# Patient Record
Sex: Male | Born: 1980 | Hispanic: Refuse to answer | Marital: Married
Health system: Midwestern US, Community
[De-identification: ages and names within clinical notes are randomized; demographics above are authoritative.]

## PROBLEM LIST (undated history)

## (undated) DIAGNOSIS — R569 Unspecified convulsions: Secondary | ICD-10-CM

## (undated) DIAGNOSIS — F1290 Cannabis use, unspecified, uncomplicated: Secondary | ICD-10-CM

---
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
## 2003-05-18 NOTE — ED Provider Notes (Signed)
[facility]                      EMERGENCY DEPARTMENT TREATMENT REPORT   ADMISSION   NAME:  Nicholas Cummings, Nicholas Cummings                      PT. LOCATION:     ER  WR16   MR #:         BILLING #: [id number]          DOS: 05/18/2003   TIME: 4:54 P   [id number]   cc:   Primary Physician:   CHIEF COMPLAINT:  Seizure.   HISTORY OF PRESENT ILLNESS:  This is a 23 year old male who presents after   having a seizure.  The patient was at girlfriend's house, initially had   just some twitching of the jaw and then had a grand mal seizure with biting   of the tongue.  Brought by paramedics, 5 mg of Valium IV given.  Had a   recurrence of the seizure and repeat 5 mg IV Valium given, total of 10 mg.   The patient vomited once at home earlier today and was complaining of a   headache.  No other complaints per family.   REVIEW OF SYSTEMS:   Unobtainable from the patient.  History from family.   CONSTITUTIONAL:  Positive fever.   RESPIRATORY:  No cough.   GASTROINTESTINAL:  Positive vomiting, no complaints of any abdominal pain.   NEUROLOGICAL:  Positive headache.   ENT:   Positive biting of the tongue.   All other systems negative.   PAST MEDICAL HISTORY:  Seizure disorder since brain surgery 2 years ago,   also history of skull fracture a month ago after a seizure, history of   prior renal failure a few months ago.   MEDICATIONS:  Dilantin 300 mg every other day and 200 mg every other day,   folic acid.  Mother states not always compliant with Dilantin.   ALLERGIES:  Unknown.   SOCIAL HISTORY:  Noncontributory.   PHYSICAL EXAMINATION:   VITAL SIGNS:  Blood pressure 159/67, pulse 138, respirations 40,   temperature 102.5, O2 saturation 100%.   GENERAL:  Well-developed, well-nourished male, snoring with nasal tube.   HEENT:  Eyes:  Conjunctivae clear, lids normal.  Pupils equal, symmetrical,   and normally reactive.   Blood on tongue.   NECK:   No masses palpable, no lymphadenopathy.    RESPIRATORY:  Clear and equal breath sounds.  No respiratory distress,   tachypnea, or accessory muscle use.   CARDIOVASCULAR:  Heart regular, without murmurs, gallops, rubs, or thrills.   PMI not displaced.   GI:  Abdomen soft, nontender, without complaint of pain to palpation.  No   hepatomegaly or splenomegaly.   SKIN:  No rash.   NEUROLOGICAL:  The patient unresponsive at this time, snoring   respirations.   CONTINUATION BY DR. Manson Passey:   IMPRESSION/MANAGEMENT PLAN:  As an acute illness posing a potential threat   to life or bodily function, this is a high-risk presentation necessitating   an immediate diagnostic evaluation.   The patient with a grand mal seizure   today and has history of evidently noncompliance.  He is not really   responsive.  He has a high fever.  Concern would certainly be sepsis.  A   fever of 103 seems high simply for a seizure.  We will attempt to get  records from [facility].   DIAGNOSTIC TESTING:  Chest x-ray read as possible right upper lobe   pneumonia.  He was started on Levaquin 500 mg IV.  His Dilantin level was   6.5.  I have loaded him with 500 mg Dilantin IV.  He has had no further   seizures.  White count was elevated around 25,000.  His electrolytes looked   okay.  BUN 5, creatinine 1.1.  Urine was clear.  Head CT showed thickening   of the sinuses but no obvious sign of subdural hematoma or abscess.   COURSE IN THE EMERGENCY DEPARTMENT:  I have spoken at great length with the   hospitalists.  They will be admitting the patient.  He will be going to the   Intensive Care Unit.  I have spoken with the mother, as well.  She states   it usually takes him a day and a half or so after a seizure to return to   his normal status.   PROCEDURE:  Critical care time including direct patient care, reviewing   medical record, evaluating results of diagnostic testing, discussions with   family members, and consulting with physicians:  40 minutes.   DIAGNOSES:          1.   Status epilepticus.      2. Right upper lobe pneumonia with high fever.      3. 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      4. Subtherapeutic Dilantin level.   DISPOSITION:  The patient admitted in serious condition.   Electronically Signed By:   Docia Furl, M.D. 05/23/2003 17:06   ____________________________   Docia Furl, M.D.   cd/jj  D:  05/18/2003  T:  05/18/2003  5:38 P   [id number]   DIANA A HOULE, PA-C

## 2003-05-18 NOTE — H&P (Signed)
[facility]                              HISTORY AND PHYSICAL                             Lisbeth Ply, M.D.   NAME:    Nicholas Cummings, Nicholas Cummings   MR #:    [id number]                    ADM DATE:        05/18/2003   BILLING  [id number]                   PT. LOCATION     ER  [id number]   #:   SS #     [id number]   Lisbeth Ply, M.D.   cc:    Lisbeth Ply, M.D.   Cheri Kearns TO:   Leanor Rubenstein, M.D.-[facility]   CHIEF COMPLAINT:     The patient has seizure at home and is currently   posterior ictal.   The majority of history is supplied by discussion with   his mom over the phone.   HISTORY OF PRESENT ILLNESS:   This is a 23 year old African-American male   with history of seizure disorder who has been noncompliant in the past with   multiple hospitalizations at [facility] because of   this reason.   He has a history of brain surgery in 2002 in [place] at   [facility].   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   Since then he has had   multiple seizures due to this, however, according to his mom patient has   been in denial and is noncompliant with his medication.   He is supposed to   be taking Dilantin 100 mg 3 times daily every other day according to his   mom.   There is a known history of meningitis in the past for which was   admitted to [facility] in June of 2004.    Upon unit   arrival the patient was noted to be post ictal.   He had required Valium   due to an acute onset of seizure which lasted for approximately 30-40   seconds, subsequently had a generalized seizure 2 minutes after requiring   multiple vague incision.    Subsequently a seizure was stopped after repeat   dose of Valium 5 mg IV.    The patient was therefore brought into the    hospital for further inpatient assessment and evaluation.   ER did CT scan   of the head which showed numerous skull fractures which may represent   craniotomy defect of the hardware in place and multiple fracture sites.   No acute underlying brain abnormalities seen at this time.     Extensive   opacification of the paranasal sinuses suggesting inflammatory disease or   question hematoma.   According to his Mom the patient has a recent history   of a fall resulting in a multiple fracture with was evaluated at [facility] most recently in January of 2005.   PAST MEDICAL HISTORY:  As stated above.   PAST SURGICAL HISTORY:    As stated above.   SOCIAL HISTORY:   The patient had a remote history of marijuana use,   however mom is unclear about whether he continues to do this or whether he   drinks or smokes.   Previously has been told to abstain from alcohol.   FAMILY HISTORY:   Noncontributory.   REVIEW OF SYSTEMS:   Currently not obtainable from the patient.   CURRENT MEDICATIONS:    The patient had been placed on Dilantin before   however his levels here is subtherapeutic.   ALLERGIES:    No known drug allergies.   PHYSICAL EXAMINATION:    VITAL SIGNS:   Temperature 102.5, respiratory rate   initially 40, pulse 138, blood pressure 159/67.   GENERAL:   The patient is a well-developed, well-nourished currently   somnolent but easily arousable in answering yes or no.    Does not remember   what happened this afternoon.   HEENT:   Pupils are equal and reactive.   Extraocular movements are intact.   Anicteric sclerae.   No ___  hemorrhage.    Nasal trumpet is in place in   the left naris.    Oropharynx not well visualized.   Dried blood was noted   on his teeth as well as on his lips.   NECK:   Is supple without a limited range of motion and no nuchal rigidity   noted.    No adenopathy, thyromegaly, carotid bruits.   CARDIOVASCULAR:   Regular rate and rhythm, mildly tachycardic.   No    murmurs, rubs or gallops.   CHEST:   Is clear to auscultation bilaterally without rhonchi, rubs or   wheezes.   ABDOMEN:   Soft, nontender, nondistended, no guarding or rebound.   EXTREMITIES:   Without peripheral cyanosis, clubbing or edema.   Distal   pulses are intact.   NEUROLOGIC:   The patient is moving all 4 extremities without difficulty.   Muscular strength in upper extremities 5/5.    Lower extremities   symmetrical now.   Muscle strength:   Patient had no difficulty moving around.   Overall   limited examination.   PERTINENT LABORATORY DATA:   Dilantin level is 6.3, sodium 137, potassium   3.5, chloride 103, bicarbonate 21, glucose 106, BUN 5, creatinine 1.1,   calcium 9.0, WBC 24.6, hemoglobin 12.7, hematocrit 36.8, platelet 288.   Urinalysis negative for nitrite, leukocytesterase, protein or glucose.   CT   scan results as above.   Chest x-ray suggestive of hazy density in the   right middle apex.    Atelectasis was not excluded or pneumonia could not   be entirely excluded according to radiologist dictation.    Based on his CT   scan findings according to the radiologist's dictation edema from trauma   possibly even early middle cerebral artery territory infarct on the right   had been completely excluded correlation with clinical data.   IMPRESSION:   1.      Acute seizure episode with known history of recurrent seizure   secondary to medication noncompliance.   Patient with prior history of   brain surgery as described above after which he has recurrent seizures.   He appears to be noncompliant and also in denial.    Currently he is fully   awakening from post ictal state.      2. History of brain surgery undergoing  extensive surgery in 2002.      3. Leukocytosis.   Suspect this is large acute phase reaction from        seizure doubt this is related to any acute infection,  however will        obtain cultures of urine as well as no cultures.   Doubt meningitis as         source of his seizure.   The patient has no nuchal rigidity.        Negative Brudzinski or Kernig sign.   PLAN:   The patient will be monitored in the ICU for now.   Continue with   hydration.   He has received a dose of Dilantin 500 mg IV.   Will repeat.   Will continue IV Dilantin, recheck his levels in the morning.   He is to   receive IV Levaquin x 1 dose in the ER which will be continued for now   empirically.   His electrolytes will be checked and repleted if necessary.   Also check urine drug screen p.r.n.   His case and condition was discussed   with his mom over the phone in great detail.    Continue supportive care.   Total critical time spent was close to 45-50 minutes.   Electronically Signed By:   Lisbeth Ply, M.D. 05/19/2003 11:28   ____________________________   Lisbeth Ply, M.D.   eb  D:  05/18/2003  T:  05/18/2003  8:06 P   [id number]

## 2003-05-20 NOTE — Consults (Signed)
[facility]                               CONSULTATION REPORT                        CONSULTANT:  Vladimir Crofts, M.D.   NAMEWendelyn Cummings   BILLING #:  [id number]             DATE OF CONSULT:     05/20/2003   MR #:       [id number]              ADM DATE:            05/18/2003   SS #        [id number]           PT. LOCATION:        [id number]   ATTENDING:  Lisbeth Ply, M.D.   cc:    Lisbeth Ply, M.D.          Vladimir Crofts, M.D.          Lorain Childes, M.D.   REASON FOR CONSULTATION:   A neurological consultation has been requested   by Dr. Threasa Heads for evaluation of seizure disorder.   Chart reviewed and patient examined.  Patient is somnolent and unable to   give any coherent and relevant history.   HISTORY OF PRESENT ILLNESS:   This is a 23 year old African-American male   who has a history of seizure disorder with a strong history of   noncompliance and apparently has had multiple admissions to [facility] for the same reason.  He has a history of craniotomy in   2002 at [facility] in [place] for unclear reasons and   apparently it was a very involved and long surgery requiring about 18   hours.  He also has a history of "meningitis" in June 2004 treated at   [facility].  He apparently had a witnessed seizure at   home and was brought into the emergency room in the postictal state.  He   received some Ativan for recurrent seizure activity.  His Dilantin level   was noted to be subtherapeutic at 6.3.  Drug screen was positive for THC.   Subsequently, he has been loaded with Dilantin.  There has been no   recurrence of any seizures but he has been agitated and received Haldol.   This morning, he has been lethargic to somnolent but arousable.   PAST MEDICAL HISTORY:   Otherwise is unremarkable.   FAMILY HISTORY:    Apparently noncontributory.    SOCIAL HISTORY:   Apparently is positive for marijuana use.   REVIEW OF SYSTEMS:   Cannot be obtained.   PHYSICAL EXAMINATION:  Reveals him to be lethargic to somnolent but   arousable.  There was no obvious aphasia or dysarthria.  He did follow   simple commands.   HEENT:  Head was normocephalic with craniotomy scar.   NECK:  No rigidity.   NEUROLOGIC:  Cranial nerves II-XII intact.  Motor examination did not   reveal any focal atrophy or other weakness.  Deep tendon reflexes were felt   to be symmetrical.  There were no involuntary movements.  He did respond to   pain bilaterally.  Babinski sign was  probably absent bilaterally.   PERTINENT DIAGNOSTIC STUDIES:  CT scan of the brain has shown multiple   craniotomy defects and questionable skull fractures and frontal   encephalomalacia without any acute intracranial lesion.   Dilantin level today is 27.  Electrolytes are unremarkable with the   exception of serum sodium of 131.  Serum calcium has ranged from 9.0 to 9.4   and magnesium is 1.7.  Creatinine is 1.1 and 1.2.  WBC count is elevated at   24,600.   IMPRESSION:      1. Seizure secondary to suprapubic Dilantin level which is secondary to         poor compliance in a patient with history of craniotomy and         meningitis.      2. Drug abuse.      3. No clear evidence of CNS infection.   RECOMMENDATIONS:     Will hold Dilantin today and avoid sedating   medications.  Will resume Dilantin when level drops into therapeutic range.   Will reevaluate him.   Thank you for allowing me to evaluate this patient.   _________________________________   Vladimir Crofts, M.D.   [place].Scarce  D:  05/20/2003  T:  05/20/2003 10:13 P   [id number]

## 2003-05-24 NOTE — Consults (Signed)
[facility]                               CONSULTATION REPORT                      CONSULTANT:  Libby Maw, M.D.   NAMEWendelyn Cummings   BILLING #:  [id number]             DATE OF CONSULT:     05/24/2003   MR #:       [id number]              ADM DATE:            05/18/2003   SS #        [id number]           PT. LOCATION:        [id number]   ATTENDING:  Lisbeth Ply, M.D.   cc:    Lisbeth Ply, M.D.          Libby Maw, M.D.   REFERRING PHYSICIAN:  Dr. Sherlon Handing   REASON FOR CONSULTATION:  Dr. Sherlon Handing has asked our group to provide   evaluation and management services for antimicrobial therapy.   ASSESSMENT:      1. Partially treated meningitis, unable to tell at this time if this is         a bacterial or viral process.            a. We would not discontinue acyclovir pending the return of herpes         simplex PCR which is outstanding.      2. We recommend obtaining an MRI and MRA to evaluate for possible         vascular abnormalities as the spinal fluid was clearly hemorrhagic         and the cerebrospinal fluid white cell to red cell ratio far exceeded         that of the peripheral blood.      3. Seizure disorder.      4. History of old skull fracture.      5. Encephalomalacia, probably related to his previous craniotomy dating         to 2003.      6. History of acute renal failure in 2004, resolved.      7. History of previous bacterial meningitis.   Of note, there is no evidence of cerebrospinal fluid leak, so the current   antimicrobial regimen of vancomycin, ceftriaxone and acyclovir should be   sufficient.  For the completion of his antimicrobial therapy, the patient   can probably receive ceftriaxone as a single agent assuming that the herpes   simplex PCR is negative.   HISTORY OF THE PRESENT ILLNESS:  Mr. Nicholas Cummings is a 23 year old   African-American male with a history of seizures who presented to the    hospital with fever and seizures on the day of admission.  Because of his   initial presentation with 2 grand mal seizures in rapid sequence, the   attention of the emergency room personnel was diverted from the possibility   of meningitis.  It was not until hospital day number 3 that a lumbar   puncture was entertained but because of technical difficulties was not   actually performed until it was accomplished in radiology  under   fluoroscopic guidance.  The spinal fluid obtained was bloody and had   elevated protein and low glucose with significant elevation of white blood   cell count.  There is no report in the chart of bacterial directogens which   were ordered nor is a herpes simplex PCR available.   The patient was receiving antimicrobial therapy from 04/09 prior to the   lumbar puncture on 04/11.  Because of this, the cultures are likely to   remain sterile.  While the patient does not recall, he has obviously had   skull fractures in the past by computer tomographic examinations on   admission.  Additionally, he has had a craniotomy for polyps.  It is   unclear of exactly the nature of these polyps, but apparently there was a   craniotomy performed in the right frontal lobe.  He also gives a history of   bacterial meningitis for which he was hospitalized at [facility], but those records are not available for review.   REVIEW OF SYSTEMS:  At the present time, his sensorium is clear.  He does   not have a headache.  He does not have nasal discharge.  He does not have   tinnitus or visual acuity changes.  He has had no further seizures since   admission.   He denies cough, shortness of breath, chest pain, diarrhea, abdominal pain,   or rashes.  He has had no tick bites and no mosquito bites.  He has had no   travel history.   MEDICATIONS:  Acyclovir 600 milligrams IV q.8h., ceftriaxone 2 grams IV   q.12h., and vancomycin 1 gram IV q.8h. together with Dilantin.    ALLERGIES:   The patient has no known drug allergies or hypersensitivity   reactions.   FAMILY AND SOCIAL HISTORY:  Unremarkable.   PHYSICAL EXAMINATION:  This is an alert and oriented African-American male,   5 feet, 9 inches, 61 kilograms.  Temperature is 100.1.  Blood pressure is   127/59, pulse 79 and regular, and respirations are 20.   HEENT:  Pupils equal, round and reactive to light.  Sclerae are clear.   Funduscopic examination reveals no exudates or hemorrhages.  Disc margins   are sharp.  Both tympanic membranes are well visualized and are mobile and   grade 0.  There is no mastoid area of tenderness or redness.  There is no   sinus area of tenderness or nasal discharge.   NECK:  Supple.  There is some thrush noted on the tongue.  Gag is intact.   CHEST:  Clear.   HEART:  Regular rate and rhythm without murmurs, clicks, rubs or gallops.   ABDOMEN:  Soft and nontender without hepatosplenomegaly or masses.   EXTREMITIES:  Without edema.   NEUROLOGIC:  Cranial nerves II through XII are intact.  Motor is 5/5 in all   groups.  Deep tendon reflexes are 2+ and symmetrical.  Gait is steady.   Sensory examination to light touch is intact.   LABORATORY DATA:  Serum electrolytes are within the limits of normal.  BUN   is 5 and creatinine is 0.8.  The white blood cell count is 11,700,   hemoglobin and hematocrit 12.5 and 36.8, platelet count 310,000.   Differential shows 64% segmented forms, 23% lymphocytes, 8% monocytes and   4% eosinophils.  The CSF glucose is 31, protein 117, and there are 10,369   RBCs and 5,000 WBCs  with 68% segmented forms, 16% monocytes, 2% lymphocytes   and 14% other cells seen.  Cultures remain sterile.  Computed tomographic   examination of the head shows right frontal encephalomalacia, evidence of   multiple old fractures, and no intracerebral or intracerebellar abscesses.   The ventricles are well maintained.   Electronically Signed By:   Libby Maw, M.D. 05/26/2003 08:37    _________________________________   Libby Maw, M.D.   lo  D:  05/24/2003  T:  05/25/2003  7:39 A   [id number]

## 2003-05-26 NOTE — Discharge Summary (Signed)
[facility]                                DISCHARGE SUMMARY   NAM BRENNER, VISCONTI   E:   MR  [id number]                          ADM DATE:      05/18/2003   #:   Lindley Magnus  [id number]                       DIS DATE:      05/26/2003   #   Lisbeth Ply, M.D.   cc:    Lisbeth Ply, M.D.          Libby Maw, M.D.   DISCHARGE DIAGNOSES:   1.     Seizure disorder, previous history of recurrent seizures secondary      to medication noncompliance.   2.     Acute meningitis.   3.     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   4.     Remote history of bacterial meningitis.   5.     Medication noncompliance.   6.     History of marijuana use.   DISCHARGE MEDICATIONS:   1.     Dilantin 100 mg t.i.d.   2.     Ceftriaxone to be continued until May 30, 2003, dosage 2 grams IV      q.12 hours.   3.     Tylenol 325 mg q.6 hours as needed.   CONSULTANTS:   1.     Billy B.Nederland, M.D. -Infectious Disease.   2.     Donald Pore, M.D. -Neurologist.   PROCEDURES:   1.     MRI of the brain with and without contrast suggested post surgical      change, consistent with prior left frontal and right temporal      craniotomy.  Enhancement of the frontal left meninges is seen.  No      definite signal abnormality to support seizure focus, extensive      paranasal sinus surgery noted, mild encephalomalacia of the frontal lobe      suggesting new enhancing parietal mass.   2.     Status post PICC line placement May 26, 2003.   3.     CAT scan from May 21, 2003, craniotomy changes with evidence of      _________ nondepressed fracture and small area of encephalomalacia in      the right frontal pole.   HISTORY AND HOSPITAL COURSE:  This is a 23 year old African-American male   with history of seizure disorder with prior history of medication   noncompliance who has been followed at [facility] in [place].  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  Since then, he has   had multiple seizures and the reason for recurrence of seizure were felt to   be related to his medication noncompliance according to his mother.  The   patient was admitted for inpatient assessment, evaluation and management of   his seizures.  He was admitted to the ICU and was placed on IV Dilantin and Dilantin   levels were serially monitored.  Neurology consultation was obtained for   further management during his hospitalization.  He was empirically covered   with IV Levaquin initially.  In addition to Levaquin, clindamycin was added   due to continue elevation of WBC with Bandemia.  This was subsequently   switched over to vancomycin and ceftriaxone.  He had no further recurrence   of seizures while he was hospitalized.  He can continue IV Dilantin   therapy, which was subsequently transitioned over to oral.   On April 10th a lumbar puncture was attempted.  The patient was very   restless and LP was recommended under fluoroscopy.  His LP suggested a   significant elevation of protein, but decreased glucose and serum antigen   suggested no evidence of bacterial antibody positivity.  In addition to his   antibiotics, Acyclovir was also started.  A chest VPCR was obtained which   was negative.   An Infectious Disease consultation was obtained, recommending continuing   antibiotic therapy.  Acyclovir and vancomycin were discontinued on the day   of discharge providing the results of HSV, PCR negative.  Blood cultures   remained negative to growth.  Acyclovir and vancomycin were discontinued on   the day of discharge and he is currently been receiving ceftriaxone 2 grams   q.12 hours, which will be continued until May 30, 2003.  He will continue   with outpatient IV antibiotic infusion.    He remains in clinical stable condition at the present.  He has been   thoroughly advised to comply with medications and followup at [facility]   for further outpatient monitoring of seizure and routine blood drawl and   level check and adjustment of his medications as necessary.  He was also   provided instructions to followup with Dr. Alferd Patee, as well.   PERTINENT LABORATORY DATA:  On the day of discharge, PT/INR 16.3, 1.3   respectively.  WBC 6.9, hemoglobin 11.6, hematocrit 33.7, platelets 345.   Electrolytes from May 24, 2003, sodium 139, potassium 3.7, chloride 101,   bicarb 28, glucose 95, BUN 5, creatinine 0.8, calcium 8.9.  Dilantin level   on May 21, 2003 17.3.  CFS glucose 31, protein 117.3 mg per dL, WBC CFS   7829, RBC count 368, bacterial antigen panel negative.  AFB stain negative.   Gram stain no organisms noted.  No epithelial cells.  No growth to date.   Urine drug screen is positive for THC.   Total time for discharge, including evaluation, instructions to the   patient, reviewing records, dictation and summary, discharge orders and   prescriptions was approximately 25 minutes.   Electronically Signed By:   Lisbeth Ply, M.D. 06/08/2003 16:13   ________________________________   Lisbeth Ply, M.D.   [place]ch  D:  05/26/2003  T:  06/05/2003  7:22 A   [id number]

## 2017-06-06 ENCOUNTER — Inpatient Hospital Stay
Admit: 2017-06-06 | Discharge: 2017-06-09 | Disposition: A | Discharge status: Home / Self Care | Payer: Medicare (Managed Care) | Attending: Hospitalist | Admitting: Hospitalist

## 2017-06-06 ENCOUNTER — Emergency Department: Admit: 2017-06-06 | Payer: Medicare (Managed Care)

## 2017-06-06 ENCOUNTER — Emergency Department: Admit: 2017-06-07 | Payer: Medicare (Managed Care)

## 2017-06-06 DIAGNOSIS — G40401 Other generalized epilepsy and epileptic syndromes, not intractable, with status epilepticus: Principal | ICD-10-CM

## 2017-06-06 LAB — BLOOD GAS, ARTERIAL POC
Base deficit (POC): 8 mmol/L
Base deficit (POC): 8 mmol/L
FIO2 (POC): 45 %
FIO2 (POC): 45 %
HCO3 (POC): 19.1 MMOL/L — ABNORMAL LOW (ref 22–26)
HCO3 (POC): 19.7 MMOL/L — ABNORMAL LOW (ref 22–26)
Inspiratory Time: 0.9 s
Inspiratory Time: 0.9 s
Nitric-ppm (POC): 0 [ppm]
Nitric-ppm (POC): 0 [ppm]
PEEP/CPAP (POC): 5 cmH2O
PEEP/CPAP (POC): 5 cmH2O
PIP (POC): 14
PIP (POC): 27
Patient temp.: 37
Patient temp.: 37
Set Rate: 14 {beats}/min
Set Rate: 14 {beats}/min
Tidal volume: 50 ml
Tidal volume: 500 ml
Total resp. rate: 25
pCO2 (POC): 43.9 MMHG (ref 35.0–45.0)
pCO2 (POC): 47.3 MMHG — ABNORMAL HIGH (ref 35.0–45.0)
pH (POC): 7.229 — CL (ref 7.35–7.45)
pH (POC): 7.247 — CL (ref 7.35–7.45)
pO2 (POC): 41 MMHG — CL (ref 80–100)
pO2 (POC): 43 MMHG — CL (ref 80–100)
sO2 (POC): 67 % — ABNORMAL LOW (ref 92–97)
sO2 (POC): 71 % — ABNORMAL LOW (ref 92–97)

## 2017-06-06 LAB — CBC WITH AUTOMATED DIFF
ABS. BASOPHILS: 0.1 10*3/uL (ref 0.0–0.1)
ABS. EOSINOPHILS: 0.4 10*3/uL (ref 0.0–0.4)
ABS. LYMPHOCYTES: 4.8 10*3/uL — ABNORMAL HIGH (ref 0.9–3.6)
ABS. MONOCYTES: 0.6 10*3/uL (ref 0.05–1.2)
ABS. NEUTROPHILS: 6 10*3/uL (ref 1.8–8.0)
BASOPHILS: 0 % (ref 0–2)
EOSINOPHILS: 3 % (ref 0–5)
HCT: 41.1 % (ref 36.0–48.0)
HGB: 13.5 g/dL (ref 13.0–16.0)
LYMPHOCYTES: 41 % (ref 21–52)
MCH: 29 PG (ref 24.0–34.0)
MCHC: 32.8 g/dL (ref 31.0–37.0)
MCV: 88.4 FL (ref 74.0–97.0)
MONOCYTES: 5 % (ref 3–10)
MPV: 10.7 FL (ref 9.2–11.8)
NEUTROPHILS: 51 % (ref 40–73)
PLATELET: 273 10*3/uL (ref 135–420)
RBC: 4.65 M/uL — ABNORMAL LOW (ref 4.70–5.50)
RDW: 14.2 % (ref 11.6–14.5)
WBC: 11.9 10*3/uL (ref 4.6–13.2)

## 2017-06-06 LAB — METABOLIC PANEL, COMPREHENSIVE
A-G Ratio: 0.9 (ref 0.8–1.7)
ALT (SGPT): 26 U/L (ref 16–61)
AST (SGOT): 33 U/L (ref 15–37)
Albumin: 3.9 g/dL (ref 3.4–5.0)
Alk. phosphatase: 118 U/L — ABNORMAL HIGH (ref 45–117)
Anion gap: 20 mmol/L — ABNORMAL HIGH (ref 3.0–18)
BUN/Creatinine ratio: 7 — ABNORMAL LOW (ref 12–20)
BUN: 9 MG/DL (ref 7.0–18)
Bilirubin, total: 0.2 MG/DL (ref 0.2–1.0)
CO2: 11 mmol/L — ABNORMAL LOW (ref 21–32)
Calcium: 9.2 MG/DL (ref 8.5–10.1)
Chloride: 111 mmol/L — ABNORMAL HIGH (ref 100–108)
Creatinine: 1.32 MG/DL — ABNORMAL HIGH (ref 0.6–1.3)
GFR est AA: >60 mL/min/{1.73_m2} (ref >60)
GFR est non-AA: >60 mL/min/{1.73_m2} (ref >60)
Globulin: 4.5 g/dL — ABNORMAL HIGH (ref 2.0–4.0)
Glucose: 90 mg/dL (ref 74–99)
Potassium: 3.8 mmol/L (ref 3.5–5.5)
Protein, total: 8.4 g/dL — ABNORMAL HIGH (ref 6.4–8.2)
Sodium: 142 mmol/L (ref 136–145)

## 2017-06-06 MED ORDER — PROPOFOL INFUSION
10 mg/mL | INTRAVENOUS | Status: DC
Start: 2017-06-06 — End: 2017-06-06

## 2017-06-06 MED ORDER — LEVETIRACETAM IN SOD CHLORIDE (ISO-OSM) 500 MG/100 ML IV PIGGY BACK
500 mg/100 mL | Freq: Two times a day (BID) | INTRAVENOUS | Status: DC
Start: 2017-06-06 — End: 2017-06-06

## 2017-06-06 MED ORDER — SODIUM CHLORIDE 0.9 % IV
500 mg/5 mL | Freq: Two times a day (BID) | INTRAVENOUS | Status: DC
Start: 2017-06-06 — End: 2017-06-08
  Administered 2017-06-07 – 2017-06-08 (×3): via INTRAVENOUS

## 2017-06-06 MED ORDER — LEVETIRACETAM IN SOD CHLORIDE (ISO-OSM) 500 MG/100 ML IV PIGGY BACK
500 mg/100 mL | Freq: Once | INTRAVENOUS | Status: DC
Start: 2017-06-06 — End: 2017-06-06

## 2017-06-06 MED ORDER — MIDAZOLAM 1 MG/ML IJ SOLN
1 mg/mL | Freq: Once | INTRAMUSCULAR | Status: AC
Start: 2017-06-06 — End: 2017-06-06

## 2017-06-06 MED ORDER — ETOMIDATE 2 MG/ML IV SOLN
2 mg/mL | INTRAVENOUS | Status: AC
Start: 2017-06-06 — End: 2017-06-06
  Administered 2017-06-06: 22:00:00 via INTRAVENOUS

## 2017-06-06 MED ORDER — SUCCINYLCHOLINE CHLORIDE 20 MG/ML INJECTION
20 mg/mL | INTRAMUSCULAR | Status: DC
Start: 2017-06-06 — End: 2017-06-06

## 2017-06-06 MED ORDER — MIDAZOLAM 1 MG/ML IJ SOLN
1 mg/mL | INTRAMUSCULAR | Status: AC
Start: 2017-06-06 — End: 2017-06-07

## 2017-06-06 MED ORDER — OXCARBAZEPINE 300 MG/5 ML ORAL SUSP
300 mg/5 mL (60 mg/mL) | Freq: Two times a day (BID) | ORAL | Status: DC
Start: 2017-06-06 — End: 2017-06-08
  Administered 2017-06-06 – 2017-06-08 (×4): via GASTROSTOMY

## 2017-06-06 MED ORDER — PROPOFOL 10 MG/ML IV EMUL
10 mg/mL | INTRAVENOUS | Status: AC
Start: 2017-06-06 — End: 2017-06-06
  Administered 2017-06-06: 22:00:00 via INTRAVENOUS

## 2017-06-06 MED ORDER — MIDAZOLAM 1 MG/ML IJ SOLN
1 mg/mL | INTRAMUSCULAR | Status: AC
Start: 2017-06-06 — End: 2017-06-07
  Administered 2017-06-06: 21:00:00 via INTRAVENOUS

## 2017-06-06 MED ORDER — LEVETIRACETAM 500 MG/5 ML IV SOLN
5005 mg/5 mL | Freq: Once | INTRAVENOUS | Status: AC
Start: 2017-06-06 — End: 2017-06-06
  Administered 2017-06-07: via INTRAVENOUS

## 2017-06-06 MED ORDER — MIDAZOLAM 1 MG/ML IJ SOLN
1 mg/mL | INTRAMUSCULAR | Status: AC
Start: 2017-06-06 — End: 2017-06-06
  Administered 2017-06-06: 21:00:00 via INTRAVENOUS

## 2017-06-06 MED ORDER — PROPOFOL INFUSION
10 mg/mL | INTRAVENOUS | Status: DC
Start: 2017-06-06 — End: 2017-06-07
  Administered 2017-06-06 – 2017-06-07 (×13): via INTRAVENOUS

## 2017-06-06 MED ORDER — PROPOFOL 10 MG/ML IV EMUL
10 mg/mL | INTRAVENOUS | Status: DC
Start: 2017-06-06 — End: 2017-06-06

## 2017-06-06 MED ORDER — SUCCINYLCHOLINE CHLORIDE 20 MG/ML INJECTION
20 mg/mL | INTRAMUSCULAR | Status: AC
Start: 2017-06-06 — End: 2017-06-06
  Administered 2017-06-06: 22:00:00 via INTRAVENOUS

## 2017-06-06 MED ORDER — SODIUM CHLORIDE 0.9 % IV
500 mg/5 mL | Freq: Once | INTRAVENOUS | Status: AC
Start: 2017-06-06 — End: 2017-06-06
  Administered 2017-06-06: 21:00:00 via INTRAVENOUS

## 2017-06-06 MED FILL — MIDAZOLAM 1 MG/ML IJ SOLN: 1 mg/mL | INTRAMUSCULAR | Qty: 4

## 2017-06-06 MED FILL — LEVETIRACETAM 500 MG/5 ML IV SOLN: 500 mg/5 mL | INTRAVENOUS | Qty: 17.5

## 2017-06-06 MED FILL — MIDAZOLAM 1 MG/ML IJ SOLN: 1 mg/mL | INTRAMUSCULAR | Qty: 2

## 2017-06-06 MED FILL — LEVETIRACETAM 500 MG/5 ML IV SOLN: 500 mg/5 mL | INTRAVENOUS | Qty: 20

## 2017-06-06 MED FILL — PROPOFOL 10 MG/ML IV EMUL: 10 mg/mL | INTRAVENOUS | Qty: 100

## 2017-06-06 MED FILL — OXCARBAZEPINE 300 MG/5 ML ORAL SUSP: 300 mg/5 mL (60 mg/mL) | ORAL | Qty: 10

## 2017-06-06 NOTE — ED Provider Notes (Signed)
EMERGENCY DEPARTMENT HISTORY AND PHYSICAL EXAM    4:36 PM  Date: 06/06/2017  Patient Name: Nicholas Cummings    History of Presenting Illness     Chief Complaint   Patient presents with   ??? Seizure        History Provided By: EMS    HPI: CLARON ROSENCRANS is a 37 y.o. male with history of seizures here for seizures.    History is limited as patient is actively seizing.    Per EMS he is on Keppra.  Family witnessed him seizing and called for EMS, where he had another seizure during transport.  They arrived before he could be given benzos, he had another seizure in the critical care bay.  He had a normal blood sugar with EMS.         PCP: None    Past History     Past Medical History:  Past Medical History:   Diagnosis Date   ??? Seizures ([place])        Past Surgical History:  History reviewed. No pertinent surgical history.    Family History:  History reviewed. No pertinent family history.    Social History:  Social History     Tobacco Use   ??? Smoking status: Unknown If Ever Smoked   Substance Use Topics   ??? Alcohol use: Not on file   ??? Drug use: Not on file       Allergies:  Not on File    Review of Systems     ROS unable to be completed due to acuity of situation    Physical Exam     Patient Vitals for the past 12 hrs:   Temp Pulse Resp BP SpO2   06/06/17 1920 ??? 82 ??? (!) 129/91 100 %   06/06/17 1912 ??? 98 17 ??? 100 %   06/06/17 1905 ??? 86 ??? 116/76 100 %   06/06/17 1900 ??? 90 ??? ??? 100 %   06/06/17 1855 ??? 97 ??? 124/55 100 %   06/06/17 1830 ??? (!) 105 ??? 122/72 100 %   06/06/17 1805 ??? ??? 22 ??? 100 %   06/06/17 1800 ??? 85 18 125/78 97 %   06/06/17 1745 ??? 83 23 117/70 96 %   06/06/17 1730 ??? 80 24 122/71 96 %   06/06/17 1700 ??? (!) 59 ??? 110/61 97 %   06/06/17 1645 ??? (!) 102 21 112/68 ???   06/06/17 1635 98.9 ??F (37.2 ??C) ??? ??? ??? ???   06/06/17 1630 ??? 75 23 133/65 94 %       Physical Exam   Constitutional: Appears well-developed. Is not diaphoretic.   Eyes: Conjunctiva clear, EOMI  Head: Normocephalic and atraumatic.    Neck: Normal range of motion. No stridor or tracheal deviation.   Cardiovascular: Intact distal pulses.  Pulmonary/Chest: Effort normal and no respiratory distress.   Abdominal: Non distended.   Musculoskeletal: Normal range of motion. Exhibits no tenderness.   Neurological: Active tonic-clonic seizures  Skin: Skin is warm and dry.   Nursing note and vitals reviewed.    Diagnostic Study Results     Labs -  Recent Results (from the past 12 hour(s))   CBC WITH AUTOMATED DIFF    Collection Time: 06/06/17  5:00 PM   Result Value Ref Range    WBC 11.9 4.6 - 13.2 K/uL    RBC 4.65 (L) 4.70 - 5.50 M/uL    HGB 13.5 13.0 - 16.0 g/dL  HCT 41.1 36.0 - 48.0 %    MCV 88.4 74.0 - 97.0 FL    MCH 29.0 24.0 - 34.0 PG    MCHC 32.8 31.0 - 37.0 g/dL    RDW 14.2 11.6 - 14.5 %    PLATELET 273 135 - 420 K/uL    MPV 10.7 9.2 - 11.8 FL    NEUTROPHILS 51 40 - 73 %    LYMPHOCYTES 41 21 - 52 %    MONOCYTES 5 3 - 10 %    EOSINOPHILS 3 0 - 5 %    BASOPHILS 0 0 - 2 %    ABS. NEUTROPHILS 6.0 1.8 - 8.0 K/UL    ABS. LYMPHOCYTES 4.8 (H) 0.9 - 3.6 K/UL    ABS. MONOCYTES 0.6 0.05 - 1.2 K/UL    ABS. EOSINOPHILS 0.4 0.0 - 0.4 K/UL    ABS. BASOPHILS 0.1 0.0 - 0.1 K/UL    DF AUTOMATED     METABOLIC PANEL, COMPREHENSIVE    Collection Time: 06/06/17  5:00 PM   Result Value Ref Range    Sodium 142 136 - 145 mmol/L    Potassium 3.8 3.5 - 5.5 mmol/L    Chloride 111 (H) 100 - 108 mmol/L    CO2 11 (L) 21 - 32 mmol/L    Anion gap 20 (H) 3.0 - 18 mmol/L    Glucose 90 74 - 99 mg/dL    BUN 9 7.0 - 18 MG/DL    Creatinine 1.32 (H) 0.6 - 1.3 MG/DL    BUN/Creatinine ratio 7 (L) 12 - 20      GFR est AA >60 >60 ml/min/1.41m    GFR est non-AA >60 >60 ml/min/1.767m   Calcium 9.2 8.5 - 10.1 MG/DL    Bilirubin, total 0.2 0.2 - 1.0 MG/DL    ALT (SGPT) 26 16 - 61 U/L    AST (SGOT) 33 15 - 37 U/L    Alk. phosphatase 118 (H) 45 - 117 U/L    Protein, total 8.4 (H) 6.4 - 8.2 g/dL    Albumin 3.9 3.4 - 5.0 g/dL    Globulin 4.5 (H) 2.0 - 4.0 g/dL    A-G Ratio 0.9 0.8 - 1.7      POC G3    Collection Time: 06/06/17  7:31 PM   Result Value Ref Range    Device: VENT      FIO2 (POC) 45 %    pH (POC) 7.229 (LL) 7.35 - 7.45      pCO2 (POC) 47.3 (H) 35.0 - 45.0 MMHG    pO2 (POC) 41 (LL) 80 - 100 MMHG    HCO3 (POC) 19.7 (L) 22 - 26 MMOL/L    sO2 (POC) 67 (L) 92 - 97 %    Base deficit (POC) 8 mmol/L    Mode ASSIST CONTROL      Tidal volume 500 ml    Set Rate 14 bpm    PEEP/CPAP (POC) 5.0 cmH2O    PIP (POC) 27      Allens test (POC) N/A      Inspiratory Time 0.9 sec    Nitric-ppm (POC) 0 ppm    Site RIGHT RADIAL      Patient temp. 37.0      Specimen type (POC) ARTERIAL      Performed by LaZoe Lan   Volume control plus YES     POC G3    Collection Time: 06/06/17  7:41 PM   Result Value Ref Range  Device: VENT      FIO2 (POC) 45 %    pH (POC) 7.247 (LL) 7.35 - 7.45      pCO2 (POC) 43.9 35.0 - 45.0 MMHG    pO2 (POC) 43 (LL) 80 - 100 MMHG    HCO3 (POC) 19.1 (L) 22 - 26 MMOL/L    sO2 (POC) 71 (L) 92 - 97 %    Base deficit (POC) 8 mmol/L    Mode ASSIST CONTROL      Tidal volume 50 ml    Set Rate 14 bpm    PEEP/CPAP (POC) 5.0 cmH2O    PIP (POC) 14      Allens test (POC) N/A      Inspiratory Time 0.9 sec    Nitric-ppm (POC) 0 ppm    Total resp. rate 25      Site RIGHT RADIAL      Patient temp. 37.0      Specimen type (POC) ARTERIAL      Performed by Zoe Lan     Volume control plus YES         Radiologic Studies -   Ct Head Wo Cont    Result Date: 06/06/2017  IMPRESSION: No acute finding.  Bifrontal stable encephalomalacia and chronic white matter changes.  Prior frontal craniotomy.  There is no hemorrhage, mass, nor acute infarct. Report provided to the emergency department at 1729 hrs.    Xr Chest Port    Result Date: 06/06/2017  IMPRESSION: Intubation as above. Suspected mild perihilar interstitial infiltrate/edema. No consolidation.        Medical Decision Making     ED Course: Progress Notes, Reevaluation, and Consults:    4:36 PM???patient with active seizures here.  IV line established and 2  milligrams of IV Versed was given.  Airway was protected during his seizure and light suctioning was applied, a NPA placed, and nasal cannula O2 started.  Patient 70 kg also spoke with pharmacy about a loading dose of Keppra.    5:05 PM???patient with another seizure now with tonic-clonic movements.  This is while he was getting IV Keppra so another 5 mg of IV Versed was given.  Patient is now being wheeled to a CT that that he is postictal???will evaluate for another subarachnoid hemorrhage but a little less likely given the prior episodes due to trauma.    Reviewed his last admission records.  It appears that he has had intractable seizures since episode of meningitis when he was 74.  He was last discharged from [facility] on Trileptal 450 and Keppra 1500 after a fall causing a subarachnoid hemorrhage.  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    6:30 PM???spoke with Dr. Loleta Books of neurology.  She recommends giving additional Keppra here and starting him on 1500 mg twice daily, as well as continuing the Trileptal via OG tube.     8 PM??? patient with some agitation as the line was not working for his propofol.  He was restated with IV push the propofol as well as IV Ativan.    8:15 PM???spoke with Dr. Lennette Bihari sun of the ICU.  He has sepsis patient for admission.      Provider Notes (Medical Decision Making): Patient with history of severe seizures followed by [facility] neurology.  He arrives after 2 back to back episodes and has 3 more while he is here.  Patient failed several rounds of IV benzos and IV loading of his Keppra.  He  was intubated for airway protection which was difficult due to airway edema.  He has been sedated with propofol and benzos now, and he will be admitted to the ICU for further evaluation by neurology.    Procedures: Intubation  Date/Time: 06/06/2017 6:20 PM  Performed by: Marilynn Latino, MD  Authorized by: Marilynn Latino, MD     Consent:     Consent obtained:  Emergent situation   Pre-procedure details:     Patient status:  Unresponsive    Pretreatment medications:  None    Paralytics:  Succinylcholine  Procedure details:     Preoxygenation:  Nonrebreather mask    CPR in progress: no      Intubation method:  Oral    Oral intubation technique:  Video-assisted    Laryngoscope blade:  Mac 3    Tube size (mm):  7.5    Tube type:  Cuffed    Number of attempts:  2    Ventilation between attempts: yes      Cricoid pressure: no      Tube visualized through cords: yes    Placement assessment:     ETT to lip:  26    Tube secured with:  ETT holder    Breath sounds:  Equal    Placement verification: chest rise, condensation, CXR verification, direct visualization and equal breath sounds      CXR findings:  ETT in proper place  Post-procedure details:     Patient tolerance of procedure:  Tolerated well, no immediate complications  Comments:      Difficult due to secretions and edematous airway. No desaturations throughout        Critical Care Time: Critical Care Time:  The services I provided to this patient were to treat and/or prevent clinically significant deterioration that could result in the failure of one or more body systems and/or organ systems due to status epilepticus.    Services included the following:  -reviewing nursing notes and old charts  -vital sign assessments  -direct patient care  -medication orders and management  -interpreting and reviewing diagnostic studies/labs  -re-evaluations  -documentation time    Aggregate critical care time was 80 minutes, which includes only time during which I was engaged in work directly related to the patient's care as described above, whether I was at bedside or elsewhere in the Emergency Department. It did not include time spent performing other reported procedures or the services of residents, students, nurses, or advance practice providers.       Marilynn Latino, MD    8:18 PM      Vital Signs-Reviewed the patient's vital signs. Reviewed pt's pulse ox  reading.     Records Reviewed: Nursing Notes and Old Medical Records (Time of Review: 4:36 PM)  -I am the first provider for this patient.  -I reviewed the vital signs, available nursing notes, past medical history, past surgical history, family history and social history.    Current Facility-Administered Medications   Medication Dose Route Frequency Provider Last Rate Last Dose   ??? LORazepam (ATIVAN) 2 mg/mL injection            ??? midazolam (VERSED) 1 mg/mL injection            ??? OXcarbazepine (TRILEPTAL) 300 mg/5 mL (60 mg/mL) oral suspension 450 mg  450 mg Per G Tube Q12H Marilynn Latino, MD       ??? levETIRAcetam (KEPPRA) 2,000 mg in 0.9% sodium chloride 250 mL IVPB  2,000 mg  IntraVENous Alden Benjamin, Clare Gandy, MD       ??? [START ON 06/07/2017] levETIRAcetam (KEPPRA) 1,500 mg in 0.9% sodium chloride 100 mL IVPB  1,500 mg IntraVENous Q12H Marilynn Latino, MD       ??? propofol (DIPRIVAN) infusion  0-50 mcg/kg/min IntraVENous TITRATE Marilynn Latino, MD 16.8 mL/hr at 06/06/17 2000 40 mcg/kg/min at 06/06/17 2000        Diagnosis     Clinical Impression:   1. Status epilepticus ([place])      Disposition: Admit to ICU

## 2017-06-06 NOTE — Progress Notes (Signed)
PCCM Follow-up    Patient seen and evaluated in ED.  Chart reviewed including labs and imaging.  Sedated on propofol. Has intermittent myoclonic jerking of extremities. Reportedly had generalized tonic-clonic seizures earlier requiring intubation for airway protection. Per chart review, intubation was difficult airway due to edema.    Continue propofol; may start versed gtt  Neurology consulted -follow recommendations: keppra, trileptal  VAP bundle  Cass tube to suction at 20-30 cm Hg.  Maintain Cass tube with 5-35ml air every 4 hours  Routine oral care every 4 hours  Daily sedation holiday and SBT evaluation starting at 6.00am.  IVF hydration  Update family.    Blenda Bridegroom V, PA-C  11:40 PM

## 2017-06-06 NOTE — ED Triage Notes (Signed)
Ems trans from home c/o witnessed seizure

## 2017-06-06 NOTE — ED Notes (Signed)
TRANSFER - OUT REPORT:    Verbal report given to Joletta, RN (name) on Nicholas Cummings  being transferred to ICU (unit) for routine progression of care       Report consisted of patient???s Situation, Background, Assessment and   Recommendations(SBAR).     Information from the following report(s) SBAR, ED Summary, [facility] and Recent Results was reviewed with the receiving nurse.    Lines:   Peripheral IV 06/06/17 Right Antecubital (Active)   Site Assessment Clean, dry, & intact 06/06/2017  4:34 PM   Phlebitis Assessment 0 06/06/2017  4:34 PM   Infiltration Assessment 0 06/06/2017  4:34 PM   Dressing Status Clean, dry, & intact 06/06/2017  4:34 PM       Peripheral IV 06/06/17 Left Forearm (Active)   Site Assessment Clean, dry, & intact 06/06/2017  7:00 PM   Phlebitis Assessment 0 06/06/2017  7:00 PM   Infiltration Assessment 0 06/06/2017  7:00 PM   Dressing Status Clean, dry, & intact 06/06/2017  7:00 PM       Peripheral IV 06/06/17 Left External jugular (Active)   Site Assessment Clean, dry, & intact 06/06/2017 11:36 PM   Phlebitis Assessment 0 06/06/2017 11:36 PM   Infiltration Assessment 0 06/06/2017 11:36 PM   Dressing Status Clean, dry, & intact 06/06/2017 11:36 PM   Dressing Type Transparent 06/06/2017 11:36 PM        Opportunity for questions and clarification was provided.      Patient transported with:   Respiratory and ED Nurse

## 2017-06-06 NOTE — Other (Signed)
There is a good possibility that the blood gases were mixed venous. Nicholas Cummings and  I both made arterial and blood flowed as arterial and appeared to be arterial.  Dr. York Grice was satisfied that the Sao2 was 100%. I will administer quality respiratory care until properly relieved. I will attempt another blood gas when patient isn't flailing as much and they get the sedation controlled.

## 2017-06-06 NOTE — Discharge Summary (Signed)
[facility]  DISCHARGE SUMMARY    Name:  KEKOA, FYOCK  MR#:   [id number]  DOB:  03-Oct-1980  ACCOUNT #:  [id number]  ADMIT DATE:  06/06/2017  DISCHARGE DATE:  06/09/2017    PRIMARY CARE PHYSICIAN:  He follows up with the neurologist, Dr. Legrand Como.    DISPOSITION:  Discharged home.    DISCHARGE CONDITION:  Stable.    DISCHARGE DIAGNOSES:  1.  Status epilepticus.  2.  Acute respiratory failure due to status epilepticus, status post intubation and extubated on 06/07/2017.  3.  Acute metabolic encephalopathy due to status epilepticus, resolved now.  4.  Noncompliance due to medication.  5.  Mild elevated CK/mild rhabdomyolysis.  6.  Hyperkalemia, repleted in the hospital.  7.  Mild prerenal azotemia, resolved now.    DISCHARGE MEDICATIONS:  1.  Vimpat 100 mg b.i.d.  2.  Trileptal 450 mg twice daily.    MEDICATION STOPPED IN THE HOSPITAL:  Keppra has been discontinued.    CONSULTATIONS IN THE HOSPITAL:  Consultations with Dr. Jule Ser, Neurology; consultation with Dr. Mcarthur Rossetti, ICU Critical Care.    PROCEDURES:  None.    INVESTIGATIONS IN THE HOSPITAL STAY:  The patient had a CTA done on admission, which showed no acute findings.    HOSPITAL COURSE:  This is a 36 year old African-American male with known history of seizure disorder with a history of multiple hospitalizations in the past requiring craniotomy and meningitis who developed seizure disorder.  He presented to the ED with status epilepticus, was noted to have acute metabolic encephalopathy due to status epilepticus.  He was intubated for airway protection.  He was admitted to the ICU, started on IV Keppra and Trileptal which he was taking.  He was admitted to ICU, and neurology was consulted.  Neurology saw the patient and recommended to continue medications, Keppra and Trileptal, and also aggressive IV hydration and monitored closely.  The patient responded well.  He became seizure-free and eventually, the patient was extubated in the ICU,  which he tolerated very well.  The patient was monitored overnight in the ICU post extubation.  He did not have any problems.  He was totally weaned off oxygen and was transferred to the floor.  The patient tolerated extubation without any problem.  He is currently being off oxygen and ambulating without any problem.  The patient admitted that he stopped using Keppra because it was giving him dizziness, so neurology decided to change his Keppra to Vimpat as he could not tolerate Keppra.  The patient is currently on Vimpat and Trileptal.  Neurology followed up outpatient today, recommended okay for discharge.  His CK was done repeated today, which is slightly around 1300, but the patient's renal failure improved and he is tolerating feeds p.o. very well.  Discussed with the Neurology team.  Recommends okay for discharge with increased p.o. fluids.  The patient is currently tolerating p.o. without any problem.  Discussed with the patient about change in the medications and followups.  He says he will follow up back with his neurologist, Dr. Mare Ferrari at [facility], and he has a PCP, he cannot recall the name, and he will follow up in a week.  Discussed with the patient about increase fluid intake and recommended about medication compliance and diet compliance.  He completely understood and agreed with the plan.  Answered all his questions and concerns appropriately.    DISCHARGE INSTRUCTIONS, FOLLOWUP APPOINTMENTS, AND PHYSICAL EXAM:  Please refer to the previous discharge summary.  Loleta Books, MD      [id number]  D:  06/09/2017 12:33  T:  06/09/2017 13:42  JOB #:  [id number]  CC:  Gerald Stabs, MD

## 2017-06-07 ENCOUNTER — Inpatient Hospital Stay: Admit: 2017-06-07 | Payer: Medicare (Managed Care)

## 2017-06-07 LAB — CBC WITH AUTOMATED DIFF
ABS. BASOPHILS: 0 10*3/uL (ref 0.0–0.1)
ABS. EOSINOPHILS: 0.1 10*3/uL (ref 0.0–0.4)
ABS. LYMPHOCYTES: 1.6 10*3/uL (ref 0.9–3.6)
ABS. MONOCYTES: 0.9 10*3/uL (ref 0.05–1.2)
ABS. NEUTROPHILS: 7.3 10*3/uL (ref 1.8–8.0)
BASOPHILS: 0 % (ref 0–2)
EOSINOPHILS: 1 % (ref 0–5)
HCT: 35.2 % — ABNORMAL LOW (ref 36.0–48.0)
HGB: 11.9 g/dL — ABNORMAL LOW (ref 13.0–16.0)
LYMPHOCYTES: 17 % — ABNORMAL LOW (ref 21–52)
MCH: 28.9 PG (ref 24.0–34.0)
MCHC: 33.8 g/dL (ref 31.0–37.0)
MCV: 85.4 FL (ref 74.0–97.0)
MONOCYTES: 9 % (ref 3–10)
MPV: 10.1 FL (ref 9.2–11.8)
NEUTROPHILS: 73 % (ref 40–73)
PLATELET: 238 10*3/uL (ref 135–420)
RBC: 4.12 M/uL — ABNORMAL LOW (ref 4.70–5.50)
RDW: 14.1 % (ref 11.6–14.5)
WBC: 10 10*3/uL (ref 4.6–13.2)

## 2017-06-07 LAB — METABOLIC PANEL, COMPREHENSIVE
A-G Ratio: 0.9 (ref 0.8–1.7)
ALT (SGPT): 20 U/L (ref 16–61)
AST (SGOT): 36 U/L (ref 15–37)
Albumin: 3.2 g/dL — ABNORMAL LOW (ref 3.4–5.0)
Alk. phosphatase: 107 U/L (ref 45–117)
Anion gap: 4 mmol/L (ref 3.0–18)
BUN/Creatinine ratio: 9 — ABNORMAL LOW (ref 12–20)
BUN: 11 MG/DL (ref 7.0–18)
Bilirubin, total: 0.3 MG/DL (ref 0.2–1.0)
CO2: 23 mmol/L (ref 21–32)
Calcium: 8.4 MG/DL — ABNORMAL LOW (ref 8.5–10.1)
Chloride: 115 mmol/L — ABNORMAL HIGH (ref 100–108)
Creatinine: 1.23 MG/DL (ref 0.6–1.3)
GFR est AA: >60 mL/min/{1.73_m2} (ref >60)
GFR est non-AA: >60 mL/min/{1.73_m2} (ref >60)
Globulin: 3.4 g/dL (ref 2.0–4.0)
Glucose: 94 mg/dL (ref 74–99)
Potassium: 3.6 mmol/L (ref 3.5–5.5)
Protein, total: 6.6 g/dL (ref 6.4–8.2)
Sodium: 142 mmol/L (ref 136–145)

## 2017-06-07 LAB — BLOOD GAS, ARTERIAL POC
Base deficit (POC): 3 mmol/L
FIO2 (POC): 45 %
HCO3 (POC): 22.8 MMOL/L (ref 22–26)
Inspiratory Time: 0.9 s
PEEP/CPAP (POC): 5 cmH2O
Patient temp.: 98.6
Set Rate: 14 {beats}/min
Tidal volume: 500 ml
Total resp. rate: 14
pCO2 (POC): 44.6 MMHG (ref 35.0–45.0)
pH (POC): 7.316 — ABNORMAL LOW (ref 7.35–7.45)
pO2 (POC): 253 MMHG — ABNORMAL HIGH (ref 80–100)
sO2 (POC): 100 % — ABNORMAL HIGH (ref 92–97)

## 2017-06-07 LAB — DRUG SCREEN, URINE
AMPHETAMINES: NEGATIVE
BARBITURATES: NEGATIVE
BENZODIAZEPINES: POSITIVE — AB
COCAINE: NEGATIVE
METHADONE: NEGATIVE
OPIATES: NEGATIVE
PCP(PHENCYCLIDINE): NEGATIVE
THC (TH-CANNABINOL): POSITIVE — AB

## 2017-06-07 LAB — TSH 3RD GENERATION: TSH: 0.35 u[IU]/mL — ABNORMAL LOW (ref 0.36–3.74)

## 2017-06-07 LAB — T4, FREE: T4, Free: 0.9 NG/DL (ref 0.7–1.5)

## 2017-06-07 LAB — MAGNESIUM: Magnesium: 2.7 mg/dL — ABNORMAL HIGH (ref 1.6–2.6)

## 2017-06-07 LAB — CALCIUM, IONIZED: Ionized Calcium: 1.21 MMOL/L (ref 1.12–1.32)

## 2017-06-07 LAB — GLUCOSE, POC
Glucose (POC): 81 mg/dL (ref 70–110)
Glucose (POC): 85 mg/dL (ref 70–110)

## 2017-06-07 LAB — CK: CK: 1093 U/L — ABNORMAL HIGH (ref 39–308)

## 2017-06-07 LAB — PHOSPHORUS: Phosphorus: 2.8 MG/DL (ref 2.5–4.9)

## 2017-06-07 MED ORDER — GLUCAGON 1 MG INJECTION
1 mg | INTRAMUSCULAR | Status: DC | PRN
Start: 2017-06-07 — End: 2017-06-09

## 2017-06-07 MED ORDER — SODIUM CHLORIDE 0.9 % IJ SYRG
Freq: Three times a day (TID) | INTRAMUSCULAR | Status: DC
Start: 2017-06-07 — End: 2017-06-09
  Administered 2017-06-07 – 2017-06-09 (×9): via INTRAVENOUS

## 2017-06-07 MED ORDER — MIDAZOLAM 2 MG/ML IN 0.9 % SODIUM CHLORIDE INTRAVENOUS
2 mg/mL | INTRAVENOUS | Status: DC
Start: 2017-06-07 — End: 2017-06-07

## 2017-06-07 MED ORDER — ELECTROLYTE-R IV
INTRAVENOUS | Status: AC
Start: 2017-06-07 — End: 2017-06-09
  Administered 2017-06-07 – 2017-06-09 (×6): via INTRAVENOUS

## 2017-06-07 MED ORDER — ELECTROLYTE-R IV
INTRAVENOUS | Status: DC
Start: 2017-06-07 — End: 2017-06-07
  Administered 2017-06-07 (×2): via INTRAVENOUS

## 2017-06-07 MED ORDER — LORAZEPAM 2 MG/ML IJ SOLN
2 mg/mL | INTRAMUSCULAR | Status: AC
Start: 2017-06-07 — End: 2017-06-07

## 2017-06-07 MED ORDER — PROPOFOL 10 MG/ML IV EMUL
10 mg/mL | INTRAVENOUS | Status: AC
Start: 2017-06-07 — End: 2017-06-07

## 2017-06-07 MED ORDER — CHLORHEXIDINE GLUCONATE 0.12 % MOUTHWASH
0.12 % | Freq: Once | Status: AC
Start: 2017-06-07 — End: 2017-06-07
  Administered 2017-06-07: 06:00:00 via ORAL

## 2017-06-07 MED ORDER — SODIUM CHLORIDE 0.9 % IJ SYRG
INTRAMUSCULAR | Status: DC | PRN
Start: 2017-06-07 — End: 2017-06-09

## 2017-06-07 MED ORDER — DEXTROSE 50% IN WATER (D50W) IV SYRG
INTRAVENOUS | Status: DC | PRN
Start: 2017-06-07 — End: 2017-06-09

## 2017-06-07 MED ORDER — ELECTROLYTE-R IV
INTRAVENOUS | Status: AC
Start: 2017-06-07 — End: 2017-06-07

## 2017-06-07 MED ORDER — PHARMACY INFORMATION NOTE
Freq: Once | Status: AC
Start: 2017-06-07 — End: 2017-06-07
  Administered 2017-06-07: 13:00:00

## 2017-06-07 MED ORDER — GLUCOSE 4 GRAM CHEWABLE TAB
4 gram | ORAL | Status: DC | PRN
Start: 2017-06-07 — End: 2017-06-09

## 2017-06-07 MED ORDER — CHLORHEXIDINE GLUCONATE 0.12 % MOUTHWASH
0.12 % | Freq: Two times a day (BID) | Status: DC
Start: 2017-06-07 — End: 2017-06-07
  Administered 2017-06-07 (×2): via ORAL

## 2017-06-07 MED FILL — LEVETIRACETAM 500 MG/5 ML IV SOLN: 500 mg/5 mL | INTRAVENOUS | Qty: 15

## 2017-06-07 MED FILL — BD POSIFLUSH NORMAL SALINE 0.9 % INJECTION SYRINGE: INTRAMUSCULAR | Qty: 40

## 2017-06-07 MED FILL — CHLORHEXIDINE GLUCONATE 0.12 % MOUTHWASH: 0.12 % | Qty: 15

## 2017-06-07 MED FILL — PROPOFOL 10 MG/ML IV EMUL: 10 mg/mL | INTRAVENOUS | Qty: 100

## 2017-06-07 MED FILL — NORMOSOL-R INTRAVENOUS SOLUTION: INTRAVENOUS | Qty: 1000

## 2017-06-07 MED FILL — PHARMACY INFORMATION NOTE: Qty: 1

## 2017-06-07 MED FILL — MIDAZOLAM 2 MG/ML IN 0.9 % SODIUM CHLORIDE INTRAVENOUS: 2 mg/mL | INTRAVENOUS | Qty: 30

## 2017-06-07 MED FILL — OXCARBAZEPINE 300 MG/5 ML ORAL SUSP: 300 mg/5 mL (60 mg/mL) | ORAL | Qty: 10

## 2017-06-07 MED FILL — LORAZEPAM 2 MG/ML IJ SOLN: 2 mg/mL | INTRAMUSCULAR | Qty: 3

## 2017-06-07 NOTE — Other (Signed)
Bedside and Verbal shift change report given to Melissa RN (oncoming nurse) by Joletta M Taguba, RN   (offgoing nurse). Report included the following information Kardex, ED Summary, Procedure Summary, Intake/Output, MAR, Recent Results, Med Rec Status, Cardiac Rhythm NSR and Alarm Parameters .

## 2017-06-07 NOTE — Other (Signed)
Mr. Sabedra was transported from Main Emergency Room to MICU via departmental transport ventilator. Mr. Scalzo was interfaced  on the PB840 upon arrival to MICU. Mr. Zeitler remained on the same settings, all alarms were checked.

## 2017-06-07 NOTE — Progress Notes (Signed)
Doing well on SBT seen by Dr Wynelle Link who stated to extubate pt, [name] from RT notified

## 2017-06-07 NOTE — Consults (Signed)
NEUROLOGY CONSULT NOTE    Patient ID:  Nicholas Cummings  [id number]  36 y.o.  08/19/1980    Date of Consultation:  June 07, 2017    Referring Physician: Marilynn Latino MD    Reason for Consultation:  Status Epilepticus      Subjective:       History of Present Illness:     This is a 37 yo man with hx of epilepsy admitted on 27 April with status epilepticus, about 5 GTC. He was loaded with keppra, continued Trileptal via NG tube and intubated and with no further evidence of clinical seizure.     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 Since then pt has been having multiple hospitalizations for recurrent epilepsy. Patient moved from [facility] to [place] last year and was admitted to [facility] for unintentional dilantin toxicity in Oct 2018 attributed to taking dilantin for seizure control. As a result, dilantin was discontinued; keppra dose was increased. Patient continued to have ED visits monthly for seizure d/o. In Dec 2018, pt experienced a fall complicated by a small SAH following a seizure activity. Pt was intubated for this admission and subsequently extubated; trileptal added to AED regimen and recommended to see Neurology as outpatient. Patient was recently seen by Dr. Dorthula Nettles of [facility] last Jan 2019 during which his trileptal dose was increased to 450 BID and recommended to continue keppra.     This am pt is still intubated and sedated, no reported seizures like activity.        Patient Active Problem List    Diagnosis Date Noted   ??? Acute respiratory failure ([place]) 06/07/2017   ??? Status epilepticus ([place]) 06/06/2017     Past Medical History:   Diagnosis Date   ??? Seizures ([place])       History reviewed. No pertinent surgical history.   Prior to Admission medications    Medication Sig Start Date End Date Taking? Authorizing Provider    OXcarbazepine (TRILEPTAL) 300 mg tablet Take 300 mg by mouth two (2) times a day.   Yes Provider, Historical   levETIRAcetam (KEPPRA) 750 mg tablet Take 1,500 mg by mouth two (2) times a day.   Yes Provider, Historical     No Known Allergies   Social History     Tobacco Use   ??? Smoking status: Unknown If Ever Smoked   Substance Use Topics   ??? Alcohol use: Not on file      History reviewed. No pertinent family history.       Review of Systems    Review of systems not obtained due to patient factors.    Objective:     Patient Vitals for the past 8 hrs:   BP Temp Pulse Resp SpO2 Height Weight   06/07/17 0845 ??? ??? 63 14 100 % ??? ???   06/07/17 0838 ??? ??? ??? ??? ??? 6' (1.829 m) ???   06/07/17 0800 ??? (!) 94.4 ??F (34.7 ??C) ??? ??? ??? ??? ???   06/07/17 0600 118/74 ??? 61 14 100 % ??? ???   06/07/17 0500 121/75 ??? (!) 58 14 100 % ??? ???   06/07/17 0400 117/79 98.4 ??F (36.9 ??C) 62 14 100 % ??? 66.8 kg (147 lb 4.3 oz)   06/07/17 0300 105/69 ??? 66 14 100 % ??? ???   06/07/17 0200 109/62 ??? 67 ??? 100 % ??? ???  General Exam  No acute distress, normal body habitus    HEENT: Normocephalic, atraumatic, Sclera anicteric, normal conjunctiva  Mucous membranes: normal color and hydration     CV: No carotid bruits,   Heart: regular to rate and rhythm. No murmurs     Neurologic Exam:    Mental status:  Intubated, sedated .    Cranial nerves: pupils midline , sluggish responsive to light, no gaze deviation. VOR present.      Motor: no involuntarily movements     Data Review:    Recent Results (from the past 24 hour(s))   CBC WITH AUTOMATED DIFF    Collection Time: 06/06/17  5:00 PM   Result Value Ref Range    WBC 11.9 4.6 - 13.2 K/uL    RBC 4.65 (L) 4.70 - 5.50 M/uL    HGB 13.5 13.0 - 16.0 g/dL    HCT 41.1 36.0 - 48.0 %    MCV 88.4 74.0 - 97.0 FL    MCH 29.0 24.0 - 34.0 PG    MCHC 32.8 31.0 - 37.0 g/dL    RDW 14.2 11.6 - 14.5 %    PLATELET 273 135 - 420 K/uL    MPV 10.7 9.2 - 11.8 FL    NEUTROPHILS 51 40 - 73 %    LYMPHOCYTES 41 21 - 52 %    MONOCYTES 5 3 - 10 %     EOSINOPHILS 3 0 - 5 %    BASOPHILS 0 0 - 2 %    ABS. NEUTROPHILS 6.0 1.8 - 8.0 K/UL    ABS. LYMPHOCYTES 4.8 (H) 0.9 - 3.6 K/UL    ABS. MONOCYTES 0.6 0.05 - 1.2 K/UL    ABS. EOSINOPHILS 0.4 0.0 - 0.4 K/UL    ABS. BASOPHILS 0.1 0.0 - 0.1 K/UL    DF AUTOMATED     METABOLIC PANEL, COMPREHENSIVE    Collection Time: 06/06/17  5:00 PM   Result Value Ref Range    Sodium 142 136 - 145 mmol/L    Potassium 3.8 3.5 - 5.5 mmol/L    Chloride 111 (H) 100 - 108 mmol/L    CO2 11 (L) 21 - 32 mmol/L    Anion gap 20 (H) 3.0 - 18 mmol/L    Glucose 90 74 - 99 mg/dL    BUN 9 7.0 - 18 MG/DL    Creatinine 1.32 (H) 0.6 - 1.3 MG/DL    BUN/Creatinine ratio 7 (L) 12 - 20      GFR est AA >60 >60 ml/min/1.31m    GFR est non-AA >60 >60 ml/min/1.720m   Calcium 9.2 8.5 - 10.1 MG/DL    Bilirubin, total 0.2 0.2 - 1.0 MG/DL    ALT (SGPT) 26 16 - 61 U/L    AST (SGOT) 33 15 - 37 U/L    Alk. phosphatase 118 (H) 45 - 117 U/L    Protein, total 8.4 (H) 6.4 - 8.2 g/dL    Albumin 3.9 3.4 - 5.0 g/dL    Globulin 4.5 (H) 2.0 - 4.0 g/dL    A-G Ratio 0.9 0.8 - 1.7     POC G3    Collection Time: 06/06/17  7:31 PM   Result Value Ref Range    Device: VENT      FIO2 (POC) 45 %    pH (POC) 7.229 (LL) 7.35 - 7.45      pCO2 (POC) 47.3 (H) 35.0 - 45.0 MMHG    pO2 (POC) 41 (LL) 80 -  100 MMHG    HCO3 (POC) 19.7 (L) 22 - 26 MMOL/L    sO2 (POC) 67 (L) 92 - 97 %    Base deficit (POC) 8 mmol/L    Mode ASSIST CONTROL      Tidal volume 500 ml    Set Rate 14 bpm    PEEP/CPAP (POC) 5.0 cmH2O    PIP (POC) 27      Allens test (POC) N/A      Inspiratory Time 0.9 sec    Nitric-ppm (POC) 0 ppm    Site RIGHT RADIAL      Patient temp. 37.0      Specimen type (POC) ARTERIAL      Performed by Zoe Lan     Volume control plus YES     POC G3    Collection Time: 06/06/17  7:41 PM   Result Value Ref Range    Device: VENT      FIO2 (POC) 45 %    pH (POC) 7.247 (LL) 7.35 - 7.45      pCO2 (POC) 43.9 35.0 - 45.0 MMHG    pO2 (POC) 43 (LL) 80 - 100 MMHG    HCO3 (POC) 19.1 (L) 22 - 26 MMOL/L     sO2 (POC) 71 (L) 92 - 97 %    Base deficit (POC) 8 mmol/L    Mode ASSIST CONTROL      Tidal volume 50 ml    Set Rate 14 bpm    PEEP/CPAP (POC) 5.0 cmH2O    PIP (POC) 14      Allens test (POC) N/A      Inspiratory Time 0.9 sec    Nitric-ppm (POC) 0 ppm    Total resp. rate 25      Site RIGHT RADIAL      Patient temp. 37.0      Specimen type (POC) ARTERIAL      Performed by Zoe Lan     Volume control plus YES     POC G3    Collection Time: 06/07/17  5:19 AM   Result Value Ref Range    Device: VENT      FIO2 (POC) 45 %    pH (POC) 7.316 (L) 7.35 - 7.45      pCO2 (POC) 44.6 35.0 - 45.0 MMHG    pO2 (POC) 253 (H) 80 - 100 MMHG    HCO3 (POC) 22.8 22 - 26 MMOL/L    sO2 (POC) 100 (H) 92 - 97 %    Base deficit (POC) 3 mmol/L    Mode ASSIST CONTROL      Tidal volume 500 ml    Set Rate 14 bpm    PEEP/CPAP (POC) 5 cmH2O    Allens test (POC) YES      Inspiratory Time 0.9 sec    Total resp. rate 14      Site LEFT RADIAL      Patient temp. 98.6      Specimen type (POC) ARTERIAL      Performed by Pati Gallo     Volume control plus YES     T4, FREE    Collection Time: 06/07/17  5:42 AM   Result Value Ref Range    T4, Free 0.9 0.7 - 1.5 NG/DL   TSH 3RD GENERATION    Collection Time: 06/07/17  5:42 AM   Result Value Ref Range    TSH 0.35 (L) 0.36 - 3.74 uIU/mL   CBC WITH AUTOMATED DIFF  Collection Time: 06/07/17  5:42 AM   Result Value Ref Range    WBC 10.0 4.6 - 13.2 K/uL    RBC 4.12 (L) 4.70 - 5.50 M/uL    HGB 11.9 (L) 13.0 - 16.0 g/dL    HCT 35.2 (L) 36.0 - 48.0 %    MCV 85.4 74.0 - 97.0 FL    MCH 28.9 24.0 - 34.0 PG    MCHC 33.8 31.0 - 37.0 g/dL    RDW 14.1 11.6 - 14.5 %    PLATELET 238 135 - 420 K/uL    MPV 10.1 9.2 - 11.8 FL    NEUTROPHILS 73 40 - 73 %    LYMPHOCYTES 17 (L) 21 - 52 %    MONOCYTES 9 3 - 10 %    EOSINOPHILS 1 0 - 5 %    BASOPHILS 0 0 - 2 %    ABS. NEUTROPHILS 7.3 1.8 - 8.0 K/UL    ABS. LYMPHOCYTES 1.6 0.9 - 3.6 K/UL    ABS. MONOCYTES 0.9 0.05 - 1.2 K/UL    ABS. EOSINOPHILS 0.1 0.0 - 0.4 K/UL     ABS. BASOPHILS 0.0 0.0 - 0.1 K/UL    DF AUTOMATED     METABOLIC PANEL, COMPREHENSIVE    Collection Time: 06/07/17  5:42 AM   Result Value Ref Range    Sodium 142 136 - 145 mmol/L    Potassium 3.6 3.5 - 5.5 mmol/L    Chloride 115 (H) 100 - 108 mmol/L    CO2 23 21 - 32 mmol/L    Anion gap 4 3.0 - 18 mmol/L    Glucose 94 74 - 99 mg/dL    BUN 11 7.0 - 18 MG/DL    Creatinine 1.23 0.6 - 1.3 MG/DL    BUN/Creatinine ratio 9 (L) 12 - 20      GFR est AA >60 >60 ml/min/1.5m    GFR est non-AA >60 >60 ml/min/1.763m   Calcium 8.4 (L) 8.5 - 10.1 MG/DL    Bilirubin, total 0.3 0.2 - 1.0 MG/DL    ALT (SGPT) 20 16 - 61 U/L    AST (SGOT) 36 15 - 37 U/L    Alk. phosphatase 107 45 - 117 U/L    Protein, total 6.6 6.4 - 8.2 g/dL    Albumin 3.2 (L) 3.4 - 5.0 g/dL    Globulin 3.4 2.0 - 4.0 g/dL    A-G Ratio 0.9 0.8 - 1.7     MAGNESIUM    Collection Time: 06/07/17  5:42 AM   Result Value Ref Range    Magnesium 2.7 (H) 1.6 - 2.6 mg/dL   PHOSPHORUS    Collection Time: 06/07/17  5:42 AM   Result Value Ref Range    Phosphorus 2.8 2.5 - 4.9 MG/DL   CK    Collection Time: 06/07/17  5:42 AM   Result Value Ref Range    CK 1,093 (H) 39 - 308 U/L   CALCIUM, IONIZED    Collection Time: 06/07/17  5:42 AM   Result Value Ref Range    Ionized Calcium 1.21 1.12 - 1.32 MMOL/L         Radiology studies:   Head CT:   IMPRESSION:  No acute finding. Bifrontal stable encephalomalacia and chronic white matter changes.    Prior frontal craniotomy. There is no hemorrhage, mass, nor acute infarct.  ??    Assessment:     Mr RoLorren Splawns a [age]o man with hx of depression,  anxiety, head trauma/SAH, epilepsy admitted on 27 April with status epilepticus requiring intubation for airway protection.   This am pt heavily sedated. The head CT neg for acute pathology. Labs significant for elevated CPK most likely secondary to seizures known to cause rhabdo.       Principal Problem:    Acute respiratory failure ([place]) (06/07/2017)    Active Problems:     Status epilepticus ([place]) (06/06/2017)        Plan:     - Continue Keppra 1500 mg BID  - Continue trileptal 450 mg BID   - Rigorous hydration due to concern for developing rhabo which in term can cause renal failure   -  I recommend winning off mechanical ventilation, if any concerns for seizures please let me know, next step will be to load Vimpat 300 mg and continue 100 mg BID    Gasper Sells, MD  Adult Neurologist  06/07/2017

## 2017-06-07 NOTE — Progress Notes (Addendum)
Waking up opening eyes to name pulling against restraints given reassurance, pt with strong cough and suctioned for tan mucus

## 2017-06-07 NOTE — Progress Notes (Signed)
Diprivan titrated off , no seizures seen pt is till not waking up

## 2017-06-07 NOTE — Other (Signed)
TRANSFER - IN REPORT:    Verbal report received from [facility] RN(name) on Nicholas Cummings  being received from ED(unit) for routine progression of care      Report consisted of patient???s Situation, Background, Assessment and   Recommendations(SBAR).     Information from the following report(s) Kardex, ED Summary, Procedure Summary, Intake/Output, MAR, Recent Results, Med Rec Status and Cardiac Rhythm NSR was reviewed with the receiving nurse.    Opportunity for questions and clarification was provided.      Assessment completed upon patient???s arrival to unit and care assumed.

## 2017-06-07 NOTE — Progress Notes (Signed)
Pt extubated placed on 3 LPM NC HR 91 BP 156/79 RR 15 O2 sat at 100%. Tolerated well.

## 2017-06-07 NOTE — Progress Notes (Signed)
Bedside and Verbal shift change report given to JoulettaRN (oncoming nurse) by MKangasRN (offgoing nurse). Report included the following information SBAR, Kardex and MAR.

## 2017-06-07 NOTE — H&P (Signed)
[facility]  Pulmonary, Critical Care, and Sleep Medicine  Name: Nicholas Cummings MRN: [id number]   DOB: May 16, 1980 Hospital: [facility]   Date: 06/07/2017        Critical Care History & Physical      IMPRESSION:   ?? Acute encephalopathy secondary to status epilepticus in setting of underlying epilepsy  ?? Acute respiratory failure, intubated for airway protection - difficult airway (reported edema)  ?? 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 Prior hx of head trauma, SAH  ?? Concern for medical non-compliance to AEDs     Patient Active Problem List   Diagnosis Code   ??? Status epilepticus ([place]) G40.901   ??? Acute respiratory failure (HCC) J96.00        RECOMMENDATIONS:   ?? Neuro: continue keppra and trileptal per Neuro recommendations. Propofol gtt to control seizure activity; may add versed. Pt will need EEG. Neurology consulted by ED  ?? Resp: VAP bundle. Repeat ABG -prior ABGs appear mixed venous gas although O2 sat on monitor 98-100%. Titrate FiO2 to keep SpO2 > 90%. SBT once status epilepticus has resolved.    ?? I/D: no suspicion for infectious process. Trend WBC, temp curve and if febrile +/- leukocytosis or leukopenia, consider panculture  ?? Hem/Onc: stable H/H, platelets. Obtain coags in am  ?? CVS: hemodynamically stable. Latest EF 60% (Echo from 01/2017).  ?? Metabolic: trend electrolytes and replace cautiously as needed given mild elevation in creatinine  ?? Renal: foley cath, strict I/O. Trend renal indices  ?? Endocrine: frequent glucose checks while NPO. Avoid hypoglycemia  ?? GI: NPO for now. Consider enteral nutrition in am if patient remains intubated  ?? Musc/Skin: stable, no issues  ?? Code Status: Full code. Mother Sriram Febles: [phone]; RN from [facility]) expressed concerns on the following issues: (1) requests the help of case  manager in finding a psychiatrist for pt to see as an outpatient; (2) requests for pt to [facility] as pt's neurologist (Dr. Dorthula Nettles of [facility]) belongs there. Explained that transfer will need to be initiated by them unless there is necessary care that [facility] facility is unable to provide to which mother expressed understanding of this; (3) concern regarding weaning propofol as she reports "addictive" nature of propofol -explained to her that our ICU follows a protocol for proper titration of continuous sedative infusions; (4) any changes will need to be updated to her or her daughter Dorna Leitz.      Best practice:  ?? Glycemic control  ?? IHI ICU Bundles:  ??  Vent Bundle Followed, Vent Day 1    ?? Mech Vent patients/ Pulmonary pts:   ?? VAP bundle, Aim to keep peak plateau pressure 25-30cm H2O  ?? Daily sedation holiday as indicated  ?? SBT as tolerated/appropriate  ?? DVT prophylaxis. SCDs.  ?? Need for Lines, foley assessed.  ?? Restraints need.      Subjective/History:    This patient has been seen and evaluated at the request of Dr. Edison Simon for status epilepticus.      06/07/17    Patient is a 37 y.o. male with a medical hx significant for epilepsy on AEDs, anxiety, depression, memory d/o, prior head trauma/ SAH, presented to the ED for generalized tonic-clonic seizures witnessed by family. In ED, pt continued to have seizures requiring intubation for airway protection. Per chart review, intubation was difficult airway due to  edema. Neurology was consulted (Dr. Loleta Books) and recommended to increase loading dose of keppra and start trileptal. Patient was started on propofol infusion to control generalized seizure activity. ICU was then consulted for admission.    Pt initially evaluated in ED. Only had intermittent myoclonic jerking of extremities on stimulation. ABGs post-intubation noted and appeared to be mixed venous gas (although SpO2 on monitor 98-100%).     EHR from care everywhere reviewed. 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 Since then pt has been having multiple hospitalizations for recurrent epilepsy. Patient moved from [facility] to [place] last year and was [facility] to [facility] for unintentional dilantin toxicity in Oct 2018 attributed to taking dilantin for seizure control. As a result, dilantin was discontinued; keppra dose was increased. Patient continued to have ED visits monthly for seizure d/o. In Dec 2018, pt experienced a fall complicated by a small SAH following a seizure activity. Pt was intubated for this admission and subsequently extubated; trileptal added to AED regimen and recommended to see Neurology as outpatient. Patient was recently seen by Dr. Dorthula Nettles of [facility] last Jan 2019 during which his trileptal dose was increased to 450 BID and recommended to continue keppra.     The patient is critically ill and can not provide additional history due to Ventilated and sedated.       Past Medical History:   Diagnosis Date   ??? Seizures ([place])         History reviewed. No pertinent surgical history.     Prior to Admission medications    Not on File       [facility]-Administered Medications   Medication Dose Route Frequency   ??? electrolyte-r (NORMOSOL R) infusion   IntraVENous CONTINUOUS   ??? chlorhexidine (PERIDEX) 0.12 % mouthwash 10 mL  10 mL Oral ONCE   ??? chlorhexidine (PERIDEX) 0.12 % mouthwash 10 mL  10 mL Oral Q12H   ??? midazolam (VERSED) 1 mg/mL injection       ??? OXcarbazepine (TRILEPTAL) 300 mg/5 mL (60 mg/mL) oral suspension 450 mg  450 mg Per G Tube Q12H   ??? levETIRAcetam (KEPPRA) 1,500 mg in 0.9% sodium chloride 100 mL IVPB  1,500 mg IntraVENous Q12H   ??? propofol (DIPRIVAN) infusion  0-50 mcg/kg/min IntraVENous TITRATE   ??? LORazepam (ATIVAN) 2 mg/mL injection        ??? midazolam in normal saline (VERSED) 2 mg/mL infusion  1-3 mg/hr IntraVENous TITRATE   ??? sodium chloride (NS) flush 5-40 mL  5-40 mL IntraVENous Q8H       Not on File     Social History     Tobacco Use   ??? Smoking status: Unknown If Ever Smoked   Substance Use Topics   ??? Alcohol use: Not on file        History reviewed. No pertinent family history.       Review of Systems:  Review of systems not obtained due to patient factors.    Objective:  Vital Signs:    Visit Vitals  BP 127/82   Pulse 68   Temp 98.9 ??F (37.2 ??C)   Resp 17   Wt 70 kg (154 lb 5.2 oz)   SpO2 100%               Temp (24hrs), Avg:98.9 ??F (37.2 ??C), Min:98.9 ??F (37.2 ??C), Max:98.9 ??F (37.2 ??C)  Intake/Output:   Last shift:      No intake/output data recorded.  Last 3 shifts: No intake/output data recorded.  No intake or output data in the 24 hours ending 06/07/17 0053    Ventilator Settings:  Mode Rate Tidal Volume Pressure FiO2 PEEP   Assist control, VC+   500 ml    45 % 5 cm H20     Peak airway pressure: 14 cm H2O    Minute ventilation: 16.6 l/min      ARDS network Guidelines:   Lung protective strategy and Plateau  Pressure goal < 30 cm H2O goals  Oxygenation Goals PaO2 55-80 mm Hg or SaO2 88-95%  PH goal 7.30-7.45    VAP bundle:  Reviewed.  Cass tube to suction at 20-30 cm Hg.  Maintain Cass tube with 5-29m air every 4 hours  Routine oral care every 4 hours  Elevation of head > 45 degree  Daily sedation holiday and SBT evaluation starting at 6.00am.    Physical Exam:     General:  Intubated/Sedated    Head:  + frontal plate misaligned from rest of skull   Eyes:  Pink palpebral conjunctivae, anicteric sclerae. Pupils 3 mm b/l brisk and reactive to light   Nose: Nares normal. No drainage    Throat: Intubated    Neck: Supple, symmetrical, trachea midline, no adenopathy, no carotid bruit and no JVD.   Lungs:   Symmetrical chest rise; good AE bilat; CTAB; no wheezes/rhonchi/rales noted.    Heart:  RRR, S1, S2 normal, no m/r/g    Abdomen:   Soft, Bowel sounds normal.    Extremities: Extremities normal, atraumatic, no cyanosis or edema.   Pulses: 2+ and symmetric all extremities.   Skin: Skin color, texture, turgor normal. No rashes or lesions   Neurologic: Sedated however has brief intermittent jerking of random extremities when stimulated   Devices:  ?? ETT: 4/27  ?? OGT: 4/27  ?? Lines: none   ?? Drains: none  ?? Foley: 4/27    Data:     Recent Results (from the past 24 hour(s))   CBC WITH AUTOMATED DIFF    Collection Time: 06/06/17  5:00 PM   Result Value Ref Range    WBC 11.9 4.6 - 13.2 K/uL    RBC 4.65 (L) 4.70 - 5.50 M/uL    HGB 13.5 13.0 - 16.0 g/dL    HCT 41.1 36.0 - 48.0 %    MCV 88.4 74.0 - 97.0 FL    MCH 29.0 24.0 - 34.0 PG    MCHC 32.8 31.0 - 37.0 g/dL    RDW 14.2 11.6 - 14.5 %    PLATELET 273 135 - 420 K/uL    MPV 10.7 9.2 - 11.8 FL    NEUTROPHILS 51 40 - 73 %    LYMPHOCYTES 41 21 - 52 %    MONOCYTES 5 3 - 10 %    EOSINOPHILS 3 0 - 5 %    BASOPHILS 0 0 - 2 %    ABS. NEUTROPHILS 6.0 1.8 - 8.0 K/UL    ABS. LYMPHOCYTES 4.8 (H) 0.9 - 3.6 K/UL    ABS. MONOCYTES 0.6 0.05 - 1.2 K/UL    ABS. EOSINOPHILS 0.4 0.0 - 0.4 K/UL    ABS. BASOPHILS 0.1 0.0 - 0.1 K/UL    DF AUTOMATED     METABOLIC PANEL, COMPREHENSIVE    Collection Time: 06/06/17  5:00 PM   Result Value Ref Range    Sodium 142  136 - 145 mmol/L    Potassium 3.8 3.5 - 5.5 mmol/L    Chloride 111 (H) 100 - 108 mmol/L    CO2 11 (L) 21 - 32 mmol/L    Anion gap 20 (H) 3.0 - 18 mmol/L    Glucose 90 74 - 99 mg/dL    BUN 9 7.0 - 18 MG/DL    Creatinine 1.32 (H) 0.6 - 1.3 MG/DL    BUN/Creatinine ratio 7 (L) 12 - 20      GFR est AA >60 >60 ml/min/1.31m    GFR est non-AA >60 >60 ml/min/1.718m   Calcium 9.2 8.5 - 10.1 MG/DL    Bilirubin, total 0.2 0.2 - 1.0 MG/DL    ALT (SGPT) 26 16 - 61 U/L    AST (SGOT) 33 15 - 37 U/L    Alk. phosphatase 118 (H) 45 - 117 U/L    Protein, total 8.4 (H) 6.4 - 8.2 g/dL    Albumin 3.9 3.4 - 5.0 g/dL    Globulin 4.5 (H) 2.0 - 4.0 g/dL    A-G Ratio 0.9 0.8 - 1.7      POC G3    Collection Time: 06/06/17  7:31 PM   Result Value Ref Range    Device: VENT      FIO2 (POC) 45 %    pH (POC) 7.229 (LL) 7.35 - 7.45      pCO2 (POC) 47.3 (H) 35.0 - 45.0 MMHG    pO2 (POC) 41 (LL) 80 - 100 MMHG    HCO3 (POC) 19.7 (L) 22 - 26 MMOL/L    sO2 (POC) 67 (L) 92 - 97 %    Base deficit (POC) 8 mmol/L    Mode ASSIST CONTROL      Tidal volume 500 ml    Set Rate 14 bpm    PEEP/CPAP (POC) 5.0 cmH2O    PIP (POC) 27      Allens test (POC) N/A      Inspiratory Time 0.9 sec    Nitric-ppm (POC) 0 ppm    Site RIGHT RADIAL      Patient temp. 37.0      Specimen type (POC) ARTERIAL      Performed by LaZoe Lan   Volume control plus YES     POC G3    Collection Time: 06/06/17  7:41 PM   Result Value Ref Range    Device: VENT      FIO2 (POC) 45 %    pH (POC) 7.247 (LL) 7.35 - 7.45      pCO2 (POC) 43.9 35.0 - 45.0 MMHG    pO2 (POC) 43 (LL) 80 - 100 MMHG    HCO3 (POC) 19.1 (L) 22 - 26 MMOL/L    sO2 (POC) 71 (L) 92 - 97 %    Base deficit (POC) 8 mmol/L    Mode ASSIST CONTROL      Tidal volume 50 ml    Set Rate 14 bpm    PEEP/CPAP (POC) 5.0 cmH2O    PIP (POC) 14      Allens test (POC) N/A      Inspiratory Time 0.9 sec    Nitric-ppm (POC) 0 ppm    Total resp. rate 25      Site RIGHT RADIAL      Patient temp. 37.0      Specimen type (POC) ARTERIAL      Performed by LaZoe Lan   Volume control plus YES  Recent Labs     06/06/17  1941 06/06/17  1931   FIO2I 45 38   HCO3I 19.1* 19.7*   PCO2I 43.9 47.3*   PHI 7.247* 7.229*   PO2I 43* 41*       Telemetry:normal sinus rhythm    Imaging:  I have personally reviewed the patient???s radiographs and have reviewed the reports:    CXR [date]:    CT HEAD/CHEST/ABD/PELVIS [date]:            Total of 45 min critical care time spent at bedside during the course of care providing evaluation,management and care decisions and ordering appropriate treatment related to critical care problems exclusive of procedures.   The reason for providing this level of medical care for this critically ill patient was due a critical illness that impaired one or more vital organ systems such that there was a high probability of imminent or life threatening deterioration in the patients condition. This care involved high complexity decision making to assess, manipulate, and support vital system functions, to treat this degree vital organ system failure and to prevent further life threatening deterioration of the patient???s condition.    Worthy Rancher, PA-C      Behavioral Restraint Face-to-Face Evaluation  (must be completed within one hour of initiation of restraints)      Evaluate immediate situation:  Bedside in ED -with non-purposeful movement of all extremities with high risk for self-extubation in a difficult airway.    Reaction to intervention: calm and comfortable on ventilator    Medical Condition/Assessment: Status epilepticus    Behavioral Condition/Assessment: initially generalized tonic-clonic seizures that improved to intermittent myoclonic jerking    The patient???s review of systems, history, medications, and recent labs were reviewed at this time.     Continue/Discontinue restraints at this time: Continue      Worthy Rancher, PA-C

## 2017-06-07 NOTE — Progress Notes (Signed)
Per Dr Wynelle Link start to titrate diprivan down slowly, watch for seizures when awake may place pt on SBT

## 2017-06-07 NOTE — Progress Notes (Signed)
Urine drug screen and mrsa swab sent

## 2017-06-07 NOTE — Progress Notes (Signed)
06/07/17 1458   Weaning Parameters   Spontaneous Breathing Trial Complete   (Start of SBT)   Resp Rate Observed 21   Ve 403   VT 9   RSBI 41

## 2017-06-07 NOTE — Consults (Signed)
NEUROLOGY CONSULT NOTE    Patient ID:  Nicholas Cummings  [id number]  36 y.o.  Jan 06, 1981    Date of Consultation:  June 07, 2017    Referring Physician: Marilynn Latino MD    Reason for Consultation:  Status Epilepticus        Subjective:       History of Present Illness:     This is a 37 yo man with hx of epilepsy admitted on 27 April with status epilepticus, about 5 GTC. He was loaded with keppra, continued Trileptal via NG tube and intubated and with no further evidence of clinical seizure.     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 Since then pt has been having multiple hospitalizations for recurrent epilepsy. Patient moved from [facility] to [place] last year and was admitted [facility] for unintentional dilantin toxicity in Oct 2018 attributed to taking dilantin for seizure control. As a result, dilantin was discontinued; keppra dose was increased. Patient continued to have ED visits monthly for seizure d/o. In Dec 2018, pt experienced a fall complicated by a small SAH following a seizure activity. Pt was intubated for this admission and subsequently extubated; trileptal added to AED regimen and recommended to see Neurology as outpatient. Patient was recently seen by Dr. Dorthula Nettles of [facility] last Jan 2019 during which his trileptal dose was increased to 450 BID and recommended to continue keppra.     This am pt is still intubated and sedated, no reported seizures like activity.        Patient Active Problem List    Diagnosis Date Noted   ??? Acute respiratory failure ([place]) 06/07/2017   ??? Status epilepticus ([place]) 06/06/2017     Past Medical History:   Diagnosis Date   ??? Seizures ([place])       History reviewed. No pertinent surgical history.   Prior to Admission medications    Medication Sig Start Date End Date Taking? Authorizing Provider   OXcarbazepine (TRILEPTAL) 300 mg tablet Take 300  mg by mouth two (2) times a day.   Yes Provider, Historical   levETIRAcetam (KEPPRA) 750 mg tablet Take 1,500 mg by mouth two (2) times a day.   Yes Provider, Historical     No Known Allergies   Social History     Tobacco Use   ??? Smoking status: Unknown If Ever Smoked   Substance Use Topics   ??? Alcohol use: Not on file      History reviewed. No pertinent family history.       Review of Systems    Review of systems not obtained due to patient factors.    Objective:     Patient Vitals for the past 8 hrs:   BP Temp Pulse Resp SpO2 Height Weight   06/07/17 0845 ??? ??? 63 14 100 % ??? ???   06/07/17 0838 ??? ??? ??? ??? ??? 6' (1.829 m) ???   06/07/17 0800 ??? (!) 94.4 ??F (34.7 ??C) ??? ??? ??? ??? ???   06/07/17 0600 118/74 ??? 61 14 100 % ??? ???   06/07/17 0500 121/75 ??? (!) 58 14 100 % ??? ???   06/07/17 0400 117/79 98.4 ??F (36.9 ??C) 62 14 100 % ??? 66.8 kg (147 lb 4.3 oz)   06/07/17 0300 105/69 ??? 66 14 100 % ??? ???   06/07/17 0200 109/62 ??? 67 ???  100 % ??? ???       General Exam  No acute distress, normal body habitus    HEENT: Normocephalic, atraumatic, Sclera anicteric, normal conjunctiva  Mucous membranes: normal color and hydration     CV: No carotid bruits,   Heart: regular to rate and rhythm. No murmurs     Neurologic Exam:    Mental status:  Intubated, sedated .    Cranial nerves: pupils midline , sluggish responsive to light, no gaze deviation. VOR present.      Motor: no involuntarily movements     Data Review:    Recent Results (from the past 24 hour(s))   CBC WITH AUTOMATED DIFF    Collection Time: 06/06/17  5:00 PM   Result Value Ref Range    WBC 11.9 4.6 - 13.2 K/uL    RBC 4.65 (L) 4.70 - 5.50 M/uL    HGB 13.5 13.0 - 16.0 g/dL    HCT 41.1 36.0 - 48.0 %    MCV 88.4 74.0 - 97.0 FL    MCH 29.0 24.0 - 34.0 PG    MCHC 32.8 31.0 - 37.0 g/dL    RDW 14.2 11.6 - 14.5 %    PLATELET 273 135 - 420 K/uL    MPV 10.7 9.2 - 11.8 FL    NEUTROPHILS 51 40 - 73 %    LYMPHOCYTES 41 21 - 52 %    MONOCYTES 5 3 - 10 %    EOSINOPHILS 3 0 - 5 %    BASOPHILS 0 0 - 2 %    ABS.  NEUTROPHILS 6.0 1.8 - 8.0 K/UL    ABS. LYMPHOCYTES 4.8 (H) 0.9 - 3.6 K/UL    ABS. MONOCYTES 0.6 0.05 - 1.2 K/UL    ABS. EOSINOPHILS 0.4 0.0 - 0.4 K/UL    ABS. BASOPHILS 0.1 0.0 - 0.1 K/UL    DF AUTOMATED     METABOLIC PANEL, COMPREHENSIVE    Collection Time: 06/06/17  5:00 PM   Result Value Ref Range    Sodium 142 136 - 145 mmol/L    Potassium 3.8 3.5 - 5.5 mmol/L    Chloride 111 (H) 100 - 108 mmol/L    CO2 11 (L) 21 - 32 mmol/L    Anion gap 20 (H) 3.0 - 18 mmol/L    Glucose 90 74 - 99 mg/dL    BUN 9 7.0 - 18 MG/DL    Creatinine 1.32 (H) 0.6 - 1.3 MG/DL    BUN/Creatinine ratio 7 (L) 12 - 20      GFR est AA >60 >60 ml/min/1.37m    GFR est non-AA >60 >60 ml/min/1.762m   Calcium 9.2 8.5 - 10.1 MG/DL    Bilirubin, total 0.2 0.2 - 1.0 MG/DL    ALT (SGPT) 26 16 - 61 U/L    AST (SGOT) 33 15 - 37 U/L    Alk. phosphatase 118 (H) 45 - 117 U/L    Protein, total 8.4 (H) 6.4 - 8.2 g/dL    Albumin 3.9 3.4 - 5.0 g/dL    Globulin 4.5 (H) 2.0 - 4.0 g/dL    A-G Ratio 0.9 0.8 - 1.7     POC G3    Collection Time: 06/06/17  7:31 PM   Result Value Ref Range    Device: VENT      FIO2 (POC) 45 %    pH (POC) 7.229 (LL) 7.35 - 7.45      pCO2 (POC) 47.3 (H) 35.0 - 45.0  MMHG    pO2 (POC) 41 (LL) 80 - 100 MMHG    HCO3 (POC) 19.7 (L) 22 - 26 MMOL/L    sO2 (POC) 67 (L) 92 - 97 %    Base deficit (POC) 8 mmol/L    Mode ASSIST CONTROL      Tidal volume 500 ml    Set Rate 14 bpm    PEEP/CPAP (POC) 5.0 cmH2O    PIP (POC) 27      Allens test (POC) N/A      Inspiratory Time 0.9 sec    Nitric-ppm (POC) 0 ppm    Site RIGHT RADIAL      Patient temp. 37.0      Specimen type (POC) ARTERIAL      Performed by Zoe Lan     Volume control plus YES     POC G3    Collection Time: 06/06/17  7:41 PM   Result Value Ref Range    Device: VENT      FIO2 (POC) 45 %    pH (POC) 7.247 (LL) 7.35 - 7.45      pCO2 (POC) 43.9 35.0 - 45.0 MMHG    pO2 (POC) 43 (LL) 80 - 100 MMHG    HCO3 (POC) 19.1 (L) 22 - 26 MMOL/L    sO2 (POC) 71 (L) 92 - 97 %    Base deficit (POC) 8  mmol/L    Mode ASSIST CONTROL      Tidal volume 50 ml    Set Rate 14 bpm    PEEP/CPAP (POC) 5.0 cmH2O    PIP (POC) 14      Allens test (POC) N/A      Inspiratory Time 0.9 sec    Nitric-ppm (POC) 0 ppm    Total resp. rate 25      Site RIGHT RADIAL      Patient temp. 37.0      Specimen type (POC) ARTERIAL      Performed by Zoe Lan     Volume control plus YES     POC G3    Collection Time: 06/07/17  5:19 AM   Result Value Ref Range    Device: VENT      FIO2 (POC) 45 %    pH (POC) 7.316 (L) 7.35 - 7.45      pCO2 (POC) 44.6 35.0 - 45.0 MMHG    pO2 (POC) 253 (H) 80 - 100 MMHG    HCO3 (POC) 22.8 22 - 26 MMOL/L    sO2 (POC) 100 (H) 92 - 97 %    Base deficit (POC) 3 mmol/L    Mode ASSIST CONTROL      Tidal volume 500 ml    Set Rate 14 bpm    PEEP/CPAP (POC) 5 cmH2O    Allens test (POC) YES      Inspiratory Time 0.9 sec    Total resp. rate 14      Site LEFT RADIAL      Patient temp. 98.6      Specimen type (POC) ARTERIAL      Performed by Pati Gallo     Volume control plus YES     T4, FREE    Collection Time: 06/07/17  5:42 AM   Result Value Ref Range    T4, Free 0.9 0.7 - 1.5 NG/DL   TSH 3RD GENERATION    Collection Time: 06/07/17  5:42 AM   Result Value Ref Range    TSH 0.35 (L) 0.36 -  3.74 uIU/mL   CBC WITH AUTOMATED DIFF    Collection Time: 06/07/17  5:42 AM   Result Value Ref Range    WBC 10.0 4.6 - 13.2 K/uL    RBC 4.12 (L) 4.70 - 5.50 M/uL    HGB 11.9 (L) 13.0 - 16.0 g/dL    HCT 35.2 (L) 36.0 - 48.0 %    MCV 85.4 74.0 - 97.0 FL    MCH 28.9 24.0 - 34.0 PG    MCHC 33.8 31.0 - 37.0 g/dL    RDW 14.1 11.6 - 14.5 %    PLATELET 238 135 - 420 K/uL    MPV 10.1 9.2 - 11.8 FL    NEUTROPHILS 73 40 - 73 %    LYMPHOCYTES 17 (L) 21 - 52 %    MONOCYTES 9 3 - 10 %    EOSINOPHILS 1 0 - 5 %    BASOPHILS 0 0 - 2 %    ABS. NEUTROPHILS 7.3 1.8 - 8.0 K/UL    ABS. LYMPHOCYTES 1.6 0.9 - 3.6 K/UL    ABS. MONOCYTES 0.9 0.05 - 1.2 K/UL    ABS. EOSINOPHILS 0.1 0.0 - 0.4 K/UL    ABS. BASOPHILS 0.0 0.0 - 0.1 K/UL    DF AUTOMATED      METABOLIC PANEL, COMPREHENSIVE    Collection Time: 06/07/17  5:42 AM   Result Value Ref Range    Sodium 142 136 - 145 mmol/L    Potassium 3.6 3.5 - 5.5 mmol/L    Chloride 115 (H) 100 - 108 mmol/L    CO2 23 21 - 32 mmol/L    Anion gap 4 3.0 - 18 mmol/L    Glucose 94 74 - 99 mg/dL    BUN 11 7.0 - 18 MG/DL    Creatinine 1.23 0.6 - 1.3 MG/DL    BUN/Creatinine ratio 9 (L) 12 - 20      GFR est AA >60 >60 ml/min/1.77m    GFR est non-AA >60 >60 ml/min/1.771m   Calcium 8.4 (L) 8.5 - 10.1 MG/DL    Bilirubin, total 0.3 0.2 - 1.0 MG/DL    ALT (SGPT) 20 16 - 61 U/L    AST (SGOT) 36 15 - 37 U/L    Alk. phosphatase 107 45 - 117 U/L    Protein, total 6.6 6.4 - 8.2 g/dL    Albumin 3.2 (L) 3.4 - 5.0 g/dL    Globulin 3.4 2.0 - 4.0 g/dL    A-G Ratio 0.9 0.8 - 1.7     MAGNESIUM    Collection Time: 06/07/17  5:42 AM   Result Value Ref Range    Magnesium 2.7 (H) 1.6 - 2.6 mg/dL   PHOSPHORUS    Collection Time: 06/07/17  5:42 AM   Result Value Ref Range    Phosphorus 2.8 2.5 - 4.9 MG/DL   CK    Collection Time: 06/07/17  5:42 AM   Result Value Ref Range    CK 1,093 (H) 39 - 308 U/L   CALCIUM, IONIZED    Collection Time: 06/07/17  5:42 AM   Result Value Ref Range    Ionized Calcium 1.21 1.12 - 1.32 MMOL/L         Radiology studies:   Head CT:   IMPRESSION:  No acute finding. Bifrontal stable encephalomalacia and chronic white matter changes.    Prior frontal craniotomy. There is no hemorrhage, mass, nor acute infarct.  ??    Assessment:     Nicholas  Nicholas Cummings is a 37 yo man with hx of depression, anxiety, head trauma/SAH, epilepsy admitted on 27 April with status epilepticus requiring intubation for airway protection.   This am pt heavily sedated. The head CT neg for acute pathology. Labs significant for elevated CPK most likely secondary to seizures known to cause rhabdo.       Principal Problem:    Acute respiratory failure ([place]) (06/07/2017)    Active Problems:    Status epilepticus ([place]) (06/06/2017)        Plan:     - Continue Keppra  1500 mg BID  - Continue trileptal 450 mg BID   - Rigorous hydration due to concern for developing rhabo which in term can cause renal failure   -  I recommend winning off mechanical ventilation, if any concerns for seizures please let me know, next step will be to load Vimpat 300 mg and continue 100 mg BID    Gasper Sells, MD  Adult Neurologist  06/07/2017

## 2017-06-08 LAB — METABOLIC PANEL, COMPREHENSIVE
A-G Ratio: 0.9 (ref 0.8–1.7)
ALT (SGPT): 21 U/L (ref 16–61)
AST (SGOT): 35 U/L (ref 15–37)
Albumin: 3.1 g/dL — ABNORMAL LOW (ref 3.4–5.0)
Alk. phosphatase: 112 U/L (ref 45–117)
Anion gap: 5 mmol/L (ref 3.0–18)
BUN/Creatinine ratio: 8 — ABNORMAL LOW (ref 12–20)
BUN: 11 MG/DL (ref 7.0–18)
Bilirubin, total: 0.3 MG/DL (ref 0.2–1.0)
CO2: 24 mmol/L (ref 21–32)
Calcium: 8.5 MG/DL (ref 8.5–10.1)
Chloride: 116 mmol/L — ABNORMAL HIGH (ref 100–108)
Creatinine: 1.39 MG/DL — ABNORMAL HIGH (ref 0.6–1.3)
GFR est AA: >60 mL/min/{1.73_m2} (ref >60)
GFR est non-AA: 58 mL/min/{1.73_m2} — ABNORMAL LOW (ref >60)
Globulin: 3.5 g/dL (ref 2.0–4.0)
Glucose: 80 mg/dL (ref 74–99)
Potassium: 3.4 mmol/L — ABNORMAL LOW (ref 3.5–5.5)
Protein, total: 6.6 g/dL (ref 6.4–8.2)
Sodium: 145 mmol/L (ref 136–145)

## 2017-06-08 LAB — CBC WITH AUTOMATED DIFF
ABS. BASOPHILS: 0 10*3/uL (ref 0.0–0.1)
ABS. EOSINOPHILS: 0.1 10*3/uL (ref 0.0–0.4)
ABS. LYMPHOCYTES: 1.8 10*3/uL (ref 0.9–3.6)
ABS. MONOCYTES: 0.6 10*3/uL (ref 0.05–1.2)
ABS. NEUTROPHILS: 6.3 10*3/uL (ref 1.8–8.0)
BASOPHILS: 0 % (ref 0–2)
EOSINOPHILS: 2 % (ref 0–5)
HCT: 35.6 % — ABNORMAL LOW (ref 36.0–48.0)
HGB: 11.6 g/dL — ABNORMAL LOW (ref 13.0–16.0)
LYMPHOCYTES: 20 % — ABNORMAL LOW (ref 21–52)
MCH: 28 PG (ref 24.0–34.0)
MCHC: 32.6 g/dL (ref 31.0–37.0)
MCV: 85.8 FL (ref 74.0–97.0)
MONOCYTES: 7 % (ref 3–10)
MPV: 10.1 FL (ref 9.2–11.8)
NEUTROPHILS: 71 % (ref 40–73)
PLATELET: 230 10*3/uL (ref 135–420)
RBC: 4.15 M/uL — ABNORMAL LOW (ref 4.70–5.50)
RDW: 14.2 % (ref 11.6–14.5)
WBC: 8.8 10*3/uL (ref 4.6–13.2)

## 2017-06-08 LAB — MAGNESIUM: Magnesium: 2.6 mg/dL (ref 1.6–2.6)

## 2017-06-08 LAB — CALCIUM, IONIZED: Ionized Calcium: 1.14 MMOL/L (ref 1.12–1.32)

## 2017-06-08 LAB — MRSA SCREEN - PCR (NASAL)

## 2017-06-08 LAB — PHOSPHORUS: Phosphorus: 3.4 MG/DL (ref 2.5–4.9)

## 2017-06-08 MED ORDER — LEVETIRACETAM 500 MG TAB
500 mg | Freq: Two times a day (BID) | ORAL | Status: DC
Start: 2017-06-08 — End: 2017-06-08

## 2017-06-08 MED ORDER — LACOSAMIDE 50 MG TAB
50 mg | Freq: Two times a day (BID) | ORAL | Status: DC
Start: 2017-06-08 — End: 2017-06-09
  Administered 2017-06-09 (×2): via ORAL

## 2017-06-08 MED ORDER — POTASSIUM CHLORIDE SR 20 MEQ TAB, PARTICLES/CRYSTALS
20 mEq | ORAL | Status: AC
Start: 2017-06-08 — End: 2017-06-08
  Administered 2017-06-08: 10:00:00 via ORAL

## 2017-06-08 MED ORDER — OXCARBAZEPINE 300 MG/5 ML ORAL SUSP
300 mg/5 mL (60 mg/mL) | Freq: Two times a day (BID) | ORAL | Status: DC
Start: 2017-06-08 — End: 2017-06-09
  Administered 2017-06-09: 03:00:00 via ORAL

## 2017-06-08 MED ORDER — LEVETIRACETAM 500 MG TAB
500 mg | Freq: Two times a day (BID) | ORAL | Status: DC
Start: 2017-06-08 — End: 2017-06-08
  Administered 2017-06-08: 17:00:00 via ORAL

## 2017-06-08 MED FILL — NORMOSOL-R INTRAVENOUS SOLUTION: INTRAVENOUS | Qty: 1000

## 2017-06-08 MED FILL — LEVETIRACETAM 500 MG/5 ML IV SOLN: 500 mg/5 mL | INTRAVENOUS | Qty: 15

## 2017-06-08 MED FILL — OXCARBAZEPINE 300 MG/5 ML ORAL SUSP: 300 mg/5 mL (60 mg/mL) | ORAL | Qty: 10

## 2017-06-08 MED FILL — KLOR-CON M20 MEQ TABLET,EXTENDED RELEASE: 20 mEq | ORAL | Qty: 2

## 2017-06-08 NOTE — Progress Notes (Signed)
Re:  [place], Brisk up visit     06/08/2017 2:04 PM    SSN: [id number]    Subjective:   Nicholas Cummings is seen in follow up.  He's extubated and feels well.  No seizures in last 24 hours.  Seizures secondary to what sounds like empyema when he was 19.      Medications:    Current Facility-Administered Medications   Medication Dose Route Frequency Provider Last Rate Last Dose   ??? OXcarbazepine (TRILEPTAL) 300 mg/5 mL (60 mg/mL) oral suspension 450 mg  450 mg Oral Q12H Amada Kingfisher, MD       ??? levETIRAcetam (KEPPRA) tablet 1,500 mg  1,500 mg Oral Q12H Amada Kingfisher, MD       ??? glucose chewable tablet 16 g  4 Tab Oral PRN Ogoy, January-Jill V, PA-C       ??? glucagon (GLUCAGEN) injection 1 mg  1 mg IntraMUSCular PRN Ogoy, January-Jill V, PA-C       ??? dextrose (D50W) injection syrg 12.5-25 g  25-50 mL IntraVENous PRN Ogoy, January-Jill V, PA-C       ??? electrolyte-r (NORMOSOL R) infusion   IntraVENous CONTINUOUS Gasper Sells, MD 125 mL/hr at 06/08/17 0253     ??? sodium chloride (NS) flush 5-40 mL  5-40 mL IntraVENous Q8H Ogoy, January-Jill V, PA-C   10 mL at 06/08/17 0546   ??? sodium chloride (NS) flush 5-40 mL  5-40 mL IntraVENous PRN Ogoy, January-Jill V, PA-C           Vital signs:    Visit Vitals  BP 118/70   Pulse (!) 103   Temp 98.1 ??F (36.7 ??C)   Resp 20   Ht 6' (1.829 m)   Wt 66.8 kg (147 lb 4.3 oz)   SpO2 100%   BMI 19.97 kg/m??       Review of Systems:   As above otherwise 11 point review of systems negative including;   Constitutional no fever or chills  Skin denies rash or itching  HEENT  Denies tinnitus, hearing lose  Eyes denies diplopia vision lose  Respiratory denies sortness of breath  Cardiovascular denies chest pain, dyspnea on exertion  Gastrointestinal denies nausea, vomiting, diarrhea, constipation  Genitourinary denies incontinence  Musculoskeletal denies joint pain or swelling  Endocrine denies weight change   Hematology denies easy bruising or bleeding   Neurological as above in HPI      Patient Active Problem List   Diagnosis Code   ??? Status epilepticus ([place]) G40.901   ??? Acute respiratory failure (HCC) J96.00         Objective: The patient is awake, alert, and oriented x 4.  Fund of knowledge is adequate.  Speech is fluent and memory is intact.  Cranial Nerves: II ??? Visual fields are full to confrontation.  III, IV, VI ??? Extraocular movements are intact. There is no nystagmus. V ??? Facial sensation is intact to pinprick.  VII ??? Face is symmetrical.  VIII - Hearing is present.  IX, X, XII ??? Palate is symmetrical.   XI - Shoulder shrugging and head turning intact  Motor:  The patient moves all four limbs fairly well and symmetrically. Tone is normal. Reflexes are 2+ and symmetrical. Plantars are down going. Gait is not tested.    Final result (Exam End: 06/06/2017 17:22) Provider Status: Open   Study Result     EXAM: CT HEAD WITHOUT CONTRAST.  ??  CLINICAL HISTORY/INDICATION: Witnessed  seizure prior to arrival, Decreased  alertness; seizures, hx of ICH , patient is actively seizing on arrival to  emergency department  ??  COMPARISON: CT head 02/02/2006, MR brain 02/04/2006.  ??  TECHNIQUE: Routine axial images have been obtained from skull base to vertex at  5 mm thick slices.  All CT scans at this facility are performed using dose  optimization technique as appropriate to a performed exam, to include automated  exposure control, adjustment of the mA and/or kV according to patient's size  (including appropriate matching for site-specific examinations), or use of  iterative reconstruction technique.  ??  FINDINGS:  ??  There are no intra nor extra axial fluid collections.  There is no hemorrhage.     The gray white differentiation is normal. Bilateral inferior frontal lobe  encephalomalacia and hypodensity.  ??  The ventricular system is midline without mass effect or shift.   ??   Evidence of previous extensive resection of ethmoid air cells and turbinates.  Bilateral frontal craniotomy. Stable small defect within the medial floor of the  left anterior cranial fossa with extension to the superior aspect of the left  posterior ethmoid air cell.  ??  IMPRESSION  IMPRESSION:  ??  No acute finding.   Bifrontal stable encephalomalacia and chronic white matter changes.    Prior frontal craniotomy.   There is no hemorrhage, mass, nor acute infarct.  ??         I have reviewed the above imagines myself.    CBC:   Lab Results   Component Value Date/Time    WBC 8.8 06/08/2017 02:26 AM    RBC 4.15 (L) 06/08/2017 02:26 AM    HGB 11.6 (L) 06/08/2017 02:26 AM    HCT 35.6 (L) 06/08/2017 02:26 AM    PLATELET 230 06/08/2017 02:26 AM     BMP:   Lab Results   Component Value Date/Time    Glucose 80 06/08/2017 02:26 AM    Sodium 145 06/08/2017 02:26 AM    Potassium 3.4 (L) 06/08/2017 02:26 AM    Chloride 116 (H) 06/08/2017 02:26 AM    CO2 24 06/08/2017 02:26 AM    BUN 11 06/08/2017 02:26 AM    Creatinine 1.39 (H) 06/08/2017 02:26 AM    Calcium 8.5 06/08/2017 02:26 AM     CMP:   Lab Results   Component Value Date/Time    Glucose 80 06/08/2017 02:26 AM    Sodium 145 06/08/2017 02:26 AM    Potassium 3.4 (L) 06/08/2017 02:26 AM    Chloride 116 (H) 06/08/2017 02:26 AM    CO2 24 06/08/2017 02:26 AM    BUN 11 06/08/2017 02:26 AM    Creatinine 1.39 (H) 06/08/2017 02:26 AM    Calcium 8.5 06/08/2017 02:26 AM    Anion gap 5 06/08/2017 02:26 AM    BUN/Creatinine ratio 8 (L) 06/08/2017 02:26 AM    Alk. phosphatase 112 06/08/2017 02:26 AM    Protein, total 6.6 06/08/2017 02:26 AM    Albumin 3.1 (L) 06/08/2017 02:26 AM    Globulin 3.5 06/08/2017 02:26 AM    A-G Ratio 0.9 06/08/2017 02:26 AM     Coagulation: No results found for: PTP, INR, APTT, PTTT  Cardiac markers:   Lab Results   Component Value Date/Time    CK 1,093 (H) 06/07/2017 05:42 AM       Assessment:  Recurrent seizures in this man who has risk factors including  cerebral infection/meningitis vs empyema age 27.  Recent  increase in anti-convulsants now with recurrent breakthrough seizure activity.      Plan:  Continue current Trileptal and Keppra dosing.  Will follow.      Sincerely,        Legrand Como A. Gretchen Portela, M.D.

## 2017-06-08 NOTE — Other (Signed)
Bedside and Verbal shift change report given to Yuliya RN (oncoming nurse) by Joletta M Taguba, RN   (offgoing nurse). Report included the following information Kardex, ED Summary, Procedure Summary, Intake/Output, MAR, Recent Results, Cardiac Rhythm NSR and Alarm Parameters .

## 2017-06-08 NOTE — Progress Notes (Signed)
[facility]  Pulmonary, Critical Care, and Sleep Medicine  Name: Nicholas Cummings MRN: [id number]   DOB: August 24, 1980 Hospital: [facility]   Date: 06/08/2017        Progress note      IMPRESSION:   ?? Acute encephalopathy secondary to status epilepticus with history of epilepsy  ?? Acute respiratory failure due to above, intubated for airway protection - difficult airway (anterior). Extubated 6/72 without complications  ?? 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 Prior hx of head trauma, SAH  ?? Concern for medical non-compliance to AEDs     Patient Active Problem List   Diagnosis Code   ??? Status epilepticus ([place]) G40.901   ??? Acute respiratory failure (HCC) J96.00        RECOMMENDATIONS:   ?? Neuro: continue keppra and trileptal per Neuro recommendations. Propofol gtt discontinued. Appreciated Neuro input  ?? Resp: Titrate FiO2 to keep SpO2 > 90%.  Bronchial hygiene and incentive spirometry    ?? I/D: no suspicion for infectious process. Trend WBC, temp curve and if febrile +/- leukocytosis or leukopenia, consider panculture  ?? Hem/Onc: stable H/H, platelets.    ?? CVS: hemodynamically stable. Latest EF 60% (Echo from 01/2017).  ?? Metabolic: trend electrolytes and replace cautiously as needed given mild elevation in creatinine  ?? Renal: discontinue foley cath, strict I/O. Stable  renal indices  ?? Endocrine: frequent glucose checks while NPO. Avoid hypoglycemia  ?? GI: NPO for now. Consider enteral nutrition in am if patient remains intubated  ?? Musc/Skin: stable, no issues  ?? Code Status: Full code.   ?? Transfer to Neuro floor  ?? Discussed in ICU interdisciplinary rounds  ?? Critical care time 34 minutes      Best practice:  ?? Glycemic control  ?? IHI ICU Bundles:  ??  Vent Bundle Followed, Vent Day 1    ?? Mech Vent patients/ Pulmonary pts:    ?? VAP bundle, Aim to keep peak plateau pressure 25-30cm H2O  ?? Daily sedation holiday as indicated  ?? SBT as tolerated/appropriate  ?? DVT prophylaxis. SCDs.  ?? Need for Lines, foley assessed.  ?? Restraints need.      Subjective/History:    This patient has been seen and evaluated at the request of Dr. Edison Simon for status epilepticus.      06/08/17    Patient is a 37 y.o. male with a medical hx significant for epilepsy on AEDs, anxiety, depression, memory d/o, prior head trauma/ SAH, presented to the ED for generalized tonic-clonic seizures witnessed by family. In ED, pt continued to have seizures requiring intubation for airway protection. Per chart review, intubation was difficult airway due to edema. Neurology was consulted (Dr. Loleta Books) and recommended to increase loading dose of keppra and start trileptal. Patient was started on propofol infusion to control generalized seizure activity. ICU was then consulted for admission.    Pt initially evaluated in ED. Only had intermittent myoclonic jerking of extremities on stimulation. ABGs post-intubation noted and appeared to be mixed venous gas (although SpO2 on monitor 98-100%).    EHR from care everywhere reviewed. 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 Since then pt has been having multiple hospitalizations for recurrent epilepsy. Patient moved from [facility] to [place] last year and was admitted to [facility] for unintentional dilantin toxicity in Oct  2018 attributed to taking dilantin for seizure control. As a result, dilantin was discontinued; keppra dose was increased. Patient continued to have ED visits monthly for seizure d/o. In Dec 2018, pt experienced a fall complicated by a small SAH following a seizure activity. Pt was intubated for this admission and subsequently extubated; trileptal added to AED regimen and recommended to see Neurology as  outpatient. Patient was recently seen by Dr. Dorthula Nettles of [facility] last Jan 2019 during which his trileptal dose was increased to 450 BID and recommended to continue keppra.     The patient is critically ill and can not provide additional history due to Ventilated and sedated.       Past Medical History:   Diagnosis Date   ??? Seizures ([place])         History reviewed. No pertinent surgical history.     Prior to Admission medications    Medication Sig Start Date End Date Taking? Authorizing Provider   OXcarbazepine (TRILEPTAL) 300 mg tablet Take 300 mg by mouth two (2) times a day.   Yes Provider, Historical   levETIRAcetam (KEPPRA) 750 mg tablet Take 1,500 mg by mouth two (2) times a day.   Yes Provider, Historical       Current Facility-Administered Medications   Medication Dose Route Frequency   ??? electrolyte-r (NORMOSOL R) infusion   IntraVENous CONTINUOUS   ??? OXcarbazepine (TRILEPTAL) 300 mg/5 mL (60 mg/mL) oral suspension 450 mg  450 mg Per G Tube Q12H   ??? levETIRAcetam (KEPPRA) 1,500 mg in 0.9% sodium chloride 100 mL IVPB  1,500 mg IntraVENous Q12H   ??? sodium chloride (NS) flush 5-40 mL  5-40 mL IntraVENous Q8H       No Known Allergies     Social History     Tobacco Use   ??? Smoking status: Unknown If Ever Smoked   Substance Use Topics   ??? Alcohol use: Not on file        History reviewed. No pertinent family history.       Review of Systems:  Review of systems not obtained due to patient factors.    Objective:  Vital Signs:    Visit Vitals  BP 116/73   Pulse 71   Temp 98.5 ??F (36.9 ??C)   Resp 20   Ht 6' (1.829 m)   Wt 66.8 kg (147 lb 4.3 oz)   SpO2 100%   BMI 19.97 kg/m??       O2 Device: Room air   O2 Flow Rate (L/min): 4 l/min   Temp (24hrs), Avg:98.4 ??F (36.9 ??C), Min:97.6 ??F (36.4 ??C), Max:99.4 ??F (37.4 ??C)     Intake/Output:   Last shift:      No intake/output data recorded.  Last 3 shifts: 04/27 1901 - 04/29 0700  In: 2847.2 [P.O.:600; I.V.:2247.2]  Out: 1030 [Urine:3555]     Intake/Output Summary (Last 24 hours) at 06/08/2017 1130  Last data filed at 06/08/2017 0600  Gross per 24 hour   Intake 2075 ml   Output 2225 ml   Net -150 ml       Ventilator Settings:  Mode Rate Tidal Volume Pressure FiO2 PEEP   Spontaneous   500 ml  7 cm H2O 35 % 5 cm H20     Peak airway pressure: 16 cm H2O    Minute ventilation: 9.8 l/min      ARDS network Guidelines:   Lung protective strategy and Plateau  Pressure goal < 30 cm  H2O goals  Oxygenation Goals PaO2 55-80 mm Hg or SaO2 88-95%  PH goal 7.30-7.45    VAP bundle:  Reviewed.  Cass tube to suction at 20-30 cm Hg.  Maintain Cass tube with 5-5m air every 4 hours  Routine oral care every 4 hours  Elevation of head > 45 degree  Daily sedation holiday and SBT evaluation starting at 6.00am.    Physical Exam:     General:  Intubated/Sedated    Head:  + frontal plate misaligned from rest of skull   Eyes:  Pink palpebral conjunctivae, anicteric sclerae. Pupils 3 mm b/l brisk and reactive to light   Nose: Nares normal. No drainage    Throat: Intubated    Neck: Supple, symmetrical, trachea midline, no adenopathy, no carotid bruit and no JVD.   Lungs:   Symmetrical chest rise; good AE bilat; CTAB; no wheezes/rhonchi/rales noted.    Heart:  RRR, S1, S2 normal, no m/r/g   Abdomen:   Soft, Bowel sounds normal.    Extremities: Extremities normal, atraumatic, no cyanosis or edema.   Pulses: 2+ and symmetric all extremities.   Skin: Skin color, texture, turgor normal. No rashes or lesions   Neurologic: Sedated however has brief intermittent jerking of random extremities when stimulated   Devices:  ?? ETT: 4/27  ?? OGT: 4/27  ?? Lines: none   ?? Drains: none  ?? Foley: 4/27    Data:     Recent Results (from the past 24 hour(s))   GLUCOSE, POC    Collection Time: 06/07/17 12:28 PM   Result Value Ref Range    Glucose (POC) 81 70 - 110 mg/dL   DRUG SCREEN, URINE    Collection Time: 06/07/17  1:00 PM   Result Value Ref Range    BENZODIAZEPINES POSITIVE (A) NEG       BARBITURATES NEGATIVE  NEG      THC (TH-CANNABINOL) POSITIVE (A) NEG      OPIATES NEGATIVE  NEG      PCP(PHENCYCLIDINE) NEGATIVE  NEG      COCAINE NEGATIVE  NEG      AMPHETAMINES NEGATIVE  NEG      METHADONE NEGATIVE  NEG      HDSCOM (NOTE)    MRSA SCREEN - PCR (NASAL)    Collection Time: 06/07/17  1:10 PM   Result Value Ref Range    Special Requests: NO SPECIAL REQUESTS      Culture result:        MRSA target DNA is not detected (presumptive not colonized with MRSA)   GLUCOSE, POC    Collection Time: 06/07/17  6:20 PM   Result Value Ref Range    Glucose (POC) 85 70 - 110 mg/dL   CBC WITH AUTOMATED DIFF    Collection Time: 06/08/17  2:26 AM   Result Value Ref Range    WBC 8.8 4.6 - 13.2 K/uL    RBC 4.15 (L) 4.70 - 5.50 M/uL    HGB 11.6 (L) 13.0 - 16.0 g/dL    HCT 35.6 (L) 36.0 - 48.0 %    MCV 85.8 74.0 - 97.0 FL    MCH 28.0 24.0 - 34.0 PG    MCHC 32.6 31.0 - 37.0 g/dL    RDW 14.2 11.6 - 14.5 %    PLATELET 230 135 - 420 K/uL    MPV 10.1 9.2 - 11.8 FL    NEUTROPHILS 71 40 - 73 %    LYMPHOCYTES 20 (L) 21 - 52 %  MONOCYTES 7 3 - 10 %    EOSINOPHILS 2 0 - 5 %    BASOPHILS 0 0 - 2 %    ABS. NEUTROPHILS 6.3 1.8 - 8.0 K/UL    ABS. LYMPHOCYTES 1.8 0.9 - 3.6 K/UL    ABS. MONOCYTES 0.6 0.05 - 1.2 K/UL    ABS. EOSINOPHILS 0.1 0.0 - 0.4 K/UL    ABS. BASOPHILS 0.0 0.0 - 0.1 K/UL    DF AUTOMATED     METABOLIC PANEL, COMPREHENSIVE    Collection Time: 06/08/17  2:26 AM   Result Value Ref Range    Sodium 145 136 - 145 mmol/L    Potassium 3.4 (L) 3.5 - 5.5 mmol/L    Chloride 116 (H) 100 - 108 mmol/L    CO2 24 21 - 32 mmol/L    Anion gap 5 3.0 - 18 mmol/L    Glucose 80 74 - 99 mg/dL    BUN 11 7.0 - 18 MG/DL    Creatinine 1.39 (H) 0.6 - 1.3 MG/DL    BUN/Creatinine ratio 8 (L) 12 - 20      GFR est AA >60 >60 ml/min/1.51m    GFR est non-AA 58 (L) >60 ml/min/1.719m   Calcium 8.5 8.5 - 10.1 MG/DL    Bilirubin, total 0.3 0.2 - 1.0 MG/DL    ALT (SGPT) 21 16 - 61 U/L    AST (SGOT) 35 15 - 37 U/L    Alk. phosphatase 112 45 - 117 U/L     Protein, total 6.6 6.4 - 8.2 g/dL    Albumin 3.1 (L) 3.4 - 5.0 g/dL    Globulin 3.5 2.0 - 4.0 g/dL    A-G Ratio 0.9 0.8 - 1.7     MAGNESIUM    Collection Time: 06/08/17  2:26 AM   Result Value Ref Range    Magnesium 2.6 1.6 - 2.6 mg/dL   PHOSPHORUS    Collection Time: 06/08/17  2:26 AM   Result Value Ref Range    Phosphorus 3.4 2.5 - 4.9 MG/DL   CALCIUM, IONIZED    Collection Time: 06/08/17  2:26 AM   Result Value Ref Range    Ionized Calcium 1.14 1.12 - 1.32 MMOL/L           Recent Labs     06/07/17  0519 06/06/17  1941 06/06/17  1931   FIO2I 45 45 45   HCO3I 22.8 19.1* 19.7*   PCO2I 44.6 43.9 47.3*   PHI 7.316* 7.247* 7.229*   PO2I 253* 43* 41*       Telemetry:normal sinus rhythm    Imaging:  I have personally reviewed the patient???s radiographs and have reviewed the reports:    CXR [date]:    CT HEAD/CHEST/ABD/PELVIS [date]:            Total of 45 min critical care time spent at bedside during the course of care providing evaluation,management and care decisions and ordering appropriate treatment related to critical care problems exclusive of procedures.  The reason for providing this level of medical care for this critically ill patient was due a critical illness that impaired one or more vital organ systems such that there was a high probability of imminent or life threatening deterioration in the patients condition. This care involved high complexity decision making to assess, manipulate, and support vital system functions, to treat this degree vital organ system failure and to prevent further life threatening deterioration of the patient???s condition.    LeDianah Field  Verna Czech, MD      Behavioral Restraint Face-to-Face Evaluation  (must be completed within one hour of initiation of restraints)      Evaluate immediate situation:  Bedside in ED -with non-purposeful movement of all extremities with high risk for self-extubation in a difficult airway.    Reaction to intervention: calm and comfortable on ventilator     Medical Condition/Assessment: Status epilepticus    Behavioral Condition/Assessment: initially generalized tonic-clonic seizures that improved to intermittent myoclonic jerking    The patient???s review of systems, history, medications, and recent labs were reviewed at this time.     Continue/Discontinue restraints at this time: Continue      Amada Kingfisher, MD

## 2017-06-08 NOTE — Progress Notes (Addendum)
Pt transferred to [address] Room 402, vital signs stable, pt alert and oriented X4, report was given to [name], [place]

## 2017-06-08 NOTE — Consults (Signed)
[facility]  ICU acceptance and  Transfer Note    Patient: Nicholas Cummings Age: 37 y.o. DOB: 1980/11/12 MR#: [id number] SSN: [id number]  Date: 06/08/2017     Subjective:     Pt was admitted to ICU for breakthrough seizures that turned into status epilepticus and Pt had to be intubated for airway protection.  Pt was seen by neurology and monitored in ICU. Pt became stable, extubated and Tolerated well.  Pt is seen and examined. He states that stopped taking his Keppra as it made him feel dizzy all the time and continued taking Trileptal. He states that he complained to his neurologist but Keppra was not changed.  Currently stable and has no complaints       Assessment/Plan:     Status epilepticus mostly 2 ry to non-compliance with Keppra >> case d/w neurology.  Will hold Keppra and start Pt on Vimpat small dose 50 mg BID  Continue trileptal   IVF  Acute respiratory failure >> resolved  Ativan PRN for breakthrough seizures      Additional Notes:  Will be transferred to floor     Case discussed with:  '[x]'$ Patient  '[]'$ Family  '[]'$ Nursing  '[]'$ Case Management  DVT Prophylaxis:  '[]'$ Lovenox  '[x]'$ Hep SQ  '[]'$ SCDs  '[]'$ Coumadin   '[]'$ On Heparin gtt    Objective:   VS:   Visit Vitals  BP 127/84   Pulse 86   Temp 98.1 ??F (36.7 ??C)   Resp 12   Ht 6' (1.829 m)   Wt 66.8 kg (147 lb 4.3 oz)   SpO2 100%   BMI 19.97 kg/m??      Tmax/24hrs: Temp (24hrs), Avg:98.4 ??F (36.9 ??C), Min:97.8 ??F (36.6 ??C), Max:99.4 ??F (37.4 ??C)      Intake/Output Summary (Last 24 hours) at 06/08/2017 1444  Last data filed at 06/08/2017 0600  Gross per 24 hour   Intake 2075 ml   Output 1725 ml   Net 350 ml       GENERAL: AAO x 3, NAD  HEENT: EOMI, PERLA, no discharge  NECK: No lymphadenopthy or thyroid swelling, JVD not seen  LYMPH: No supraclavicular or cervical or axillary nodes on both sides  CVS: S1S2, No MRG  RS: CTA B/L, -ve Rales, -ve Wheezing  Abd: Soft, non tender, non distended, No guarding, No rigidity   NEURO:  No focal neurologic deficits, CN 2-12 intact  Extrm: -ve LE edema   Skin: No rash  Psych: no signs of depression    Labs:    Recent Results (from the past 24 hour(s))   GLUCOSE, POC    Collection Time: 06/07/17  6:20 PM   Result Value Ref Range    Glucose (POC) 85 70 - 110 mg/dL   CBC WITH AUTOMATED DIFF    Collection Time: 06/08/17  2:26 AM   Result Value Ref Range    WBC 8.8 4.6 - 13.2 K/uL    RBC 4.15 (L) 4.70 - 5.50 M/uL    HGB 11.6 (L) 13.0 - 16.0 g/dL    HCT 35.6 (L) 36.0 - 48.0 %    MCV 85.8 74.0 - 97.0 FL    MCH 28.0 24.0 - 34.0 PG    MCHC 32.6 31.0 - 37.0 g/dL    RDW 14.2 11.6 - 14.5 %    PLATELET 230 135 - 420 K/uL    MPV 10.1 9.2 - 11.8 FL    NEUTROPHILS 71 40 - 73 %  LYMPHOCYTES 20 (L) 21 - 52 %    MONOCYTES 7 3 - 10 %    EOSINOPHILS 2 0 - 5 %    BASOPHILS 0 0 - 2 %    ABS. NEUTROPHILS 6.3 1.8 - 8.0 K/UL    ABS. LYMPHOCYTES 1.8 0.9 - 3.6 K/UL    ABS. MONOCYTES 0.6 0.05 - 1.2 K/UL    ABS. EOSINOPHILS 0.1 0.0 - 0.4 K/UL    ABS. BASOPHILS 0.0 0.0 - 0.1 K/UL    DF AUTOMATED     METABOLIC PANEL, COMPREHENSIVE    Collection Time: 06/08/17  2:26 AM   Result Value Ref Range    Sodium 145 136 - 145 mmol/L    Potassium 3.4 (L) 3.5 - 5.5 mmol/L    Chloride 116 (H) 100 - 108 mmol/L    CO2 24 21 - 32 mmol/L    Anion gap 5 3.0 - 18 mmol/L    Glucose 80 74 - 99 mg/dL    BUN 11 7.0 - 18 MG/DL    Creatinine 1.39 (H) 0.6 - 1.3 MG/DL    BUN/Creatinine ratio 8 (L) 12 - 20      GFR est AA >60 >60 ml/min/1.10m    GFR est non-AA 58 (L) >60 ml/min/1.739m   Calcium 8.5 8.5 - 10.1 MG/DL    Bilirubin, total 0.3 0.2 - 1.0 MG/DL    ALT (SGPT) 21 16 - 61 U/L    AST (SGOT) 35 15 - 37 U/L    Alk. phosphatase 112 45 - 117 U/L    Protein, total 6.6 6.4 - 8.2 g/dL    Albumin 3.1 (L) 3.4 - 5.0 g/dL    Globulin 3.5 2.0 - 4.0 g/dL    A-G Ratio 0.9 0.8 - 1.7     MAGNESIUM    Collection Time: 06/08/17  2:26 AM   Result Value Ref Range    Magnesium 2.6 1.6 - 2.6 mg/dL   PHOSPHORUS    Collection Time: 06/08/17  2:26 AM    Result Value Ref Range    Phosphorus 3.4 2.5 - 4.9 MG/DL   CALCIUM, IONIZED    Collection Time: 06/08/17  2:26 AM   Result Value Ref Range    Ionized Calcium 1.14 1.12 - 1.32 MMOL/L       Signed By: GaBobbye CharlestonMD     June 08, 2017 2:44 PM

## 2017-06-08 NOTE — Progress Notes (Signed)
Received in bed awake and alert. In no acute distress. Denies discomfort. Pt transferred from ICU. Placed in 402.vital signs stable. Tele#[id number] without ectopics. No seizure activity noted.

## 2017-06-08 NOTE — Progress Notes (Signed)
Chaplain attended the interdisciplinary rounds for DELAWRENCE FRIDMAN, who is a 36 y.o.,male. Patient???s Primary Language is: [place].   According to the patient???s EMR Religious Affiliation is: Baptist.     The reason the Patient came to the hospital is:   Patient Active Problem List    Diagnosis Date Noted   ??? Acute respiratory failure (HCC) 06/07/2017   ??? Status epilepticus (HCC) 06/06/2017      Plan:  Chaplains will continue to follow and will provide pastoral care on an as needed/requested basis.  Chaplain recommends bedside caregivers page chaplain on duty if patient shows signs of acute spiritual or emotional distress.    Chaplain Dionicia Abler  Board Certified Chaplain   Spiritual Care   [phone]

## 2017-06-08 NOTE — Consults (Signed)
[facility] Hospitalist Group  ICU acceptance and  Transfer Note    Patient: Nicholas Cummings Age: 37 y.o. DOB: Oct 25, 1980 MR#: [id number] SSN: [id number]  Date: 06/08/2017     Subjective:     Pt was admitted to ICU for breakthrough seizures that turned into status epilepticus and Pt had to be intubated for airway protection.  Pt was seen by neurology and monitored in ICU. Pt became stable, extubated and Tolerated well.  Pt is seen and examined. He states that stopped taking his Keppra as it made him feel dizzy all the time and continued taking Trileptal. He states that he complained to his neurologist but Keppra was not changed.  Currently stable and has no complaints       Assessment/Plan:     Status epilepticus mostly 2 ry to non-compliance with Keppra >> case d/w neurology.  Will hold Keppra and start Pt on Vimpat small dose 50 mg BID  Continue trileptal   IVF  Acute respiratory failure >> resolved  Ativan PRN for breakthrough seizures      Additional Notes:  Will be transferred to floor     Case discussed with:  '[x]' Patient  '[]' Family  '[]' Nursing  '[]' Case Management  DVT Prophylaxis:  '[]' Lovenox  '[x]' Hep SQ  '[]' SCDs  '[]' Coumadin   '[]' On Heparin gtt    Objective:   VS:   Visit Vitals  BP 127/84   Pulse 86   Temp 98.1 ??F (36.7 ??C)   Resp 12   Ht 6' (1.829 m)   Wt 66.8 kg (147 lb 4.3 oz)   SpO2 100%   BMI 19.97 kg/m??      Tmax/24hrs: Temp (24hrs), Avg:98.4 ??F (36.9 ??C), Min:97.8 ??F (36.6 ??C), Max:99.4 ??F (37.4 ??C)      Intake/Output Summary (Last 24 hours) at 06/08/2017 1444  Last data filed at 06/08/2017 0600  Gross per 24 hour   Intake 2075 ml   Output 1725 ml   Net 350 ml       GENERAL: AAO x 3, NAD  HEENT: EOMI, PERLA, no discharge  NECK: No lymphadenopthy or thyroid swelling, JVD not seen  LYMPH: No supraclavicular or cervical or axillary nodes on both sides  CVS: S1S2, No MRG  RS: CTA B/L, -ve Rales, -ve Wheezing  Abd: Soft, non tender, non distended, No guarding, No rigidity  NEURO:  No focal  neurologic deficits, CN 2-12 intact  Extrm: -ve LE edema   Skin: No rash  Psych: no signs of depression    Labs:    Recent Results (from the past 24 hour(s))   GLUCOSE, POC    Collection Time: 06/07/17  6:20 PM   Result Value Ref Range    Glucose (POC) 85 70 - 110 mg/dL   CBC WITH AUTOMATED DIFF    Collection Time: 06/08/17  2:26 AM   Result Value Ref Range    WBC 8.8 4.6 - 13.2 K/uL    RBC 4.15 (L) 4.70 - 5.50 M/uL    HGB 11.6 (L) 13.0 - 16.0 g/dL    HCT 35.6 (L) 36.0 - 48.0 %    MCV 85.8 74.0 - 97.0 FL    MCH 28.0 24.0 - 34.0 PG    MCHC 32.6 31.0 - 37.0 g/dL    RDW 14.2 11.6 - 14.5 %    PLATELET 230 135 - 420 K/uL    MPV 10.1 9.2 - 11.8 FL    NEUTROPHILS 71 40 - 73 %  LYMPHOCYTES 20 (L) 21 - 52 %    MONOCYTES 7 3 - 10 %    EOSINOPHILS 2 0 - 5 %    BASOPHILS 0 0 - 2 %    ABS. NEUTROPHILS 6.3 1.8 - 8.0 K/UL    ABS. LYMPHOCYTES 1.8 0.9 - 3.6 K/UL    ABS. MONOCYTES 0.6 0.05 - 1.2 K/UL    ABS. EOSINOPHILS 0.1 0.0 - 0.4 K/UL    ABS. BASOPHILS 0.0 0.0 - 0.1 K/UL    DF AUTOMATED     METABOLIC PANEL, COMPREHENSIVE    Collection Time: 06/08/17  2:26 AM   Result Value Ref Range    Sodium 145 136 - 145 mmol/L    Potassium 3.4 (L) 3.5 - 5.5 mmol/L    Chloride 116 (H) 100 - 108 mmol/L    CO2 24 21 - 32 mmol/L    Anion gap 5 3.0 - 18 mmol/L    Glucose 80 74 - 99 mg/dL    BUN 11 7.0 - 18 MG/DL    Creatinine 1.39 (H) 0.6 - 1.3 MG/DL    BUN/Creatinine ratio 8 (L) 12 - 20      GFR est AA >60 >60 ml/min/1.83m    GFR est non-AA 58 (L) >60 ml/min/1.752m   Calcium 8.5 8.5 - 10.1 MG/DL    Bilirubin, total 0.3 0.2 - 1.0 MG/DL    ALT (SGPT) 21 16 - 61 U/L    AST (SGOT) 35 15 - 37 U/L    Alk. phosphatase 112 45 - 117 U/L    Protein, total 6.6 6.4 - 8.2 g/dL    Albumin 3.1 (L) 3.4 - 5.0 g/dL    Globulin 3.5 2.0 - 4.0 g/dL    A-G Ratio 0.9 0.8 - 1.7     MAGNESIUM    Collection Time: 06/08/17  2:26 AM   Result Value Ref Range    Magnesium 2.6 1.6 - 2.6 mg/dL   PHOSPHORUS    Collection Time: 06/08/17  2:26 AM   Result Value Ref Range     Phosphorus 3.4 2.5 - 4.9 MG/DL   CALCIUM, IONIZED    Collection Time: 06/08/17  2:26 AM   Result Value Ref Range    Ionized Calcium 1.14 1.12 - 1.32 MMOL/L       Signed By: GaBobbye CharlestonMD     June 08, 2017 2:44 PM

## 2017-06-09 LAB — CBC WITH AUTOMATED DIFF
ABS. BASOPHILS: 0 10*3/uL (ref 0.0–0.1)
ABS. EOSINOPHILS: 0.2 10*3/uL (ref 0.0–0.4)
ABS. LYMPHOCYTES: 2.2 10*3/uL (ref 0.9–3.6)
ABS. MONOCYTES: 0.7 10*3/uL (ref 0.05–1.2)
ABS. NEUTROPHILS: 4.1 10*3/uL (ref 1.8–8.0)
BASOPHILS: 0 % (ref 0–2)
EOSINOPHILS: 3 % (ref 0–5)
HCT: 33.1 % — ABNORMAL LOW (ref 36.0–48.0)
HGB: 11.1 g/dL — ABNORMAL LOW (ref 13.0–16.0)
LYMPHOCYTES: 31 % (ref 21–52)
MCH: 28.4 PG (ref 24.0–34.0)
MCHC: 33.5 g/dL (ref 31.0–37.0)
MCV: 84.7 FL (ref 74.0–97.0)
MONOCYTES: 9 % (ref 3–10)
MPV: 10.4 FL (ref 9.2–11.8)
NEUTROPHILS: 57 % (ref 40–73)
PLATELET: 238 10*3/uL (ref 135–420)
RBC: 3.91 M/uL — ABNORMAL LOW (ref 4.70–5.50)
RDW: 14.1 % (ref 11.6–14.5)
WBC: 7.2 10*3/uL (ref 4.6–13.2)

## 2017-06-09 LAB — METABOLIC PANEL, COMPREHENSIVE
A-G Ratio: 0.8 (ref 0.8–1.7)
ALT (SGPT): 22 U/L (ref 16–61)
AST (SGOT): 36 U/L (ref 15–37)
Albumin: 2.9 g/dL — ABNORMAL LOW (ref 3.4–5.0)
Alk. phosphatase: 100 U/L (ref 45–117)
Anion gap: 9 mmol/L (ref 3.0–18)
BUN/Creatinine ratio: 8 — ABNORMAL LOW (ref 12–20)
BUN: 8 MG/DL (ref 7.0–18)
Bilirubin, total: 0.3 MG/DL (ref 0.2–1.0)
CO2: 27 mmol/L (ref 21–32)
Calcium: 8.3 MG/DL — ABNORMAL LOW (ref 8.5–10.1)
Chloride: 111 mmol/L — ABNORMAL HIGH (ref 100–108)
Creatinine: 1.01 MG/DL (ref 0.6–1.3)
GFR est AA: >60 mL/min/{1.73_m2} (ref >60)
GFR est non-AA: >60 mL/min/{1.73_m2} (ref >60)
Globulin: 3.5 g/dL (ref 2.0–4.0)
Glucose: 95 mg/dL (ref 74–99)
Potassium: 3.3 mmol/L — ABNORMAL LOW (ref 3.5–5.5)
Protein, total: 6.4 g/dL (ref 6.4–8.2)
Sodium: 147 mmol/L — ABNORMAL HIGH (ref 136–145)

## 2017-06-09 LAB — CK: CK: 1349 U/L — ABNORMAL HIGH (ref 39–308)

## 2017-06-09 LAB — MAGNESIUM: Magnesium: 2.1 mg/dL (ref 1.6–2.6)

## 2017-06-09 LAB — PHOSPHORUS: Phosphorus: 3.2 MG/DL (ref 2.5–4.9)

## 2017-06-09 LAB — CALCIUM, IONIZED: Ionized Calcium: 1.11 MMOL/L — ABNORMAL LOW (ref 1.12–1.32)

## 2017-06-09 MED ORDER — HEPARIN (PORCINE) 5,000 UNIT/ML IJ SOLN
5000 unit/mL | Freq: Three times a day (TID) | INTRAMUSCULAR | Status: DC
Start: 2017-06-09 — End: 2017-06-09
  Administered 2017-06-09 (×2): via SUBCUTANEOUS

## 2017-06-09 MED ORDER — LACOSAMIDE 50 MG TAB
50 mg | ORAL | Status: AC
Start: 2017-06-09 — End: 2017-06-09
  Administered 2017-06-09: 17:00:00 via ORAL

## 2017-06-09 MED ORDER — LACOSAMIDE 100 MG TAB
100 mg | ORAL_TABLET | Freq: Two times a day (BID) | ORAL | 0 refills | Status: AC
Start: 2017-06-09 — End: 2017-07-09

## 2017-06-09 MED ORDER — LACOSAMIDE 100 MG TAB
100 mg | ORAL_TABLET | Freq: Two times a day (BID) | ORAL | 0 refills | Status: DC
Start: 2017-06-09 — End: 2017-06-09

## 2017-06-09 MED ORDER — OXCARBAZEPINE 150 MG TAB
150 mg | ORAL_TABLET | Freq: Two times a day (BID) | ORAL | 0 refills | Status: AC
Start: 2017-06-09 — End: 2017-07-09

## 2017-06-09 MED ORDER — LACOSAMIDE 50 MG TAB
50 mg | Freq: Two times a day (BID) | ORAL | Status: DC
Start: 2017-06-09 — End: 2017-06-09

## 2017-06-09 MED ORDER — LORAZEPAM 2 MG/ML IJ SOLN
2 mg/mL | INTRAMUSCULAR | Status: DC | PRN
Start: 2017-06-09 — End: 2017-06-09

## 2017-06-09 MED ORDER — POTASSIUM CHLORIDE SR 20 MEQ TAB, PARTICLES/CRYSTALS
20 mEq | ORAL | Status: AC
Start: 2017-06-09 — End: 2017-06-09
  Administered 2017-06-09: 17:00:00 via ORAL

## 2017-06-09 MED ORDER — OXCARBAZEPINE 300 MG TAB
300 mg | Freq: Two times a day (BID) | ORAL | Status: DC
Start: 2017-06-09 — End: 2017-06-09
  Administered 2017-06-09: 14:00:00 via ORAL

## 2017-06-09 MED FILL — VIMPAT 50 MG TABLET: 50 mg | ORAL | Qty: 1

## 2017-06-09 MED FILL — HEPARIN (PORCINE) 5,000 UNIT/ML IJ SOLN: 5000 unit/mL | INTRAMUSCULAR | Qty: 1

## 2017-06-09 MED FILL — OXCARBAZEPINE 150 MG TAB: 150 mg | ORAL | Qty: 1

## 2017-06-09 MED FILL — OXCARBAZEPINE 300 MG/5 ML ORAL SUSP: 300 mg/5 mL (60 mg/mL) | ORAL | Qty: 10

## 2017-06-09 MED FILL — KLOR-CON M20 MEQ TABLET,EXTENDED RELEASE: 20 mEq | ORAL | Qty: 2

## 2017-06-09 MED FILL — NORMOSOL-R INTRAVENOUS SOLUTION: INTRAVENOUS | Qty: 1000

## 2017-06-09 NOTE — Discharge Summary (Addendum)
Discharge Summary  Patient: Nicholas Cummings MRN: [id number]  CSN: [id number]    Date of Birth: [birth year]  Age: 37 y.o.  Sex: male    DOA: 06/06/2017 LOS:  LOS: 3 days   Discharge Date:      Admission Diagnoses: Status epilepticus (HCC) [G40.901]  Status epilepticus (HCC) [G40.901]    Discharge Diagnoses:  PLEASE SEE DICTATION.     Discharge Condition: Stable    PHYSICAL EXAM  Visit Vitals  BP 144/86 (BP 1 Location: Right arm, BP Patient Position: Sitting)   Pulse 80   Temp 98.3 ??F (36.8 ??C)   Resp 18   Ht 6' (1.829 m)   Wt 66.8 kg (147 lb 4.3 oz)   SpO2 99%   BMI 19.97 kg/m??       General: Alert, cooperative, no acute distress????  Lungs:  CTA Bilaterally. No Wheezing/Rales.  Heart:  Regular rate and Rhythm.  Abdomen: Soft, Non distended, Non tender. + Bowel sounds.  Extremities: No edema/ cyanosis/ clubbing  Psych:???? Good insight.??Not anxious or agitated.  Neurologic:?? AA oriented X 3. Moves all extremities.                                  Hospital Course: Please see dictation. code # [id number].    D/w pt and sister about no driving for 6 months, using heavy machinery at work including no ladders and step stools due to risk for fall with Sz.      Current Discharge Medication List      START taking these medications    Details   lacosamide (VIMPAT) 100 mg tab tablet Take 1 Tab by mouth every twelve (12) hours for 30 days. Max Daily Amount: 200 mg.  Qty: 60 Tab, Refills: 0    Associated Diagnoses: Status epilepticus (HCC)         CONTINUE these medications which have CHANGED    Details   OXcarbazepine (TRILEPTAL) 150 mg tablet Take 3 Tabs by mouth every twelve (12) hours for 30 days.  Qty: 180 Tab, Refills: 0         STOP taking these medications       levETIRAcetam (KEPPRA) 750 mg tablet Comments:   Reason for Stopping:             ?? It is important that you take the medication exactly as they are prescribed.   ?? Keep your medication in the bottles provided by the pharmacist and keep  a list of the medication names, dosages, and times to be taken in your wallet.   ?? Do not take other medications without consulting your doctor.     DIET:  Regular Diet, encourage PO fluid intake     ACTIVITY: Activity as tolerated. No driving for 6 months     ADDITIONAL INFORMATION: If you experience any of the following symptoms but not limited to Fever, chills, nausea, vomiting, diarrhea, change in mentation, falling, bleeding, shortness of breath, chest pain, please call your primary care physician or return to the emergency room if you cannot get hold of your doctor:     FOLLOW UP CARE:  PCP in 5-7 days. Please call and set up an appointment.  Dr. Mare Ferrari, neurology in 2 weeks     Minutes spent on discharge: 40 minutes spent coordinating this discharge (review instructions/follow-up, prescriptions, preparing report for sign off)    Woodward Ku, MD  06/09/2017 12:14 PM

## 2017-06-09 NOTE — Progress Notes (Signed)
Problem: Falls - Risk of  Goal: *Absence of Falls  Description  Document Nicholas Cummings Fall Risk and appropriate interventions in the flowsheet.  06/09/2017 0831 by Maryanna Shape, RN  Outcome: Progressing Towards Goal  06/09/2017 0831 by Maryanna Shape, RN  Outcome: Progressing Towards Goal     Problem: Patient Education: Go to Patient Education Activity  Goal: Patient/Family Education  06/09/2017 0831 by Maryanna Shape, RN  Outcome: Progressing Towards Goal  06/09/2017 0831 by Maryanna Shape, RN  Outcome: Progressing Towards Goal     Problem: Pressure Injury - Risk of  Goal: *Prevention of pressure injury  Description  Document Braden Scale and appropriate interventions in the flowsheet.  06/09/2017 0831 by Maryanna Shape, RN  Outcome: Progressing Towards Goal  06/09/2017 0831 by Maryanna Shape, RN  Outcome: Progressing Towards Goal     Problem: Patient Education: Go to Patient Education Activity  Goal: Patient/Family Education  06/09/2017 0831 by Maryanna Shape, RN  Outcome: Progressing Towards Goal  06/09/2017 0831 by Maryanna Shape, RN  Outcome: Progressing Towards Goal

## 2017-06-09 NOTE — Progress Notes (Signed)
Indigent med prescription has been faxed to outpatient pharmacy.  Informed pt's nurse Harriett Sine and pt.

## 2017-06-09 NOTE — Progress Notes (Signed)
Reason for Admission:  Status epilepticus ([place]) [G40.901]  Status epilepticus ([place]) [G40.901]                 RRAT Score:    4           Plan for utilizing home health:    No                     Likelihood of Readmission:   LOW                         Transition of Care Plan:              Initial assessment completed with patient. Cognitive status of patient: oriented to time, place, person and situation.     Face sheet information confirmed:  No.  The patient designates his mother Ysidro Ramsay  to participate in his discharge plan and to receive any needed information. This patient lives in a single family home with sister.  Patient is able to navigate steps as needed.  Prior to hospitalization, patient was considered to be independent with ADLs/IADLS : yes    Patient has a current ACP document on file: yes  The mother will be available to transport patient home upon discharge.   The patient already has no medical equipment available in the home.     Patient is not currently active with home health.  Patient has not stayed in a skilled nursing facility or rehab.    This patient is on dialysis :no   Currently, the discharge plan is home with family assistance  The patient states that he can obtain his medications from the pharmacy, and take his medications as directed.    Patient's current insurance is  None.   Notified [facility] to see pt.       Care Management Interventions  PCP Verified by CM: No(pt stated he has a pcp but cannot remember the name.  He saw pcp 2 months ago)  Palliative Care Criteria Met (RRAT>21 & CHF Dx)?: No  Mode of Transport at Discharge: Other (see comment)(family)  Transition of Care Consult (CM Consult): Discharge Planning  Physical Therapy Consult: No  Occupational Therapy Consult: No  Speech Therapy Consult: No  Current Support Network: Relative's Home(lives with his sister)  Confirm Follow Up Transport: Family  Plan discussed with Pt/Family/Caregiver: Yes   Discharge Location  Discharge Placement: Home with family assistance      Lequita Halt, BSN RN  Care Management  Pager: [phone]

## 2017-06-09 NOTE — Progress Notes (Signed)
Neurology Progress Note    Patient ID:  Nicholas Cummings  [id number]  37 y.o.  [birth year]    Subjective:      Patient is seen today as follow up after he was admitted in status epilepticus on 27 April and intubated.   This am he is doing great , tolerates PO well, ambulates.     Current Facility-Administered Medications   Medication Dose Route Frequency   ??? LORazepam (ATIVAN) injection 2 mg  2 mg IntraVENous Q2H PRN   ??? OXcarbazepine (TRILEPTAL) tablet 450 mg  450 mg Oral Q12H   ??? heparin (porcine) injection 5,000 Units  5,000 Units SubCUTAneous Q8H   ??? lacosamide (VIMPAT) tablet 50 mg  50 mg Oral Q12H   ??? glucose chewable tablet 16 g  4 Tab Oral PRN   ??? glucagon (GLUCAGEN) injection 1 mg  1 mg IntraMUSCular PRN   ??? dextrose (D50W) injection syrg 12.5-25 g  25-50 mL IntraVENous PRN   ??? electrolyte-r (NORMOSOL R) infusion   IntraVENous CONTINUOUS   ??? sodium chloride (NS) flush 5-40 mL  5-40 mL IntraVENous Q8H   ??? sodium chloride (NS) flush 5-40 mL  5-40 mL IntraVENous PRN          Objective:     Active hospital medications were reviewed    Lab results and neuroradiology studies from the last 24 hours were reviewed.      Prior to Admission medications    Medication Sig Start Date End Date Taking? Authorizing Provider   OXcarbazepine (TRILEPTAL) 300 mg tablet Take 300 mg by mouth two (2) times a day.   Yes Provider, Historical   levETIRAcetam (KEPPRA) 750 mg tablet Take 1,500 mg by mouth two (2) times a day.   Yes Provider, Historical     Patient Vitals for the past 8 hrs:   BP Temp Pulse Resp SpO2   06/09/17 0833 132/88 98.2 ??F (36.8 ??C) 73 18 99 %   06/09/17 0400 123/75 99.2 ??F (37.3 ??C) 82 18 99 %   RRIOCURSHIFT04/28 1901 - 04/30 0700  In: 4420 [P.O.:1320; I.V.:3100]  Out: 4275 [Urine:4275]  04/28 1901 - 04/30 0700  In: 4420 [P.O.:1320; I.V.:3100]  Out: 4275 [Urine:4275]RESULTRCNT(24h)Principal Problem:    Status epilepticus (HCC) (06/06/2017)    Active Problems:    Acute respiratory failure (HCC) (06/07/2017)       Medical non-compliance (06/08/2017)      Medication side effect (06/08/2017)      Additional comments:I reviewed the patient's new clinical lab test results.     General Exam  No acute distress, mucous membranes normal color and hydration status    Neurologic Exam    Mental status:  Alert, oriented to person, place, time and circumstance  Language: normal fluency and comprehension  No visual spatial neglect or overt apraxia  Cranial nerves: PERRL, Extraocular movements intact and full, face symmetric to movement, Tongue midline with normal strength, palat symmetric    Motor: moves all 4 extremities symmetric.      Assessment:     Nicholas Cummings is a 37 y.o. who was admitted with in status epilepticus - now stated he did not tolerate Keppra due to dizziness and he stopped. Now Vimpat was started instead.     Plan:     - Continue Vimpat - 100 mg BID and if any concerns for seizure increase to 200 mg BID  - Continue Trileptal 450 mg BID  - ok to be discharge home and follow up with  his neurologist       Signed:  Purcell Mouton, MD  Adult Neurologist  06/09/2017  11:51 AM

## 2017-06-09 NOTE — Other (Signed)
Bedside and Verbal shift change report given to NANCY RN (oncoming nurse) by LINDA RN (offgoing nurse). Report included the following information SBAR, Kardex and MAR.

## 2017-06-09 NOTE — Discharge Summary (Signed)
[facility]  DISCHARGE SUMMARY    Name:  Nicholas Cummings, Nicholas Cummings  MR#:   [id number]  DOB:  24-May-1980  ACCOUNT #:  [id number]  ADMIT DATE:  06/06/2017  DISCHARGE DATE:  06/09/2017    PRIMARY CARE PHYSICIAN:  He follows up with the neurologist, Dr. Legrand Como.    DISPOSITION:  Discharged home.    DISCHARGE CONDITION:  Stable.    DISCHARGE DIAGNOSES:  1.  Status epilepticus.  2.  Acute respiratory failure due to status epilepticus, status post intubation and extubated on 06/07/2017.  3.  Acute metabolic encephalopathy due to status epilepticus, resolved now.  4.  Noncompliance due to medication.  5.  Mild elevated CK/mild rhabdomyolysis.  6.  Hyperkalemia, repleted in the hospital.  7.  Mild prerenal azotemia, resolved now.    DISCHARGE MEDICATIONS:  1.  Vimpat 100 mg b.i.d.  2.  Trileptal 450 mg twice daily.    MEDICATION STOPPED IN THE HOSPITAL:  Keppra has been discontinued.    CONSULTATIONS IN THE HOSPITAL:  Consultations with Dr. Jule Ser, Neurology; consultation with Dr. Mcarthur Rossetti, ICU Critical Care.    PROCEDURES:  None.    INVESTIGATIONS IN THE HOSPITAL STAY:  The patient had a CTA done on admission, which showed no acute findings.    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  He presented to the ED with status epilepticus, was noted to have acute metabolic encephalopathy due to status epilepticus.  He was intubated for airway protection.  He was admitted to the ICU, started on IV Keppra and Trileptal which he was taking.  He was admitted to ICU, and neurology was consulted.  Neurology saw the patient and recommended to continue medications, Keppra and Trileptal, and also aggressive IV hydration and monitored closely.  The patient responded well.  He became  seizure-free and eventually, the patient was extubated in the ICU, which he tolerated very well.  The patient was monitored overnight in the ICU post extubation.  He did not have any problems.  He was totally weaned off oxygen and was transferred to the floor.  The patient tolerated extubation without any problem.  He is currently being off oxygen and ambulating without any problem.  The patient admitted that he stopped using Keppra because it was giving him dizziness, so neurology decided to change his Keppra to Vimpat as he could not tolerate Keppra.  The patient is currently on Vimpat and Trileptal.  Neurology followed up outpatient today, recommended okay for discharge.  His CK was done repeated today, which is slightly around 1300, but the patient's renal failure improved and he is tolerating feeds p.o. very well.  Discussed with the Neurology team.  Recommends okay for discharge with increased p.o. fluids.  The patient is currently tolerating p.o. without any problem.  Discussed with the patient about change in the medications and followups.  He says he will follow up back with his neurologist, Dr. Mare Ferrari at [facility], and he has a PCP, he cannot recall the name, and he will follow up in a week.  Discussed with the patient about increase fluid intake and recommended about medication compliance and diet compliance.  He completely understood and agreed with the plan.  Answered all his questions and concerns appropriately.    DISCHARGE INSTRUCTIONS, FOLLOWUP APPOINTMENTS, AND PHYSICAL EXAM:  Please refer to the previous discharge summary.  Loleta Books, MD      [id number]  D:  06/09/2017 12:33  T:  06/09/2017 13:42  JOB #:  [id number]  CC:  Gerald Stabs, MD

## 2017-06-09 NOTE — Progress Notes (Addendum)
Bedside and Verbal shift change report received from  Truddie Hidden, RN (off going nurse). Report included the following information SBAR, Kardex, MAR and Recent Results. Assumed care patient in the bathroom,advised to leave the door crack open and pull the call bell when finish.. Fall prevention program maintained,call bell and bedside table within reach. No problems identified at this time,no complaints made,will continue care.    1700- PIV's removed no bleeding noted,tolerated well.HAve patient walk x2 around the floor as per MD's order.. No problems noted. Stable on his feet. AVS explained to patient and sister. Home medication handed to sister.CNA helped patient wheeled out of the floor Sister is driving  the patient home

## 2017-06-09 NOTE — Discharge Summary (Signed)
Discharge Summary by Woodward Ku, MD at 06/09/17 1214                Author: Woodward Ku, MD  Service: Internal Medicine  Author Type: Physician       Filed: 06/09/17 1850  Date of Service: 06/09/17 1214  Status: Addendum          Editor: Woodward Ku, MD (Physician)          Related Notes: Original Note by Woodward Ku, MD (Physician) filed at 06/09/17  1235                    Discharge Summary          Patient: Nicholas Cummings  MRN: [id number]   CSN: [id number]          Date of Birth: 1981-01-08   Age: 37 y.o.   Sex: male      DOA: 06/06/2017  LOS:  LOS: 3 days    Discharge Date:         Admission Diagnoses: Status epilepticus (HCC) [G40.901]   Status epilepticus (HCC) [G40.901]      Discharge Diagnoses:  PLEASE SEE DICTATION.       Discharge Condition: Stable      PHYSICAL EXAM   Visit Vitals      BP  144/86 (BP 1 Location: Right arm, BP Patient Position: Sitting)     Pulse  80     Temp  98.3 ??F (36.8 ??C)     Resp  18     Ht  6' (1.829 m)     Wt  66.8 kg (147 lb 4.3 oz)     SpO2  99%        BMI  19.97 kg/m??           General: Alert, cooperative, no acute distress????   Lungs:  CTA Bilaterally. No Wheezing/Rales.   Heart:  Regular rate and Rhythm.   Abdomen: Soft, Non distended, Non tender. + Bowel sounds.   Extremities: No edema/ cyanosis/ clubbing   Psych:???? Good insight.??Not anxious or agitated.   Neurologic:?? AA oriented X 3. Moves all extremities.                                     Hospital Course: Please see dictation. code # [id number].      D/w pt and sister about no driving for 6 months, using heavy machinery at work including no ladders and step stools due to risk for fall with Sz.           Current Discharge Medication List              START taking these medications          Details        lacosamide (VIMPAT) 100 mg tab tablet  Take 1 Tab by mouth every twelve (12) hours for 30 days. Max Daily Amount: 200 mg.   Qty: 60 Tab, Refills:   0          Associated Diagnoses: Status epilepticus (HCC)                     CONTINUE these medications which have CHANGED          Details        OXcarbazepine (TRILEPTAL) 150 mg tablet  Take 3 Tabs by  mouth every twelve (12) hours for 30 days.   Qty: 180 Tab, Refills:  0                     STOP taking these medications                  levETIRAcetam (KEPPRA) 750 mg tablet  Comments:    Reason for Stopping:                          ??  It is important that you take the medication exactly as they are prescribed.    ??  Keep your medication in the bottles provided by the pharmacist and keep a list of the medication names, dosages, and times to be taken in your wallet.    ??  Do not take other medications without consulting your doctor.       DIET:  Regular Diet, encourage PO fluid intake       ACTIVITY: Activity as tolerated. No driving for 6 months       ADDITIONAL INFORMATION: If you experience any of the following symptoms but not limited to Fever, chills, nausea, vomiting, diarrhea, change in mentation, falling, bleeding, shortness of breath, chest  pain, please call your primary care physician or return to the emergency room if you cannot get hold of your doctor:       FOLLOW UP CARE:   PCP in 5-7 days. Please call and set up an appointment.   Dr. Mare Ferrari, neurology in 2 weeks       Minutes spent on discharge: 40 minutes spent coordinating this discharge (review instructions/follow-up, prescriptions, preparing report for sign off)      Woodward Ku, MD   06/09/2017 12:14 PM

## 2017-06-14 LAB — CANNABINOID, CONFIRM, UR
Cannabinoid: POSITIVE — AB
Carboxy THC confirm.: 131 ng/mL

## 2017-06-14 LAB — 9-DRUG SCREEN W/RFLX CONFIRM, URINE
Amphetamines, urine: NEGATIVE ng/mL
Barbiturates: NEGATIVE ng/mL
Cocaine: NEGATIVE ng/mL
Methadone Screen, Urine: NEGATIVE ng/mL
Opiates: NEGATIVE ng/mL
PROPOXYPHENE: NEGATIVE ng/mL
Phencyclidine: NEGATIVE ng/mL

## 2017-06-14 LAB — BENZODIAZEPINES, CONFIRM, UR: Benzodiazepines: NEGATIVE

## 2017-09-14 DIAGNOSIS — G40209 Localization-related (focal) (partial) symptomatic epilepsy and epileptic syndromes with complex partial seizures, not intractable, without status epilepticus: Secondary | ICD-10-CM | POA: Insufficient documentation

## 2017-11-30 ENCOUNTER — Emergency Department (HOSPITAL_COMMUNITY)
Admission: EM | Admit: 2017-11-30 | Discharge: 2017-11-30 | Disposition: A | Discharge status: Home / Self Care | Payer: Medicare Other | Attending: Emergency Medicine | Admitting: Emergency Medicine

## 2017-11-30 ENCOUNTER — Encounter (HOSPITAL_COMMUNITY): Payer: Self-pay | Admitting: Emergency Medicine

## 2017-11-30 ENCOUNTER — Other Ambulatory Visit: Payer: Self-pay

## 2017-11-30 DIAGNOSIS — R569 Unspecified convulsions: Secondary | ICD-10-CM | POA: Diagnosis not present

## 2017-11-30 DIAGNOSIS — Z79899 Other long term (current) drug therapy: Secondary | ICD-10-CM | POA: Insufficient documentation

## 2017-11-30 LAB — BASIC METABOLIC PANEL
Anion gap: 9 (ref 5–15)
BUN: 13 mg/dL (ref 6–20)
CO2: 22 mmol/L (ref 22–32)
Calcium: 9 mg/dL (ref 8.9–10.3)
Chloride: 107 mmol/L (ref 98–111)
Creatinine, Ser: 0.97 mg/dL (ref 0.61–1.24)
GFR calc Af Amer: >60 mL/min (ref >60)
GFR calc non Af Amer: >60 mL/min (ref >60)
Glucose, Bld: 92 mg/dL (ref 70–99)
Potassium: 5.3 mmol/L — ABNORMAL HIGH (ref 3.5–5.1)
Sodium: 138 mmol/L (ref 135–145)

## 2017-11-30 LAB — CBC
HCT: 41 % (ref 39.0–52.0)
Hemoglobin: 13 g/dL (ref 13.0–17.0)
MCH: 28.3 pg (ref 26.0–34.0)
MCHC: 31.7 g/dL (ref 30.0–36.0)
MCV: 89.1 fL (ref 80.0–100.0)
Platelets: 270 10*3/uL (ref 150–400)
RBC: 4.6 MIL/uL (ref 4.22–5.81)
RDW: 13.6 % (ref 11.5–15.5)
WBC: 6.2 10*3/uL (ref 4.0–10.5)
nRBC: 0 % (ref 0.0–0.2)

## 2017-11-30 LAB — CBG MONITORING, ED: Glucose-Capillary: 93 mg/dL (ref 70–99)

## 2017-11-30 MED ORDER — KETOROLAC TROMETHAMINE 15 MG/ML IJ SOLN
15.0000 mg | Freq: Once | INTRAMUSCULAR | Status: AC
  Administered 2017-11-30: 15 mg via INTRAVENOUS
  Filled 2017-11-30: qty 1

## 2017-11-30 NOTE — ED Notes (Signed)
Bed: WA11 Expected date:  Expected time:  Means of arrival:  Comments: EMS-seizure 

## 2017-11-30 NOTE — ED Notes (Signed)
Patient given discharge teaching and verbalized understanding. Patient ambulated out of ED with a steady gait. 

## 2017-11-30 NOTE — Discharge Instructions (Signed)
Follow up with your neurologist.

## 2017-11-30 NOTE — ED Provider Notes (Signed)
[facility]-EMERGENCY DEPT Provider Note   CSN: [id number] Arrival date & time: 11/30/17  1016     History   Chief Complaint Chief Complaint  Patient presents with  . Seizures    HPI Adam [name] is a 37 y.o. male.  HPI   37 year old male with seizure.  Happened shortly before arrival.  His girlfriend heard a thud from an adjacent room and found him laying on the ground with some increased tone.  No incontinence or oral trauma.  Episodes last about 2 minutes.  Seemed confused afterwards.  Now he is back to his baseline.  He has a history of epilepsy he is on Trileptal and Vimpat.  He reports compliance.  Last seizure was approximately 2 months ago.  He reports poor sleep last night because he cannot get comfortable because of some chronic back pain.  Otherwise he has been in his usual state of health.  History reviewed. No pertinent past medical history.  There are no active problems to display for this patient.   History reviewed. No pertinent surgical history.      Home Medications    Prior to Admission medications   Medication Sig Start Date End Date Taking? Authorizing Provider  Lacosamide (VIMPAT) 100 MG TABS Take 100 mg by mouth 2 (two) times daily.   Yes [provider]  Oxcarbazepine (TRILEPTAL) 300 MG tablet Take 450 mg by mouth 2 (two) times daily.   Yes [provider]    Family History History reviewed. No pertinent family history.  Social History Social History   Tobacco Use  . Smoking status: Never Smoker  . Smokeless tobacco: Never Used  Substance Use Topics  . Alcohol use: Yes  . Drug use: Yes    Types: Marijuana     Allergies   Patient has no known allergies.   Review of Systems Review of Systems  All systems reviewed and negative, other than as noted in HPI.  Physical Exam Updated Vital Signs BP 102/70   Pulse 80   Temp 98.1 F (36.7 C) (Oral)   Resp 16   Ht 5\' 9"  (1.753 m)   Wt 65.8  kg   SpO2 97%   BMI 21.41 kg/m   Physical Exam  Constitutional: He is oriented to person, place, and time. He appears well-developed and well-nourished. No distress.  HENT:  Head: Normocephalic and atraumatic.  Eyes: Pupils are equal, round, and reactive to light. Conjunctivae and EOM are normal. Right eye exhibits no discharge. Left eye exhibits no discharge.  Neck: Neck supple.  Cardiovascular: Normal rate, regular rhythm and normal heart sounds. Exam reveals no gallop and no friction rub.  No murmur heard. Pulmonary/Chest: Effort normal and breath sounds normal. No respiratory distress.  Abdominal: Soft. He exhibits no distension. There is no tenderness.  Musculoskeletal: He exhibits no edema or tenderness.  Neurological: He is alert and oriented to person, place, and time. No cranial nerve deficit. He exhibits normal muscle tone. Coordination normal.  Skin: Skin is warm and dry.  Psychiatric: He has a normal mood and affect. His behavior is normal. Thought content normal.  Nursing note and vitals reviewed.    ED Treatments / Results  Labs (all labs ordered are listed, but only abnormal results are displayed) Labs Reviewed  BASIC METABOLIC PANEL - Abnormal; Notable for the following components:      Result Value   Potassium 5.3 (*)    All other components within normal limits  CBC  CBG  MONITORING, ED    EKG None  Radiology No results found.  Procedures Procedures (including critical care time)  Medications Ordered in ED Medications  ketorolac (TORADOL) 15 MG/ML injection 15 mg (has no administration in time range)     Initial Impression / Assessment and Plan / ED Course  I have reviewed the triage vital signs and the nursing notes.  Pertinent labs & imaging results that were available during my care of the patient were reviewed by me and considered in my medical decision making (see chart for details).     37 year old male with seizure.  He has history of  epilepsy.  He is back to his baseline.  His electrolytes are fine.  He is afebrile.  Neuro exam is nonfocal.  She really has no complaints aside from some back pain which is been a chronic issue for him. Poor sleep last night may have contributed. Sounds like he is reasonably well controlled.  Continue Vimpat and Trileptal.  He needs to follow-up with his neurologist.  Return precautions discussed.  Final Clinical Impressions(s) / ED Diagnoses   Final diagnoses:  Seizure [facility])    ED Discharge Orders    None       Raeford Razor, MD 11/30/17 1137

## 2017-11-30 NOTE — ED Triage Notes (Signed)
Patient arrived by EMS from home. Pt hit the floor at home and had a seizure that lasted about 2 minutes that was witnessed by wife. Hx of seizure . Pt has been taking medication as prescribed, Trileptal. BP 136/82, HR 90, RR 18, SpO2 99% on RA, CBG 130.

## 2017-12-26 ENCOUNTER — Emergency Department (HOSPITAL_COMMUNITY)
Admission: EM | Admit: 2017-12-26 | Discharge: 2017-12-27 | Disposition: A | Discharge status: Home / Self Care | Payer: Medicare Other | Attending: Emergency Medicine | Admitting: Emergency Medicine

## 2017-12-26 DIAGNOSIS — R569 Unspecified convulsions: Secondary | ICD-10-CM | POA: Insufficient documentation

## 2017-12-26 DIAGNOSIS — Z79899 Other long term (current) drug therapy: Secondary | ICD-10-CM | POA: Diagnosis not present

## 2017-12-26 DIAGNOSIS — Z9119 Patient's noncompliance with other medical treatment and regimen: Secondary | ICD-10-CM | POA: Diagnosis not present

## 2017-12-26 DIAGNOSIS — Z91199 Patient's noncompliance with other medical treatment and regimen due to unspecified reason: Secondary | ICD-10-CM

## 2017-12-26 LAB — CBC WITH DIFFERENTIAL/PLATELET
Abs Immature Granulocytes: 0.02 10*3/uL (ref 0.00–0.07)
BASOS PCT: 1 %
Basophils Absolute: 0 10*3/uL (ref 0.0–0.1)
EOS ABS: 0.3 10*3/uL (ref 0.0–0.5)
Eosinophils Relative: 4 %
HCT: 43.5 % (ref 39.0–52.0)
Hemoglobin: 13.8 g/dL (ref 13.0–17.0)
IMMATURE GRANULOCYTES: 0 %
Lymphocytes Relative: 31 %
Lymphs Abs: 2.1 10*3/uL (ref 0.7–4.0)
MCH: 28.9 pg (ref 26.0–34.0)
MCHC: 31.7 g/dL (ref 30.0–36.0)
MCV: 91.2 fL (ref 80.0–100.0)
MONO ABS: 0.7 10*3/uL (ref 0.1–1.0)
MONOS PCT: 10 %
NEUTROS PCT: 54 %
Neutro Abs: 3.6 10*3/uL (ref 1.7–7.7)
PLATELETS: 243 10*3/uL (ref 150–400)
RBC: 4.77 MIL/uL (ref 4.22–5.81)
RDW: 13.5 % (ref 11.5–15.5)
WBC: 6.7 10*3/uL (ref 4.0–10.5)
nRBC: 0 % (ref 0.0–0.2)

## 2017-12-26 LAB — BASIC METABOLIC PANEL
Anion gap: 9 (ref 5–15)
BUN: 10 mg/dL (ref 6–20)
CALCIUM: 9.1 mg/dL (ref 8.9–10.3)
CO2: 27 mmol/L (ref 22–32)
Chloride: 103 mmol/L (ref 98–111)
Creatinine, Ser: 0.99 mg/dL (ref 0.61–1.24)
GFR calc Af Amer: >60 mL/min (ref >60)
GLUCOSE: 99 mg/dL (ref 70–99)
Potassium: 3.6 mmol/L (ref 3.5–5.1)
Sodium: 139 mmol/L (ref 135–145)

## 2017-12-26 LAB — CBG MONITORING, ED: Glucose-Capillary: 85 mg/dL (ref 70–99)

## 2017-12-26 MED ORDER — LACOSAMIDE 50 MG PO TABS
100.0000 mg | ORAL_TABLET | Freq: Two times a day (BID) | ORAL | Status: DC
  Administered 2017-12-27: 100 mg via ORAL
  Filled 2017-12-26: qty 2

## 2017-12-26 MED ORDER — LACOSAMIDE 100 MG PO TABS: 100.0000 mg | ORAL_TABLET | Freq: Two times a day (BID) | ORAL | 0 refills | Status: DC

## 2017-12-26 NOTE — ED Provider Notes (Signed)
[facility]-EMERGENCY DEPT Provider Note   CSN: [id number] Arrival date & time: 12/26/17  2135     History   Chief Complaint Chief Complaint  Patient presents with  . Seizures    HPI Adam [name] is a 37 y.o. male with history of seizures is here for evaluation of seizure.  Seizure occurred immediately PTA.  Patient states he cannot remember the events prior to or immediately after.  This is typical for him when he has a seizure.  His girlfriend was with him, she found him standing up staring off into space and diaphoretic.  She tried to redirect him but he would not talk or move.  After a few seconds he started talking but not making much sense, was disoriented and looking confused.  By the time EMS arrived he was more coherent.  Patient states he has both generalized seizures and also "staring off" seizures.  His last seizure was October 21, he came to the ER for this.  Has noticed a slightly increased dry cough that he attributes to cigarettes.  He uses marijuana to help with his seizures.  Marijuana use is daily.  He denies any alcohol use.  He has been compliant with oxcarbazepine 450 mg twice daily but states he ran out of Vimpat on November 1.  He had recent labs and his Medicaid and states Vimpat will cost him $1000 and he cannot afford this.  He has not established care with neurology since moving from [place].  Patient and girlfriend have been trying to find community resources to help him afford his medicine but have been unable to.  He denies any fevers, preceding head trauma, neck pain or stiffness, vision loss or changes, double vision, nausea, vomiting, headache, unilateral weakness or numbness, bladder or bowel incontinence. HPI  No past medical history on file.  There are no active problems to display for this patient.   No past surgical history on file.      Home Medications    Prior to Admission medications   Medication Sig Start Date End  Date Taking? Authorizing Provider  Lacosamide (VIMPAT) 100 MG TABS Take 1 tablet (100 mg total) by mouth 2 (two) times daily. 12/26/17 01/25/18  Liberty HandyGibbons, Myrka Sylva J, PA-C  Oxcarbazepine (TRILEPTAL) 300 MG tablet Take 450 mg by mouth 2 (two) times daily.    [provider]    Family History No family history on file.  Social History Social History   Tobacco Use  . Smoking status: Never Smoker  . Smokeless tobacco: Never Used  Substance Use Topics  . Alcohol use: Yes  . Drug use: Yes    Types: Marijuana     Allergies   Patient has no known allergies.   Review of Systems Review of Systems  Neurological: Positive for seizures.  All other systems reviewed and are negative.    Physical Exam Updated Vital Signs BP 131/77   Pulse 76   Temp 98 F (36.7 C) (Oral)   Resp 17   Ht 5\' 9"  (1.753 m)   Wt 65.8 kg   SpO2 98%   BMI 21.41 kg/m   Physical Exam  Constitutional: He is oriented to person, place, and time. He appears well-developed and well-nourished. No distress.  NAD.  HENT:  Head: Normocephalic and atraumatic.  Right Ear: External ear normal.  Left Ear: External ear normal.  Nose: Nose normal.  Atraumatic.  No intraoral or tongue injury or bleeding.  Moist mucous members.  Eyes: Conjunctivae  and EOM are normal. No scleral icterus.  Neck: Normal range of motion. Neck supple.  Cardiovascular: Normal rate, regular rhythm, normal heart sounds and intact distal pulses.  No murmur heard. Pulmonary/Chest: Effort normal and breath sounds normal. He has no wheezes.  Musculoskeletal: Normal range of motion. He exhibits no deformity.  Neurological: He is alert and oriented to person, place, and time.  Speech is fluent without obvious dysarthria or aphasia. Strength 5/5 in upper and lower extremities   Sensation to light touch intact in bilateral face, upper and lower extremities No truncal sway. No pronator drift. No leg drop.  Normal finger-to-nose and finger  tapping.  CN II-XII grossly intact bilaterally.   Skin: Skin is warm and dry. Capillary refill takes less than 2 seconds.  Psychiatric: He has a normal mood and affect. His behavior is normal. Judgment and thought content normal.  Nursing note and vitals reviewed.    ED Treatments / Results  Labs (all labs ordered are listed, but only abnormal results are displayed) Labs Reviewed  CBC WITH DIFFERENTIAL/PLATELET  BASIC METABOLIC PANEL  CBG MONITORING, ED    EKG None  Radiology No results found.  Procedures Procedures (including critical care time)  Medications Ordered in ED Medications  lacosamide (VIMPAT) tablet 100 mg (100 mg Oral Given 12/27/17 0041)     Initial Impression / Assessment and Plan / ED Course  I have reviewed the triage vital signs and the nursing notes.  Pertinent labs & imaging results that were available during my care of the patient were reviewed by me and considered in my medical decision making (see chart for details).     Breakthrough seizure likely from medical noncompliance.  Reports a mild cough but is afebrile without tachycardia, hypoxia, normal lung sounds and doubt systemic infection or pneumonia and likely to be the cause to lower seizure threshold.  No preceding head trauma.  No fevers, neck stiffness, meningismus.  Doubt meningitis.  We will check his electrolytes and give 1 dose of Vimpat in the ER.  We will attempt to consult CM/SW to assist with to any medications.  0225: Electrolytes WNL. No seizures in ER. vimpat given in ER.  Unfortunately to SW/CM services in ER this weekend.  Will refer to [facility] for CM.  Return precautions given. Pt in agreement.   Final Clinical Impressions(s) / ED Diagnoses   Final diagnoses:  Seizure [facility])  Medical non-compliance    ED Discharge Orders         Ordered    Lacosamide (VIMPAT) 100 MG TABS  2 times daily     12/26/17 2358           Liberty Handy, PA-C 12/27/17 0225      Nira Conn, MD 12/27/17 0900

## 2017-12-26 NOTE — ED Notes (Signed)
Pt denies any pain, no loss of bowel or bladder. Pt stated that the seizure was probably not "full body" because he is not sore all over like he normally is.

## 2017-12-26 NOTE — ED Notes (Signed)
Bed: WA23 Expected date:  Expected time:  Means of arrival:  Comments: Ems- seizure 

## 2017-12-26 NOTE — ED Triage Notes (Addendum)
Pt BIB [facility] from home. Pt had an unwitnessed seizure tonight. His SO was home and witnessed his postictal phase. CAOx4 with EMS. Pt has been out of one of his seizure medications for a few days, vempat. No signs of trauma on pt. Hx of epilepsy

## 2017-12-27 DIAGNOSIS — R569 Unspecified convulsions: Secondary | ICD-10-CM | POA: Diagnosis not present

## 2017-12-27 NOTE — Discharge Instructions (Addendum)
You were seen in the ER for seizure.  Your labs look normal.  I suspect your break through seizure was from missing your seizure medication.  I have printed out a new prescription for this.  Go to Social Security or [facility] center to determine insurance status as soon as possible .    [facility] accepts patients who are underinsured or without insurance. You can set up an appointment  with a primary care provider who can prescribe your seizure medications long-term.  Follow-up with [facility] neurology for long term management of seizures, or any medication changes.

## 2018-01-10 ENCOUNTER — Emergency Department (HOSPITAL_COMMUNITY)
Admission: EM | Admit: 2018-01-10 | Discharge: 2018-01-10 | Disposition: A | Discharge status: Home / Self Care | Payer: Medicare Other | Attending: Emergency Medicine | Admitting: Emergency Medicine

## 2018-01-10 ENCOUNTER — Other Ambulatory Visit: Payer: Self-pay

## 2018-01-10 ENCOUNTER — Encounter (HOSPITAL_COMMUNITY): Payer: Self-pay

## 2018-01-10 DIAGNOSIS — K0889 Other specified disorders of teeth and supporting structures: Secondary | ICD-10-CM

## 2018-01-10 DIAGNOSIS — F1290 Cannabis use, unspecified, uncomplicated: Secondary | ICD-10-CM

## 2018-01-10 DIAGNOSIS — R569 Unspecified convulsions: Secondary | ICD-10-CM | POA: Diagnosis present

## 2018-01-10 HISTORY — DX: Cannabis use, unspecified, uncomplicated: F12.90

## 2018-01-10 HISTORY — DX: Unspecified convulsions: R56.9

## 2018-01-10 LAB — BASIC METABOLIC PANEL
Anion gap: 14 (ref 5–15)
BUN: 11 mg/dL (ref 6–20)
CO2: 20 mmol/L — ABNORMAL LOW (ref 22–32)
Calcium: 9.1 mg/dL (ref 8.9–10.3)
Chloride: 105 mmol/L (ref 98–111)
Creatinine, Ser: 1.23 mg/dL (ref 0.61–1.24)
GFR calc Af Amer: >60 mL/min (ref >60)
Glucose, Bld: 125 mg/dL — ABNORMAL HIGH (ref 70–99)
Potassium: 3.5 mmol/L (ref 3.5–5.1)
Sodium: 139 mmol/L (ref 135–145)

## 2018-01-10 LAB — CBG MONITORING, ED: Glucose-Capillary: 146 mg/dL — ABNORMAL HIGH (ref 70–99)

## 2018-01-10 MED ORDER — PENICILLIN V POTASSIUM 500 MG PO TABS: 500.0000 mg | ORAL_TABLET | Freq: Four times a day (QID) | ORAL | 0 refills | Status: AC

## 2018-01-10 MED ORDER — LACOSAMIDE 100 MG PO TABS: 100.0000 mg | ORAL_TABLET | Freq: Two times a day (BID) | ORAL | 0 refills | Status: DC

## 2018-01-10 MED ORDER — LACOSAMIDE 50 MG PO TABS
100.0000 mg | ORAL_TABLET | Freq: Two times a day (BID) | ORAL | Status: DC
  Administered 2018-01-10: 100 mg via ORAL
  Filled 2018-01-10: qty 2

## 2018-01-10 NOTE — Discharge Instructions (Signed)

## 2018-01-10 NOTE — ED Provider Notes (Signed)
Emergency Department Provider Note   I have reviewed the triage vital signs and the nursing notes.   HISTORY  Chief Complaint Seizures   HPI Adam [name] is a 37 y.o. male with seizures and marijuana use presents to the ED with seizure at home.  Patient states that he last took Vimpat 1 month ago.  He was seen in the emergency department in mid November for similar presentation and stated at that time is having difficulty affording his Vimpat.  He was referred for outpatient case management but unclear if he is followed up.   Level 5 caveat: Post-ictal    Past Medical History:  Diagnosis Date  . 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 Seizures (HCC)     There are no active problems to display for this patient.   History reviewed. No pertinent surgical history.   Allergies Patient has no known allergies.  No family history on file.  Social History Social History   Tobacco Use  . Smoking status: Never Smoker  . Smokeless tobacco: Never Used  Substance Use Topics  . Alcohol use: Yes    Comment: Ocassional and Socially  . Drug use: Yes    Types: Marijuana    Comment: Chronic user    Review of Systems  Level 5 caveat: Post-ictal.   ____________________________________________   PHYSICAL EXAM:  VITAL SIGNS: Vitals:   01/10/18 0900 01/10/18 1000  BP: 118/78 121/85  Pulse: 80 76  Resp: 17 15  SpO2: 92% 97%    Constitutional: Drowsy with confusion and giving one-word answers.  Eyes: Conjunctivae are normal. PERRL. EOMI. Head: Atraumatic. Nose: No congestion/rhinnorhea. Mouth/Throat: Mucous membranes are moist. Mild abrasion to the left lateral tongue.  Neck: No stridor.  Cardiovascular: Normal rate, regular rhythm. Good peripheral circulation. Grossly normal heart sounds.   Respiratory: Normal respiratory effort.  No retractions. Lungs CTAB. Gastrointestinal: Soft and nontender. No distention.  Musculoskeletal: No lower extremity tenderness  nor edema. No gross deformities of extremities. Normal ROM of the bilateral shoulders.  Neurologic: Drowsy but no speech deficits. No gross focal neurologic deficits are appreciated.  Skin:  Skin is warm, dry and intact. No rash noted.   ____________________________________________   LABS (all labs ordered are listed, but only abnormal results are displayed)  Labs Reviewed  BASIC METABOLIC PANEL - Abnormal; Notable for the following components:      Result Value   CO2 20 (*)    Glucose, Bld 125 (*)    All other components within normal limits  CBG MONITORING, ED - Abnormal; Notable for the following components:   Glucose-Capillary 146 (*)    All other components within normal limits  CBG MONITORING, ED   ____________________________________________  EKG   EKG Interpretation  Date/Time:  Sunday January 10 2018 08:32:41 EST Ventricular Rate:  94 PR Interval:    QRS Duration: 90 QT Interval:  344 QTC Calculation: 431 R Axis:   31 Text Interpretation:  Sinus rhythm No STEMI. Similar to prior.  Confirmed by Alona Bene [phone]) on 01/10/2018 9:42:08 AM Also confirmed by Alona Bene [id number]), editor Barbette Hair [phone])  on 01/10/2018 10:01:32 AM      ____________________________________________   PROCEDURES  Procedure(s) performed:   Procedures  None ____________________________________________   INITIAL IMPRESSION / ASSESSMENT AND PLAN / ED COURSE  Pertinent labs & imaging results that were available during my care of the patient were reviewed by me and considered in my medical decision making (see chart for details).  Patient with known  history of seizure presents with breakthrough seizure.  He is unable to afford his Vimpat and has not taken it in the past month.  Patient's mental status improving while here in the emergency department.  His girlfriend is now at bedside.  She states that he now has Medicaid and they tried to refill the prescription but the  pharmacy would not allow it to be refilled.  They can now afford the medication.  Plan to rewrite the prescription.  They have follow-up with [facility] neurology next week. No concern for meningitis. No indication for neuro imaging. Patient now also describing some left lower dental pain. No evidence of abscess on exam. Soft submandibular compartment. No significant swelling. Plan to cover with Penicillin and will call the Dentist tomorrow.   At this time, I do not feel there is any life-threatening condition present. I have reviewed and discussed all results (EKG, imaging, lab, urine as appropriate), exam findings with patient. I have reviewed nursing notes and appropriate previous records.  I feel the patient is safe to be discharged home without further emergent workup. Discussed usual and customary return precautions. Patient and family (if present) verbalize understanding and are comfortable with this plan.  Patient will follow-up with their primary care provider. If they do not have a primary care provider, information for follow-up has been provided to them. All questions have been answered.  ____________________________________________  FINAL CLINICAL IMPRESSION(S) / ED DIAGNOSES  Final diagnoses:  Seizure (HCC)  Pain, dental     NEW OUTPATIENT MEDICATIONS STARTED DURING THIS VISIT:  Discharge Medication List as of 01/10/2018 10:13 AM    START taking these medications   Details  !! Lacosamide (VIMPAT) 100 MG TABS Take 1 tablet (100 mg total) by mouth 2 (two) times daily., Starting Sun 01/10/2018, Until Tue 02/09/2018, Print    penicillin v potassium (VEETID) 500 MG tablet Take 1 tablet (500 mg total) by mouth 4 (four) times daily for 7 days., Starting Sun 01/10/2018, Until Sun 01/17/2018, Print     !! - Potential duplicate medications found. Please discuss with provider.      Note:  This document was prepared using Dragon voice recognition software and may include unintentional  dictation errors.  Alona BeneJoshua , MD Emergency Medicine    , Arlyss RepressJoshua G, MD 01/10/18 2040

## 2018-01-10 NOTE — ED Notes (Signed)
Bed: WA16 Expected date:  Expected time:  Means of arrival:  Comments: EMS - Seizure 

## 2018-01-10 NOTE — ED Triage Notes (Signed)
Patient comes from home. Patient girlfriend called 911 due to patient having seizure while laying in bed. EMS arrived patient was very postictal. While patient was being transported he became slightly agitated, pulling at O2 Mask and shoving C-Collar away due to patient falling out of bed. Patient is currently somewhat postictal and EMS states patient uses marijuana on a regular basis.  Patient girlfriend states he has been taking Trileptal and Vimpat however patient has run out of Vimpat and has not been able to get medication filled. Unknown how long patient has been out of Vimpat.

## 2018-01-10 NOTE — ED Notes (Signed)
ED Provider at bedside. 

## 2018-01-11 NOTE — Care Management Note (Signed)
Case Management Note  CM consulted for medication assistance.  CM noted pt has active Medicare and Medicaid coverage based on insurance response history.  Pt does not qualify for any other programs through ED CM at this time.  Updated Dr. Jacqulyn BathLong via messages.  No further CM needs noted at this time.  Arva Slaugh, Lynnae SandhoffAngela N, RN 01/11/2018, 9:53 AM

## 2018-01-20 ENCOUNTER — Other Ambulatory Visit: Payer: Self-pay

## 2018-01-20 ENCOUNTER — Encounter (HOSPITAL_COMMUNITY): Payer: Self-pay

## 2018-01-20 ENCOUNTER — Emergency Department (HOSPITAL_COMMUNITY)
Admission: EM | Admit: 2018-01-20 | Discharge: 2018-01-20 | Disposition: A | Discharge status: Home / Self Care | Payer: Medicare Other | Attending: Emergency Medicine | Admitting: Emergency Medicine

## 2018-01-20 ENCOUNTER — Emergency Department (HOSPITAL_COMMUNITY): Payer: Medicare Other

## 2018-01-20 DIAGNOSIS — Z79899 Other long term (current) drug therapy: Secondary | ICD-10-CM | POA: Insufficient documentation

## 2018-01-20 DIAGNOSIS — R569 Unspecified convulsions: Secondary | ICD-10-CM | POA: Insufficient documentation

## 2018-01-20 DIAGNOSIS — R55 Syncope and collapse: Secondary | ICD-10-CM | POA: Diagnosis not present

## 2018-01-20 LAB — URINALYSIS, ROUTINE W REFLEX MICROSCOPIC
Bilirubin Urine: NEGATIVE
Glucose, UA: NEGATIVE mg/dL
HGB URINE DIPSTICK: NEGATIVE
Ketones, ur: 5 mg/dL — AB
Leukocytes, UA: NEGATIVE
Nitrite: NEGATIVE
PH: 5 (ref 5.0–8.0)
Protein, ur: 30 mg/dL — AB
SPECIFIC GRAVITY, URINE: 1.015 (ref 1.005–1.030)

## 2018-01-20 LAB — CBC WITH DIFFERENTIAL/PLATELET
Abs Immature Granulocytes: 0.05 10*3/uL (ref 0.00–0.07)
BASOS PCT: 1 %
Basophils Absolute: 0 10*3/uL (ref 0.0–0.1)
Eosinophils Absolute: 0.2 10*3/uL (ref 0.0–0.5)
Eosinophils Relative: 2 %
HCT: 41 % (ref 39.0–52.0)
Hemoglobin: 13.2 g/dL (ref 13.0–17.0)
Immature Granulocytes: 1 %
Lymphocytes Relative: 11 %
Lymphs Abs: 1 10*3/uL (ref 0.7–4.0)
MCH: 28.8 pg (ref 26.0–34.0)
MCHC: 32.2 g/dL (ref 30.0–36.0)
MCV: 89.5 fL (ref 80.0–100.0)
Monocytes Absolute: 0.6 10*3/uL (ref 0.1–1.0)
Monocytes Relative: 7 %
Neutro Abs: 6.8 10*3/uL (ref 1.7–7.7)
Neutrophils Relative %: 78 %
Platelets: 284 10*3/uL (ref 150–400)
RBC: 4.58 MIL/uL (ref 4.22–5.81)
RDW: 13.8 % (ref 11.5–15.5)
WBC: 8.6 10*3/uL (ref 4.0–10.5)
nRBC: 0 % (ref 0.0–0.2)

## 2018-01-20 LAB — RAPID URINE DRUG SCREEN, HOSP PERFORMED
Amphetamines: NOT DETECTED
BENZODIAZEPINES: NOT DETECTED
Barbiturates: NOT DETECTED
Cocaine: NOT DETECTED
Opiates: NOT DETECTED
Tetrahydrocannabinol: POSITIVE — AB

## 2018-01-20 LAB — BASIC METABOLIC PANEL
Anion gap: 11 (ref 5–15)
BUN: 11 mg/dL (ref 6–20)
CHLORIDE: 109 mmol/L (ref 98–111)
CO2: 21 mmol/L — ABNORMAL LOW (ref 22–32)
Calcium: 9.2 mg/dL (ref 8.9–10.3)
Creatinine, Ser: 0.98 mg/dL (ref 0.61–1.24)
GFR calc Af Amer: >60 mL/min (ref >60)
GFR calc non Af Amer: >60 mL/min (ref >60)
Glucose, Bld: 100 mg/dL — ABNORMAL HIGH (ref 70–99)
Potassium: 4 mmol/L (ref 3.5–5.1)
SODIUM: 141 mmol/L (ref 135–145)

## 2018-01-20 MED ORDER — SODIUM CHLORIDE 0.9 % IV BOLUS
1000.0000 mL | Freq: Once | INTRAVENOUS | Status: AC
  Administered 2018-01-20: 1000 mL via INTRAVENOUS

## 2018-01-20 MED ORDER — OXCARBAZEPINE 300 MG PO TABS: 450.0000 mg | ORAL_TABLET | Freq: Two times a day (BID) | ORAL | 0 refills | Status: DC

## 2018-01-20 NOTE — ED Notes (Signed)
Patient is getting dressed and will leave room ASAP.

## 2018-01-20 NOTE — ED Triage Notes (Signed)
Patient arrived via [facility]. Patient is ambulatory an AOx4 at baseline however is still postictal from seizure so patient is AO to self as of now. Patient wife is at bedside with patient.   Patient had 2x seizures, first one lasting about 30 seconds and the second one happened about 5-6 minutes later and lasted about 60 to 120 seconds.

## 2018-01-20 NOTE — ED Provider Notes (Signed)
[facility]-EMERGENCY DEPT Provider Note   CSN: [id number] Arrival date & time: 01/20/18  0915     History   Chief Complaint Chief Complaint  Patient presents with  . Seizures    HPI Adam [name] is a 37 y.o. male.  Patient is a 37 year old male with past medical history of seizure disorder.  Nearly 20 years ago he reports having surgery to have a metal plate put in his head after what sounds like a sinus infection with osteomyelitis.  Patient has been on Vimpat and Trileptal for quite some time.  They recently relocated to the area and are following with a neurologist at [facility]  He has recently been increasing his dose of Vimpat and decreasing the dose of Trileptal as per his neurologist recommendation.  This morning he experienced 2 episodes of seizure-like activity which the significant other describes as generalized shaking and loss of consciousness.  Each of these episodes lasted several minutes, then resolved spontaneously.  He was transported here by EMS.  He denies any recent illness.  He denies any fevers, chills, or headache.  The history is provided by the patient and the spouse.  Seizures   This is a recurrent problem. The current episode started less than 1 hour ago. The problem has been resolved. There were 2 to 3 seizures. The most recent episode lasted 2 to 5 minutes. Characteristics include rhythmic jerking and loss of consciousness. Characteristics do not include bowel incontinence or bladder incontinence. The seizure(s) had no focality. Possible causes include medication or dosage change. There has been no fever.    Past Medical History:  Diagnosis Date  . 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 Seizures (HCC)     There are no active problems to display for this patient.   History reviewed. No pertinent surgical history.      Home Medications    Prior to Admission medications   Medication Sig Start Date End Date Taking?  Authorizing Provider  Aspirin-Caffeine (BC FAST PAIN RELIEF PO) Take 1 packet by mouth 2 (two) times daily as needed (pain).    [provider]  Lacosamide (VIMPAT) 100 MG TABS Take 1 tablet (100 mg total) by mouth 2 (two) times daily. 12/26/17 01/25/18  Liberty Handy, PA-C  Lacosamide (VIMPAT) 100 MG TABS Take 1 tablet (100 mg total) by mouth 2 (two) times daily. 01/10/18 02/09/18  LongArlyss Repress, MD  Oxcarbazepine (TRILEPTAL) 300 MG tablet Take 450 mg by mouth 2 (two) times daily.    [provider]    Family History No family history on file.  Social History Social History   Tobacco Use  . Smoking status: Never Smoker  . Smokeless tobacco: Never Used  Substance Use Topics  . Alcohol use: Yes    Comment: Ocassional and Socially  . Drug use: Yes    Types: Marijuana    Comment: Chronic user     Allergies   Patient has no known allergies.   Review of Systems Review of Systems  Gastrointestinal: Negative for bowel incontinence.  Genitourinary: Negative for bladder incontinence.  Neurological: Positive for seizures and loss of consciousness.  All other systems reviewed and are negative.    Physical Exam Updated Vital Signs BP 128/84 (BP Location: Right Arm)   Pulse 89   Temp 97.8 F (36.6 C) (Oral)   Resp 17   Ht 5\' 9"  (1.753 m)   Wt 65.7 kg   SpO2 99%   BMI 21.39 kg/m  Physical Exam  Constitutional: He is oriented to person, place, and time. He appears well-developed and well-nourished. No distress.  HENT:  Head: Normocephalic and atraumatic.  Mouth/Throat: Oropharynx is clear and moist.  Eyes: Pupils are equal, round, and reactive to light. EOM are normal.  Neck: Normal range of motion. Neck supple.  Cardiovascular: Normal rate and regular rhythm. Exam reveals no friction rub.  No murmur heard. Pulmonary/Chest: Effort normal and breath sounds normal. No respiratory distress. He has no wheezes. He has no rales.  Abdominal: Soft. Bowel  sounds are normal. He exhibits no distension. There is no tenderness.  Musculoskeletal: Normal range of motion. He exhibits no edema.  Neurological: He is alert and oriented to person, place, and time. No cranial nerve deficit. He exhibits normal muscle tone. Coordination normal.  Skin: Skin is warm and dry. He is not diaphoretic.  Nursing note and vitals reviewed.    ED Treatments / Results  Labs (all labs ordered are listed, but only abnormal results are displayed) Labs Reviewed - No data to display  EKG None  Radiology No results found.  Procedures Procedures (including critical care time)  Medications Ordered in ED Medications  sodium chloride 0.9 % bolus 1,000 mL (has no administration in time range)     Initial Impression / Assessment and Plan / ED Course  I have reviewed the triage vital signs and the nursing notes.  Pertinent labs & imaging results that were available during my care of the patient were reviewed by me and considered in my medical decision making (see chart for details).  Patient is a 37 year old male brought by EMS after experiencing 2 seizures at home this morning.  He has a history of seizure disorder has an artificial plate in his head that was placed secondary to sinus issues.  His work-up today reveals unremarkable electrolytes and CT scan of the head is also unremarkable.  He has been observed for several hours in the ER and is experienced no additional seizure activity.  He is on no medications that we can check levels of.  While patient was in the ER, he did contact his neurologist at [facility] who made recommendations for medication dose changes.  The patient will follow these instructions and follow up with his neurologist on Monday as scheduled.  Final Clinical Impressions(s) / ED Diagnoses   Final diagnoses:  None    ED Discharge Orders    None       Geoffery Lyonselo, Saleena Tamas, MD 01/20/18 1348

## 2018-01-20 NOTE — Discharge Instructions (Addendum)
Begin taking Trileptal 450 mg twice daily as recommended by your neurologist.  Aloe up with neurology as scheduled on Monday.

## 2018-01-20 NOTE — ED Notes (Signed)
ED Provider at bedside. 

## 2018-01-20 NOTE — ED Notes (Signed)
Bed: WA15 Expected date:  Expected time:  Means of arrival:  Comments: EMS- seizure 

## 2018-01-20 NOTE — ED Notes (Signed)
Patient transported to CT 

## 2018-02-22 ENCOUNTER — Other Ambulatory Visit: Payer: Self-pay

## 2018-02-22 ENCOUNTER — Emergency Department (HOSPITAL_COMMUNITY)
Admission: EM | Admit: 2018-02-22 | Discharge: 2018-02-22 | Disposition: A | Discharge status: Home / Self Care | Payer: Medicare Other | Attending: Emergency Medicine | Admitting: Emergency Medicine

## 2018-02-22 ENCOUNTER — Encounter (HOSPITAL_COMMUNITY): Payer: Self-pay

## 2018-02-22 ENCOUNTER — Emergency Department (HOSPITAL_COMMUNITY)
Admission: EM | Admit: 2018-02-22 | Discharge: 2018-02-23 | Disposition: A | Discharge status: Home / Self Care | Payer: Medicare Other | Source: Home / Self Care | Attending: Emergency Medicine | Admitting: Emergency Medicine

## 2018-02-22 DIAGNOSIS — F1210 Cannabis abuse, uncomplicated: Secondary | ICD-10-CM | POA: Diagnosis not present

## 2018-02-22 DIAGNOSIS — Z79899 Other long term (current) drug therapy: Secondary | ICD-10-CM

## 2018-02-22 DIAGNOSIS — G40919 Epilepsy, unspecified, intractable, without status epilepticus: Secondary | ICD-10-CM | POA: Insufficient documentation

## 2018-02-22 DIAGNOSIS — R569 Unspecified convulsions: Secondary | ICD-10-CM | POA: Diagnosis present

## 2018-02-22 LAB — BASIC METABOLIC PANEL
Anion gap: 11 (ref 5–15)
BUN: 9 mg/dL (ref 6–20)
CO2: 22 mmol/L (ref 22–32)
Calcium: 8.8 mg/dL — ABNORMAL LOW (ref 8.9–10.3)
Chloride: 108 mmol/L (ref 98–111)
Creatinine, Ser: 1.02 mg/dL (ref 0.61–1.24)
GFR calc Af Amer: >60 mL/min (ref >60)
GFR calc non Af Amer: >60 mL/min (ref >60)
Glucose, Bld: 108 mg/dL — ABNORMAL HIGH (ref 70–99)
Potassium: 3.5 mmol/L (ref 3.5–5.1)
Sodium: 141 mmol/L (ref 135–145)

## 2018-02-22 LAB — CBC WITH DIFFERENTIAL/PLATELET
Abs Immature Granulocytes: 0.03 10*3/uL (ref 0.00–0.07)
Basophils Absolute: 0 10*3/uL (ref 0.0–0.1)
Basophils Relative: 1 %
Eosinophils Absolute: 0.2 10*3/uL (ref 0.0–0.5)
Eosinophils Relative: 3 %
HCT: 38.4 % — ABNORMAL LOW (ref 39.0–52.0)
Hemoglobin: 12.3 g/dL — ABNORMAL LOW (ref 13.0–17.0)
Immature Granulocytes: 1 %
Lymphocytes Relative: 19 %
Lymphs Abs: 1.2 10*3/uL (ref 0.7–4.0)
MCH: 29.7 pg (ref 26.0–34.0)
MCHC: 32 g/dL (ref 30.0–36.0)
MCV: 92.8 fL (ref 80.0–100.0)
Monocytes Absolute: 0.4 10*3/uL (ref 0.1–1.0)
Monocytes Relative: 7 %
Neutro Abs: 4.3 10*3/uL (ref 1.7–7.7)
Neutrophils Relative %: 69 %
Platelets: 237 10*3/uL (ref 150–400)
RBC: 4.14 MIL/uL — ABNORMAL LOW (ref 4.22–5.81)
RDW: 13 % (ref 11.5–15.5)
WBC: 6.1 10*3/uL (ref 4.0–10.5)
nRBC: 0 % (ref 0.0–0.2)

## 2018-02-22 NOTE — ED Triage Notes (Addendum)
Per EMS, patient coming from home with complaints of seizures. He has had three seizures within 24 hours and has had one seizures since being discharged from the facility earlier today.   Takes seizure medications (Vimpat and Trileptal) as prescribed.   Upon arrival, patient was vomiting which is not normal for his post ictal state. Family member at bedside states that today he has been vomiting after seizures and having longer periods of confusion.

## 2018-02-22 NOTE — ED Provider Notes (Signed)
[facility]-EMERGENCY DEPT Provider Note   CSN: [id number] Arrival date & time: 02/22/18  1749     History   Chief Complaint Chief Complaint  Patient presents with  . Seizures    HPI Adam [name] is a 38 y.o. male.  HPI   [name] with seizures. Hx of the same. On vimpat and trileptal. Reports compliance. Has a neurologist and actually spoke with them while in the ED. Going to increase meds. Inside of his R cheek hurts. He thinks he bit it. No other acute complaints. Did hit head and small hematoma to R forehead but denies significant pain there. No fever. Has been sleeping ok.   Past Medical History:  Diagnosis Date  . 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 Seizures (HCC)     There are no active problems to display for this patient.   No past surgical history on file.      Home Medications    Prior to Admission medications   Medication Sig Start Date End Date Taking? Authorizing Provider  ibuprofen (ADVIL,MOTRIN) 800 MG tablet Take 800 mg by mouth daily. 01/18/18   [provider]  Lacosamide (VIMPAT) 100 MG TABS Take 1 tablet (100 mg total) by mouth 2 (two) times daily. Patient taking differently: Take 150 mg by mouth 2 (two) times daily.  01/10/18 02/09/18  LongArlyss Repress, MD  Oxcarbazepine (TRILEPTAL) 300 MG tablet Take 1.5 tablets (450 mg total) by mouth 2 (two) times daily. 01/20/18   Geoffery Lyons, MD    Family History No family history on file.  Social History Social History   Tobacco Use  . Smoking status: Never Smoker  . Smokeless tobacco: Never Used  Substance Use Topics  . Alcohol use: Yes    Comment: Ocassional and Socially  . Drug use: Yes    Types: Marijuana    Comment: Chronic user     Allergies   Patient has no known allergies.   Review of Systems Review of Systems  All systems reviewed and negative, other than as noted in HPI.  Physical Exam Updated Vital Signs BP 108/67   Pulse 89   Temp 97.6  F (36.4 C)   Resp 16   SpO2 99%   Physical Exam Vitals signs and nursing note reviewed.  Constitutional:      General: He is not in acute distress.    Appearance: He is well-developed.  HENT:     Head: Normocephalic.      Comments: Small hematoma in pictured area. No significant tenderness. No midline spinal tenderness. I do not see any trauma to R cheek or intraorally otherwise.  Eyes:     General:        Right eye: No discharge.        Left eye: No discharge.     Conjunctiva/sclera: Conjunctivae normal.  Neck:     Musculoskeletal: Neck supple.  Cardiovascular:     Rate and Rhythm: Normal rate and regular rhythm.     Heart sounds: Normal heart sounds. No murmur. No friction rub. No gallop.   Pulmonary:     Effort: Pulmonary effort is normal. No respiratory distress.     Breath sounds: Normal breath sounds.  Abdominal:     General: There is no distension.     Palpations: Abdomen is soft.     Tenderness: There is no abdominal tenderness.  Musculoskeletal:        General: No tenderness.  Skin:    General: Skin is  warm and dry.  Neurological:     General: No focal deficit present.     Mental Status: He is alert and oriented to person, place, and time.     Cranial Nerves: No cranial nerve deficit.     Sensory: No sensory deficit.     Motor: No weakness.     Coordination: Coordination normal.  Psychiatric:        Behavior: Behavior normal.        Thought Content: Thought content normal.      ED Treatments / Results  Labs (all labs ordered are listed, but only abnormal results are displayed) Labs Reviewed  CBC WITH DIFFERENTIAL/PLATELET - Abnormal; Notable for the following components:      Result Value   RBC 4.14 (*)    Hemoglobin 12.3 (*)    HCT 38.4 (*)    All other components within normal limits  BASIC METABOLIC PANEL - Abnormal; Notable for the following components:   Glucose, Bld 108 (*)    Calcium 8.8 (*)    All other components within normal limits     EKG None  Radiology No results found.  Procedures Procedures (including critical care time)  Medications Ordered in ED Medications - No data to display   Initial Impression / Assessment and Plan / ED Course  I have reviewed the triage vital signs and the nursing notes.  Pertinent labs & imaging results that were available during my care of the patient were reviewed by me and considered in my medical decision making (see chart for details).    [name] with seizure. Hx of the same. Back to baseline. Afebrile. Nonfocal neuro exam. Neurologist just increased meds. Outpt FU. It has been determined that no acute conditions requiring further emergency intervention are present at this time. The patient has been advised of the diagnosis and plan. I reviewed any labs and imaging including any potential incidental findings. We have discussed signs and symptoms that warrant return to the ED and they are listed in the discharge instructions.    Final Clinical Impressions(s) / ED Diagnoses   Final diagnoses:  Seizure [facility])    ED Discharge Orders    None       Raeford Razor, MD 02/22/18 1911

## 2018-02-22 NOTE — Discharge Instructions (Addendum)
Follow-up with your neurologist as recommended.

## 2018-02-22 NOTE — ED Triage Notes (Signed)
Transported by [facility] from home-- hx of seizures, experienced a focal (then full) seizure today that lasted approximately 5 minutes. Recent change in medication; +post ictal however reports medication complianc & patient is  AAO x 4 upon arrival. 22 G in left thumb placed by EMS. VSS. CBG 124 mg/dl.

## 2018-02-23 DIAGNOSIS — G40919 Epilepsy, unspecified, intractable, without status epilepticus: Secondary | ICD-10-CM | POA: Diagnosis not present

## 2018-02-23 MED ORDER — LORAZEPAM 2 MG/ML IJ SOLN
2.0000 mg | Freq: Once | INTRAMUSCULAR | Status: AC
  Administered 2018-02-23: 2 mg via INTRAVENOUS
  Filled 2018-02-23: qty 1

## 2018-02-23 MED ORDER — LORAZEPAM 2 MG/ML IJ SOLN: 1.0000 mg | Freq: Once | INTRAMUSCULAR | Status: DC

## 2018-02-23 NOTE — ED Notes (Signed)
Patient experienced a seizure that lasted about 5 minutes (started at 0129 and stopped at 0134). Patient started biting on bottom lip/staring off into space and family member stated that this is typical seizure activity for patient. Ativan administered during episode. SpO2 dropped to 88% during episode, so placed patient on 2L of O2 via [place]. Patient is currently post ictal.

## 2018-02-23 NOTE — ED Provider Notes (Signed)
[facility]-EMERGENCY DEPT Provider Note: Lowella Dell, MD, FACEP  CSN: [id number] MRN: [id number] ARRIVAL: 02/22/18 at 2306 ROOM: WA19/WA19   CHIEF COMPLAINT  Seizure   HISTORY OF PRESENT ILLNESS  02/23/18 12:56 AM Adam [name] is a 38 y.o. male with a seizure disorder who was seen by his neurologist at [facility] yesterday.  He had an EEG which showed a seizure focus of the right temporal lobe.  He was seen in this department yesterday after having 2 seizures despite being on Vimpat and Trileptal (his neurologist plans to transition him from Trileptal to Keppra).  After being discharged home from the ED he had another seizure.  The seizure had a prodrome of him folding clothes which is typical of his seizures.  On arrival the patient vomited which is not normal for his postictal state although he sometimes vomits during the seizures.  He continues to be somewhat confused.  He did bite the right side of his tongue.   Past Medical History:  Diagnosis Date  . 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 Seizures (HCC)     History reviewed. No pertinent surgical history.  History reviewed. No pertinent family history.  Social History   Tobacco Use  . Smoking status: Never Smoker  . Smokeless tobacco: Never Used  Substance Use Topics  . Alcohol use: Yes    Comment: Ocassional and Socially  . Drug use: Yes    Types: Marijuana    Comment: Chronic user    Prior to Admission medications   Medication Sig Start Date End Date Taking? Authorizing Provider  ibuprofen (ADVIL,MOTRIN) 800 MG tablet Take 800 mg by mouth daily. 01/18/18  Yes [provider]  Lacosamide (VIMPAT) 100 MG TABS Take 1 tablet (100 mg total) by mouth 2 (two) times daily. Patient taking differently: Take 150 mg by mouth 2 (two) times daily.  01/10/18 02/23/18 Yes Long, Arlyss Repress, MD  Oxcarbazepine (TRILEPTAL) 300 MG tablet Take 1.5 tablets (450 mg total) by mouth 2 (two) times daily. 01/20/18  Yes Geoffery Lyons,  MD    Allergies Patient has no known allergies.   REVIEW OF SYSTEMS  Negative except as noted here or in the History of Present Illness.   PHYSICAL EXAMINATION  Initial Vital Signs Blood pressure 126/83, pulse 99, temperature (!) 97.5 F (36.4 C), temperature source Oral, resp. rate (!) 21, SpO2 98 %.  Examination General: Well-developed, well-nourished male in no acute distress; appearance consistent with age of record HENT: normocephalic; superficial bite mark to right side of tongue Eyes: pupils equal, round and reactive to light; extraocular muscles intact Neck: supple Heart: regular rate and rhythm Lungs: clear to auscultation bilaterally Abdomen: soft; nondistended; nontender; bowel sounds present Extremities: No deformity; full range of motion; pulses normal Neurologic: Awake, but drowsy; motor function intact in all extremities and symmetric; no facial droop Skin: Warm and dry Psychiatric: Flat affect   RESULTS  Summary of this visit's results, reviewed by myself:   EKG Interpretation  Date/Time:    Ventricular Rate:    PR Interval:    QRS Duration:   QT Interval:    QTC Calculation:   R Axis:     Text Interpretation:        Laboratory Studies: Results for orders placed or performed during the hospital encounter of 02/22/18 (from the past 24 hour(s))  CBC with Differential     Status: Abnormal   Collection Time: 02/22/18  6:33 PM  Result Value Ref Range   WBC 6.1  4.0 - 10.5 K/uL   RBC 4.14 (L) 4.22 - 5.81 MIL/uL   Hemoglobin 12.3 (L) 13.0 - 17.0 g/dL   HCT 00.3 (L) 70.4 - 88.8 %   MCV 92.8 80.0 - 100.0 fL   MCH 29.7 26.0 - 34.0 pg   MCHC 32.0 30.0 - 36.0 g/dL   RDW 91.6 94.5 - 03.8 %   Platelets 237 150 - 400 K/uL   nRBC 0.0 0.0 - 0.2 %   Neutrophils Relative % 69 %   Neutro Abs 4.3 1.7 - 7.7 K/uL   Lymphocytes Relative 19 %   Lymphs Abs 1.2 0.7 - 4.0 K/uL   Monocytes Relative 7 %   Monocytes Absolute 0.4 0.1 - 1.0 K/uL   Eosinophils Relative  3 %   Eosinophils Absolute 0.2 0.0 - 0.5 K/uL   Basophils Relative 1 %   Basophils Absolute 0.0 0.0 - 0.1 K/uL   Immature Granulocytes 1 %   Abs Immature Granulocytes 0.03 0.00 - 0.07 K/uL  Basic metabolic panel     Status: Abnormal   Collection Time: 02/22/18  6:33 PM  Result Value Ref Range   Sodium 141 135 - 145 mmol/L   Potassium 3.5 3.5 - 5.1 mmol/L   Chloride 108 98 - 111 mmol/L   CO2 22 22 - 32 mmol/L   Glucose, Bld 108 (H) 70 - 99 mg/dL   BUN 9 6 - 20 mg/dL   Creatinine, Ser 8.82 0.61 - 1.24 mg/dL   Calcium 8.8 (L) 8.9 - 10.3 mg/dL   GFR calc non Af Amer >60 >60 mL/min   GFR calc Af Amer >60 >60 mL/min   Anion gap 11 5 - 15   Imaging Studies: No results found.  ED COURSE and MDM  Nursing notes and initial vitals signs, including pulse oximetry, reviewed.  Vitals:   02/23/18 0100 02/23/18 0135 02/23/18 0200 02/23/18 0300  BP: 120/79 (!) 142/87 137/82 138/88  Pulse: 94 63 83 97  Resp: (!) 22 15 19 17   Temp:      TempSrc:      SpO2: 98% 100% 100% 100%   3:09 AM Patient has had no seizure activity since being given Ativan 2 mg IV.  His significant other plans to contact his neurologist later this morning.  They are comfortable going home at this time.  PROCEDURES    ED DIAGNOSES     ICD-10-CM   1. Breakthrough seizure (HCC) G40.919        Itati Brocksmith, Jonny Ruiz, MD 02/23/18 8003

## 2018-10-01 DIAGNOSIS — G9389 Other specified disorders of brain: Secondary | ICD-10-CM | POA: Insufficient documentation

## 2018-11-16 ENCOUNTER — Emergency Department (HOSPITAL_COMMUNITY): Payer: Managed Care, Other (non HMO)

## 2018-11-16 ENCOUNTER — Encounter (HOSPITAL_COMMUNITY): Payer: Self-pay | Admitting: Internal Medicine

## 2018-11-16 ENCOUNTER — Inpatient Hospital Stay (HOSPITAL_COMMUNITY)
Admission: EM | Admit: 2018-11-16 | Discharge: 2018-11-18 | DRG: 101 | Disposition: A | Discharge status: Home / Self Care | Payer: Managed Care, Other (non HMO) | Attending: Family Medicine | Admitting: Family Medicine

## 2018-11-16 DIAGNOSIS — D72829 Elevated white blood cell count, unspecified: Secondary | ICD-10-CM | POA: Diagnosis present

## 2018-11-16 DIAGNOSIS — F419 Anxiety disorder, unspecified: Secondary | ICD-10-CM | POA: Diagnosis present

## 2018-11-16 DIAGNOSIS — G40801 Other epilepsy, not intractable, with status epilepticus: Principal | ICD-10-CM | POA: Diagnosis present

## 2018-11-16 DIAGNOSIS — E876 Hypokalemia: Secondary | ICD-10-CM | POA: Diagnosis present

## 2018-11-16 DIAGNOSIS — Z79899 Other long term (current) drug therapy: Secondary | ICD-10-CM | POA: Diagnosis not present

## 2018-11-16 DIAGNOSIS — G9389 Other specified disorders of brain: Secondary | ICD-10-CM | POA: Diagnosis present

## 2018-11-16 DIAGNOSIS — F1210 Cannabis abuse, uncomplicated: Secondary | ICD-10-CM | POA: Diagnosis present

## 2018-11-16 DIAGNOSIS — F329 Major depressive disorder, single episode, unspecified: Secondary | ICD-10-CM | POA: Diagnosis present

## 2018-11-16 DIAGNOSIS — G40901 Epilepsy, unspecified, not intractable, with status epilepticus: Secondary | ICD-10-CM | POA: Diagnosis not present

## 2018-11-16 DIAGNOSIS — R0600 Dyspnea, unspecified: Secondary | ICD-10-CM

## 2018-11-16 DIAGNOSIS — R569 Unspecified convulsions: Secondary | ICD-10-CM | POA: Diagnosis present

## 2018-11-16 DIAGNOSIS — Z20828 Contact with and (suspected) exposure to other viral communicable diseases: Secondary | ICD-10-CM | POA: Diagnosis present

## 2018-11-16 DIAGNOSIS — G40011 Localization-related (focal) (partial) idiopathic epilepsy and epileptic syndromes with seizures of localized onset, intractable, with status epilepticus: Secondary | ICD-10-CM | POA: Diagnosis not present

## 2018-11-16 LAB — URINALYSIS, ROUTINE W REFLEX MICROSCOPIC
Bacteria, UA: NONE SEEN
Bilirubin Urine: NEGATIVE
Glucose, UA: 150 mg/dL — AB
Ketones, ur: NEGATIVE mg/dL
Leukocytes,Ua: NEGATIVE
Nitrite: NEGATIVE
Protein, ur: NEGATIVE mg/dL
Specific Gravity, Urine: 1.014 (ref 1.005–1.030)
pH: 6 (ref 5.0–8.0)

## 2018-11-16 LAB — MAGNESIUM: Magnesium: 2 mg/dL (ref 1.7–2.4)

## 2018-11-16 LAB — BASIC METABOLIC PANEL
Anion gap: 11 (ref 5–15)
BUN: 9 mg/dL (ref 6–20)
CO2: 25 mmol/L (ref 22–32)
Calcium: 8.7 mg/dL — ABNORMAL LOW (ref 8.9–10.3)
Chloride: 104 mmol/L (ref 98–111)
Creatinine, Ser: 0.87 mg/dL (ref 0.61–1.24)
GFR calc Af Amer: >60 mL/min (ref >60)
GFR calc non Af Amer: >60 mL/min (ref >60)
Glucose, Bld: 181 mg/dL — ABNORMAL HIGH (ref 70–99)
Potassium: 4.1 mmol/L (ref 3.5–5.1)
Sodium: 140 mmol/L (ref 135–145)

## 2018-11-16 LAB — HEPATIC FUNCTION PANEL
ALT: 31 U/L (ref 0–44)
AST: 28 U/L (ref 15–41)
Albumin: 4.1 g/dL (ref 3.5–5.0)
Alkaline Phosphatase: 95 U/L (ref 38–126)
Bilirubin, Direct: <0.1 mg/dL (ref 0.0–0.2)
Total Bilirubin: 0.6 mg/dL (ref 0.3–1.2)
Total Protein: 7.7 g/dL (ref 6.5–8.1)

## 2018-11-16 LAB — RAPID URINE DRUG SCREEN, HOSP PERFORMED
Amphetamines: NOT DETECTED
Barbiturates: NOT DETECTED
Benzodiazepines: POSITIVE — AB
Cocaine: NOT DETECTED
Opiates: NOT DETECTED
Tetrahydrocannabinol: POSITIVE — AB

## 2018-11-16 LAB — HIV ANTIBODY (ROUTINE TESTING W REFLEX): HIV Screen 4th Generation wRfx: NONREACTIVE

## 2018-11-16 LAB — CBC WITH DIFFERENTIAL/PLATELET
Abs Immature Granulocytes: 0.03 10*3/uL (ref 0.00–0.07)
Basophils Absolute: 0.1 10*3/uL (ref 0.0–0.1)
Basophils Relative: 1 %
Eosinophils Absolute: 0.7 10*3/uL — ABNORMAL HIGH (ref 0.0–0.5)
Eosinophils Relative: 6 %
HCT: 43.9 % (ref 39.0–52.0)
Hemoglobin: 14.2 g/dL (ref 13.0–17.0)
Immature Granulocytes: 0 %
Lymphocytes Relative: 37 %
Lymphs Abs: 4.4 10*3/uL — ABNORMAL HIGH (ref 0.7–4.0)
MCH: 30.7 pg (ref 26.0–34.0)
MCHC: 32.3 g/dL (ref 30.0–36.0)
MCV: 94.8 fL (ref 80.0–100.0)
Monocytes Absolute: 0.9 10*3/uL (ref 0.1–1.0)
Monocytes Relative: 8 %
Neutro Abs: 5.9 10*3/uL (ref 1.7–7.7)
Neutrophils Relative %: 48 %
Platelets: 402 10*3/uL — ABNORMAL HIGH (ref 150–400)
RBC: 4.63 MIL/uL (ref 4.22–5.81)
RDW: 12.8 % (ref 11.5–15.5)
WBC: 12 10*3/uL — ABNORMAL HIGH (ref 4.0–10.5)
nRBC: 0 % (ref 0.0–0.2)

## 2018-11-16 LAB — PHENYTOIN LEVEL, TOTAL
Phenytoin Lvl: 6.6 ug/mL — ABNORMAL LOW (ref 10.0–20.0)
Phenytoin Lvl: 34.4 ug/mL (ref 10.0–20.0)

## 2018-11-16 LAB — CBG MONITORING, ED: Glucose-Capillary: 208 mg/dL — ABNORMAL HIGH (ref 70–99)

## 2018-11-16 LAB — SARS CORONAVIRUS 2 BY RT PCR (HOSPITAL ORDER, PERFORMED IN ~~LOC~~ HOSPITAL LAB): SARS Coronavirus 2: NEGATIVE

## 2018-11-16 MED ORDER — ONDANSETRON HCL 4 MG/2ML IJ SOLN
4.0000 mg | Freq: Four times a day (QID) | INTRAMUSCULAR | Status: DC | PRN
  Administered 2018-11-16: 4 mg via INTRAVENOUS
  Filled 2018-11-16: qty 2

## 2018-11-16 MED ORDER — LORAZEPAM 2 MG/ML IJ SOLN
INTRAMUSCULAR | Status: AC
  Filled 2018-11-16: qty 1

## 2018-11-16 MED ORDER — ENOXAPARIN SODIUM 40 MG/0.4ML ~~LOC~~ SOLN
40.0000 mg | SUBCUTANEOUS | Status: DC
  Administered 2018-11-16 – 2018-11-17 (×2): 40 mg via SUBCUTANEOUS
  Filled 2018-11-16 (×2): qty 0.4

## 2018-11-16 MED ORDER — SODIUM CHLORIDE 0.9% FLUSH
10.0000 mL | Freq: Two times a day (BID) | INTRAVENOUS | Status: DC
  Administered 2018-11-16 – 2018-11-18 (×2): 10 mL

## 2018-11-16 MED ORDER — POLYETHYLENE GLYCOL 3350 17 G PO PACK: 17.0000 g | PACK | Freq: Every day | ORAL | Status: DC | PRN

## 2018-11-16 MED ORDER — SODIUM CHLORIDE 0.9 % IV SOLN
300.0000 mg | INTRAVENOUS | Status: AC
  Administered 2018-11-16: 11:00:00 300 mg via INTRAVENOUS
  Filled 2018-11-16: qty 30

## 2018-11-16 MED ORDER — ACETAMINOPHEN 325 MG PO TABS: 650.0000 mg | ORAL_TABLET | ORAL | Status: DC | PRN

## 2018-11-16 MED ORDER — ACETAMINOPHEN 650 MG RE SUPP: 650.0000 mg | RECTAL | Status: DC | PRN

## 2018-11-16 MED ORDER — SODIUM CHLORIDE 0.9 % IV SOLN
20.0000 mg/kg | INTRAVENOUS | Status: AC
  Administered 2018-11-16: 1270 mg via INTRAVENOUS
  Filled 2018-11-16: qty 25.4

## 2018-11-16 MED ORDER — ONDANSETRON HCL 4 MG PO TABS: 4.0000 mg | ORAL_TABLET | Freq: Four times a day (QID) | ORAL | Status: DC | PRN

## 2018-11-16 MED ORDER — DOCUSATE SODIUM 100 MG PO CAPS
100.0000 mg | ORAL_CAPSULE | Freq: Two times a day (BID) | ORAL | Status: DC
  Filled 2018-11-16 (×4): qty 1

## 2018-11-16 MED ORDER — LORAZEPAM 2 MG/ML IJ SOLN: 2.0000 mg | INTRAMUSCULAR | Status: DC | PRN

## 2018-11-16 MED ORDER — SODIUM CHLORIDE 0.9 % IV SOLN
300.0000 mg | Freq: Two times a day (BID) | INTRAVENOUS | Status: DC
  Administered 2018-11-16: 300 mg via INTRAVENOUS
  Filled 2018-11-16 (×5): qty 30

## 2018-11-16 MED ORDER — SODIUM CHLORIDE 0.9 % IV SOLN
2500.0000 mg | Freq: Two times a day (BID) | INTRAVENOUS | Status: DC
  Administered 2018-11-16 – 2018-11-17 (×3): 2500 mg via INTRAVENOUS
  Filled 2018-11-16 (×5): qty 25

## 2018-11-16 MED ORDER — LORAZEPAM 2 MG/ML IJ SOLN
2.0000 mg | Freq: Once | INTRAMUSCULAR | Status: AC
  Administered 2018-11-16: 2 mg via INTRAVENOUS

## 2018-11-16 MED ORDER — SODIUM CHLORIDE 0.9% FLUSH: 10.0000 mL | INTRAVENOUS | Status: DC | PRN

## 2018-11-16 MED ORDER — SODIUM CHLORIDE 0.9 % IV SOLN
75.0000 mL/h | INTRAVENOUS | Status: DC
  Administered 2018-11-16 – 2018-11-17 (×2): 75 mL/h via INTRAVENOUS

## 2018-11-16 NOTE — ED Notes (Signed)
Pt woke up while phlebotomy in room and tried to stand up. Pt did not speak, but appeared confused. Pt ripped out IV and condom cath. Pt began urinating. Multiple staff came in room and assisted pt with urinal and back into bed.Pt fell back asleep.

## 2018-11-16 NOTE — ED Notes (Signed)
EEG at bedside.

## 2018-11-16 NOTE — ED Provider Notes (Signed)
[facility] EMERGENCY DEPARTMENT Provider Note   CSN: [id number] Arrival date & time:        History   Chief Complaint Chief Complaint  Patient presents with  . Seizures    HPI Zackari Ruane is a 38 y.o. male.  Level 5 caveat secondary to altered mental status.  38 year old male with history of seizure disorder here after a likely seizure at home.  Apparently someone found him in bed seizing or postictal (conflicting information).  [employer] Ambu bag for poor respiratory effort.  EMS found patient breathing on his own.  He had received 2 mg IM Ativan by significant other prior to arrival.  Patient having roving eye movements and no response to any stimuli.     The history is provided by the EMS personnel. The history is limited by the condition of the patient.  Seizures Seizure activity on arrival: no   Seizure type:  Unable to specify Initial focality:  Unable to specify Postictal symptoms: somnolence   Return to baseline: no   Severity:  Unable to specify Duration: unknown. Progression:  Unable to specify Context: change in medication   PTA treatment:  Lorazepam History of seizures: yes     Past Medical History:  Diagnosis Date  . 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 Seizures (HCC)     There are no active problems to display for this patient.   No past surgical history on file.      Home Medications    Prior to Admission medications   Medication Sig Start Date End Date Taking? Authorizing Provider  ibuprofen (ADVIL,MOTRIN) 800 MG tablet Take 800 mg by mouth daily. 01/18/18   [provider]  Lacosamide (VIMPAT) 100 MG TABS Take 1 tablet (100 mg total) by mouth 2 (two) times daily. Patient taking differently: Take 150 mg by mouth 2 (two) times daily.  01/10/18 02/23/18  Long, Arlyss Repress, MD  Oxcarbazepine (TRILEPTAL) 300 MG tablet Take 1.5 tablets (450 mg total) by mouth 2 (two) times daily. 01/20/18   Geoffery Lyons, MD    Family History No family history on file.  Social History Social History   Tobacco Use  . Smoking status: Never Smoker  . Smokeless tobacco: Never Used  Substance Use Topics  . Alcohol use: Yes    Comment: Ocassional and Socially  . Drug use: Yes    Types: Marijuana    Comment: Chronic user     Allergies   Patient has no known allergies.   Review of Systems Review of Systems  Unable to perform ROS: Patient unresponsive  Neurological: Positive for seizures.     Physical Exam Updated Vital Signs BP 121/80 (BP Location: Right Arm)   Pulse 96   Temp 98.5 F (36.9 C) (Oral)   Resp 16   Ht  (1.778 m)   Wt 63.5 kg   SpO2 100%   BMI 20.09 kg/m   Physical Exam Vitals signs and nursing note reviewed.  Constitutional:      Appearance: He is well-developed.  HENT:     Head: Normocephalic and atraumatic.  Eyes:     Conjunctiva/sclera: Conjunctivae normal.  Neck:     Musculoskeletal: Neck supple.  Cardiovascular:     Rate and Rhythm: Normal rate and regular rhythm.     Heart sounds: No murmur.  Pulmonary:     Effort: Pulmonary effort is normal. No respiratory distress.     Breath sounds: Normal breath sounds.  Abdominal:  Palpations: Abdomen is soft.     Tenderness: There is no abdominal tenderness.  Musculoskeletal: Normal range of motion.        General: No deformity.     Right lower leg: No edema.     Left lower leg: No edema.  Skin:    General: Skin is warm and dry.     Capillary Refill: Capillary refill takes less than 2 seconds.  Neurological:     Comments: Patient's eyes are open but is not following any commands.  He does not seem to be alert at all.  He is breathing spontaneously.  He has some horizontal twitching eye movements and they are roving from left center and then right.  He has a increased tone in his upper extremities.      ED Treatments / Results  Labs (all labs ordered are listed, but only abnormal results are displayed)  Labs Reviewed  BASIC METABOLIC PANEL - Abnormal; Notable for the following components:      Result Value   Glucose, Bld 181 (*)    Calcium 8.7 (*)    All other components within normal limits  CBC WITH DIFFERENTIAL/PLATELET - Abnormal; Notable for the following components:   WBC 12.0 (*)    Platelets 402 (*)    Lymphs Abs 4.4 (*)    Eosinophils Absolute 0.7 (*)    All other components within normal limits  PHENYTOIN LEVEL, TOTAL - Abnormal; Notable for the following components:   Phenytoin Lvl 6.6 (*)    All other components within normal limits  URINALYSIS, ROUTINE W REFLEX MICROSCOPIC - Abnormal; Notable for the following components:   Color, Urine STRAW (*)    Glucose, UA 150 (*)    Hgb urine dipstick SMALL (*)    All other components within normal limits  RAPID URINE DRUG SCREEN, HOSP PERFORMED - Abnormal; Notable for the following components:   Benzodiazepines POSITIVE (*)    Tetrahydrocannabinol POSITIVE (*)    All other components within normal limits  PHENYTOIN LEVEL, TOTAL - Abnormal; Notable for the following components:   Phenytoin Lvl 34.4 (*)    All other components within normal limits  CBG MONITORING, ED - Abnormal; Notable for the following components:   Glucose-Capillary 208 (*)    All other components within normal limits  SARS CORONAVIRUS 2 (HOSPITAL ORDER, [place] [facility])  MAGNESIUM  HEPATIC FUNCTION PANEL  HIV ANTIBODY (ROUTINE TESTING W REFLEX)  HIV4GL SAVE TUBE    EKG EKG Interpretation  Date/Time:  Tuesday November 16 2018 09:04:11 EDT Ventricular Rate:  60 PR Interval:    QRS Duration: 111 QT Interval:  419 QTC Calculation: 419 R Axis:   93 Text Interpretation:  Sinus rhythm Borderline short PR interval Right atrial enlargement Borderline right axis deviation Artifact in lead(s) I III aVR aVL aVF V1 V2 V5 V6 poor baseline Confirmed by Aletta Edouard [phone]) on 11/16/2018 9:10:13 AM   Radiology No results found.   Procedures .Critical Care Performed by: Hayden Rasmussen, MD Authorized by: Hayden Rasmussen, MD   Critical care provider statement:    Critical care time (minutes):  45   Critical care time was exclusive of:  Separately billable procedures and treating other patients   Critical care was necessary to treat or prevent imminent or life-threatening deterioration of the following conditions:  CNS failure or compromise   Critical care was time spent personally by me on the following activities:  Discussions with consultants, evaluation of patient's  response to treatment, examination of patient, ordering and performing treatments and interventions, ordering and review of laboratory studies, ordering and review of radiographic studies, pulse oximetry, re-evaluation of patient's condition, obtaining history from patient or surrogate, review of old charts and development of treatment plan with patient or surrogate   I assumed direction of critical care for this patient from another provider in my specialty: no     (including critical care time)  Medications Ordered in ED Medications  LORazepam (ATIVAN) 2 MG/ML injection (has no administration in time range)  levETIRAcetam (KEPPRA) 2,500 mg in sodium chloride 0.9 % 100 mL IVPB (0 mg Intravenous Stopped 11/16/18 1330)  lacosamide (VIMPAT) 300 mg in sodium chloride 0.9 % 25 mL IVPB (has no administration in time range)  enoxaparin (LOVENOX) injection 40 mg (has no administration in time range)  0.9 %  sodium chloride infusion (75 mL/hr Intravenous New Bag/Given 11/16/18 1233)  acetaminophen (TYLENOL) tablet 650 mg (has no administration in time range)    Or  acetaminophen (TYLENOL) suppository 650 mg (has no administration in time range)  docusate sodium (COLACE) capsule 100 mg (100 mg Oral Not Given 11/16/18 1208)  polyethylene glycol (MIRALAX / GLYCOLAX) packet 17 g (has no administration in time range)  ondansetron (ZOFRAN) tablet 4 mg (has no  administration in time range)    Or  ondansetron (ZOFRAN) injection 4 mg (has no administration in time range)  LORazepam (ATIVAN) injection 2 mg (has no administration in time range)  LORazepam (ATIVAN) injection 2 mg (2 mg Intravenous Given 11/16/18 0914)  lacosamide (VIMPAT) 300 mg in sodium chloride 0.9 % 25 mL IVPB (0 mg Intravenous Stopped 11/16/18 1121)  fosPHENYtoin (CEREBYX) 1,270 mg PE in sodium chloride 0.9 % 50 mL IVPB (0 mg PE/kg  63.5 kg Intravenous Stopped 11/16/18 1044)     Initial Impression / Assessment and Plan / ED Course  I have reviewed the triage vital signs and the nursing notes.  Pertinent labs & imaging results that were available during my care of the patient were reviewed by me and considered in my medical decision making (see chart for details).  Clinical Course as of Nov 15 1657  Tue Nov 16, 2018  4098 Discussed with Dr. Wilford Corner from neurology.  He said he would have 1 of the blood colleges come and evaluate the patient.  Patient's wife is here now and said he was okay at 6 this morning and then when they woke up at [facility] of 8 he was very tense and not responsive.  She gave him 2 mg of IM Ativan.  She says he looks like he may be coming out of it although she thinks he also might be seizing.   [MB]  [phone] Patient seen by [facility] from neurology.  She is ordering the patient some Ativan along with IV doses of his Vimpat and Dilantin.  She is working on getting him a EEG.   [MB]  1015 EEG tech is here now putting on the leads for the patient.   [MB]  1033 Discussed with Dr. Melynda Ripple who is recommending consulting critical care as he may need an ICU bed for continued management of his possible status.   [MB]  1120 Discussed with Dr. Molli Knock from the hospitalist service who did not feel he needed ICU level of care.  I have put a bed request into unassigned   [MB]  1145 Discussed with Dr. Ophelia Charter from the hospitalist service who will evaluate the patient for admission.    [  MB]    Clinical Course User Index [MB] Terrilee FilesButler, Steadman Prosperi C, MD        Final Clinical Impressions(s) / ED Diagnoses   Final diagnoses:  Seizure [facility](HCC)    ED Discharge Orders    None       Terrilee FilesButler, Kennetta Pavlovic C, MD 11/16/18 1700

## 2018-11-16 NOTE — H&P (Signed)
History and Physical    Adam [name] SWN:[id number] DOB: 04-09-1980 DOA: 11/16/2018  PCP: [place] Consultants:  Arlyn Leak - neurology Patient coming from:  Home - lives with wife and children; NOK: [phone]  Chief Complaint:  Seizures  HPI: Adam [name] is a 38 y.o. male with medical history significant of seizures and marijuana dependence presenting with seizures.  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  He would not wake up at 745 - sweaty, stiff, not breathing well, EMS directed her to give chest compressions (did a couple), "catatonic every since".  He comes and goes with scratching his face or other apparently purposeful movements but does not follow commands.  He is usually back to normal by then.  He had a scheduled EEG on 8/21 at [facility] and they induced a seizure and he ended up in the ICU.  He went back to work 9/21 and has been fine since until now.     ED Course:  Possible status epilepticus.  Known seizure d/o.  Extensive admission at [facility] 1-2 months ago with status.  Started seizing this AM, stiff and has not returned to baseline.  Given rescue Ativan.  Neurology consulted, concern for status.  Medication adjustments, LTM EEG.  PCCM consulted and declined admission.    Review of Systems: unable to perform  Ambulatory Status:  Ambulates without assistance  Past Medical History:  Diagnosis Date  . 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 Seizures ([place])     Past Surgical History:  Procedure Laterality Date  . NASAL SINUS SURGERY  2002    Social History   Socioeconomic History  . Marital status: Single    Spouse name: Not on file  . Number of children: Not on file  . Years of education: Not on file  . Highest education level: Not on file  Occupational History  . Occupation: Biochemist, clinical  Social Needs  . Financial resource strain: Not on file  . Food insecurity    Worry: Not on file    Inability: Not on file  . Transportation needs    Medical: Not on file    Non-medical: Not on file  Tobacco Use  . Smoking status: Never Smoker  . Smokeless tobacco: Never Used  Substance and Sexual Activity  . Alcohol use: Yes    Comment: Ocassional and Socially  . Drug use: Yes    Types: Marijuana    Comment: daily use  . Sexual activity: Not Currently    Birth control/protection: None  Lifestyle  . Physical activity    Days per week: Not on file    Minutes per session: Not on file  . Stress: Not on file  Relationships  . Social Herbalist on phone: Not on file    Gets together: Not on file    Attends religious service: Not on file    Active member of club or organization: Not on file    Attends meetings of clubs or organizations: Not on file    Relationship status: Not on file  . Intimate partner violence    Fear of current or ex partner: Not on file    Emotionally abused: Not on file    Physically abused: Not on file    Forced sexual activity: Not on file  Other Topics Concern  . Not on file  Social History Narrative  . Not on file    No Known Allergies  Family History  Problem Relation Age of Onset  . Seizures Neg Hx     Prior to Admission medications   Medication Sig Start Date End Date Taking? Authorizing Provider  ibuprofen (ADVIL,MOTRIN) 800 MG tablet Take 800 mg by mouth daily. 01/18/18   [provider]  Lacosamide (VIMPAT) 100 MG TABS Take 1 tablet (100 mg total) by mouth 2 (two) times daily. Patient taking differently: Take 150 mg by mouth 2 (two) times daily.  01/10/18 02/23/18  Maia Plan, MD  levETIRAcetam (KEPPRA) 1000 MG tablet Take 2,500 mg by mouth 2 (two) times daily. 10/27/18   [provider]  Oxcarbazepine (TRILEPTAL) 300 MG tablet Take 1.5 tablets (450 mg total) by mouth 2 (two) times daily. 01/20/18   Geoffery Lyons, MD    Physical Exam: Vitals:   11/16/18 1045 11/16/18 1115 11/16/18 1200 11/16/18 1330   BP: 129/82 131/81 (!) 131/92 132/83  Pulse: 88 89 87 95  Resp: (!) 23 17 (!) 23 18  SpO2: 98% 94% 93% 100%  Weight:      Height:         . General:  Obtunded, unresponsive, snoring loudly; one apparently purposeful movement during evaluation, to apparently scratch his nose . Eyes:  Small but reactive pupils, normal lids, iris . ENT:  grossly normal lips & tongue, mmm; appropriate dentition . Neck:  no LAD, masses or thyromegaly . Cardiovascular:  RRR, no m/r/g. No LE edema.  Marland Kitchen Respiratory:   CTA bilaterally with no wheezes/rales/rhonchi.  Normal respiratory effort.  Snoring. . Abdomen:  soft, NT, ND, NABS . Skin:  no rash or induration seen on limited exam . Musculoskeletal:  grossly normal tone BUE/BLE, no bony abnormality . Lower extremity:  No LE edema.  Limited foot exam with no ulcerations.  2+ distal pulses. Marland Kitchen Psychiatric:  Obtunded, unresponsive due to persistent seizure activity . Neurologic:  Unable to perform    Radiological Exams on Admission: No results found.  EKG: Independently reviewed.  NSR with rate 60; nonspecific ST changes with no evidence of acute ischemia but significant artifact   Labs on Admission: I have personally reviewed the available labs and imaging studies at the time of the admission.  Pertinent labs:   Glucose 181 WBC 12.0 Platelets 402 Dilantin 6.6 COVID negative  Assessment/Plan Principal Problem:   Status epilepticus (HCC) Active Problems:   Marijuana abuse   Status epilepticus -Patient with known h/o seizures presenting with concern for status epilepticus -Seizures initially developed after sinus surgery with craniotomy and resultant encephalomalacia -He was last admitted to [facility] from [facility]25 for seizure evaluation but had his home Keppra dose reduced "in an effort to help capture seizures" and he developed nonconvulsive status epilepticus -He was discharged on Vimpat 200 mg BID, Keppra 2500 mg BID, and Phenytoin 100 mg TID -He  has done well since that d/c, with plan to f/u in October for possible weaning of medications -At consult on 9/16, it appears they began increasing Vimpat and weaning phenytoin while leaving Keppra dose as is -Neurology was consulted upon arrival and was concerned about status -Patient was loaded with fosphenytoin  -STAT EEG showed periodic lateralized epileptiform discharges in the R front region -Patient will need LTM EEG -He was also given Keppra 2500 mg IV and Vimpat 300 mg IV -He also will need driving restriction for at least 6 months -Seizure precautions -Ativan prn -Will admit to Progressive Care Unit, as the patient remains in status and is likely to require  hospitalization -PCCM was consulted by telephone but did not think that the patient requires intubation for airway protection at this time  Marijuana abuse -Cessation should be encouraged on an ongoing basis -UDS ordered   Note: This patient has been tested and is negative for the novel coronavirus COVID-19.  DVT prophylaxis:  Lovenox  Code Status:  Full  Family Communication: Wife was present throughout evaluation  Disposition Plan:  Home once clinically improved Consults called: Neurology  Admission status: Admit - It is my clinical opinion that admission to INPATIENT is reasonable and necessary because of the expectation that this patient will require hospital care that crosses at least 2 midnights to treat this condition based on the medical complexity of the problems presented.  Given the aforementioned information, the predictability of an adverse outcome is felt to be significant.    Jonah BlueJennifer Michalla Ringer MD [facility]   How to contact the [facility] Attending or Consulting provider 7A - 7P or covering provider during after hours 7P -7A, for this patient?  1. Check the care team in [facility] and look for a) attending/consulting [facility] provider listed and b) the [facility] team listed 2. Log into [email or web address] and use [facility]'s universal password to access. If you do not have the password, please contact the hospital operator. 3. Locate the [facility] provider you are looking for under [facility] and page to a number that you can be directly reached. 4. If you still have difficulty reaching the provider, please page the [facility] (Director on Call) for the Hospitalists listed on amion for assistance.   11/16/2018, 1:43 PM

## 2018-11-16 NOTE — Progress Notes (Signed)
Patient arrived to 3W37 A&O x4. Denies pain. Wife at bedside. Pt placed on continuous EEG. Denies pain. States "I'm cold". POC provided to patient and spouse. Nurse will continue to monitor.

## 2018-11-16 NOTE — Consult Note (Addendum)
Neurology Consultation Reason for Consult: Status epilepticus Referring Physician: Dr. Aletta Edouard  CC: Seizure  History is obtained from: Patient's wife at bedside  HPI: Adam [name] is a 38 y.o. male with history of medically intractable epilepsy currently on Dilantin and Vimpat who presented with seizures and altered mental status.  Patient is currently encephalopathic and not responding to questions.  Per patient's wife, he woke up around 7:45 AM this morning and found him in bed, stiff all over, diaphoretic.  She was concerned that he is having a seizure and therefore gave him intramuscular Ativan.  However, he continued to be encephalopathic and therefore she brought him to the ED. while in the ED patient continued to have episodes of right-sided stiffening and required IV Ativan.  Neurology was consulted for further management of status epilepticus.  Patient's wife denies missing any doses of medications, recent illness, stress.  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  About 1-year after that injury he started having seizures which have not been controlled on multiple AEDs. States that patient is seen at [facility] and they are considering VNS implantation.  Patient is being seen by Dr. Arlyn Leak at [facility].  From his last note on 10/27/2018, plan was to increase Vimpat and decrease phenytoin, continue Keppra  Seizure onset: 38 years of age.  Description of seizure: Patient becomes quiet and unresponsive, may be staring , appears diaphoretic, lasts for a few minutes after which he is confused and does not remember the event.  He has also had generalized tonic-clonic seizures. Seizure risk factors: Denies history of febrile seizures, family history of seizures.  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  MRI brain with and without contrast 01/26/2018: No definite epidural epileptogenic structural abnormality identified.  Postsurgical changes of bifrontal and right temporal craniotomy.  Bilateral anterior inferior frontal encephalomalacia in close proximity to the frontal craniotomy could be related to prior surgery of sinus disease.   ROS: A 14 point ROS was unable to obtain due to altered mental status.   Past Medical History:  Diagnosis Date  . 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 Seizures ([place])     No family history on file.:  No family history of seizures per chart review  Social History:  reports that he has never smoked. He has never used smokeless tobacco. He reports current alcohol use. He reports current drug use. Drug: Marijuana.   Exam: Current vital signs: BP (!) 147/72   Pulse (!) 56   Resp 19   Ht 5\' 10"  (1.778 m)   Wt 63.5 kg   SpO2 100%   BMI 20.09 kg/m  Vital signs in last 24 hours: Pulse Rate:  [56-73] 56 (10/06 0930) Resp:  [16-21] 19 (10/06 0930) BP: (147-172)/(72-84) 147/72 (10/06 0930) SpO2:  [91 %-100 %] 100 % (10/06 0930) Weight:  [63.5 kg] 63.5 kg (10/06 0949)   Physical Exam  Constitutional: Appears well-developed and well-nourished.  Psych: Unable to assess secondary to altered mental status Eyes: No scleral injection, initially had right gaze deviation  which resolved after IV Ativan  HENT: No OP obstrucion Head: Normocephalic, nontraumatic Cardiovascular: Normal rate and regular rhythm.  Respiratory: Effort normal, non-labored breathing GI: Soft.  No distension Skin: Warm, no apparent ulcers  Neuro: Mental Status: Winces to noxious  stimuli but does not open eyes or follow  Cranial nerves: PERRLA, initially had right gaze deviation which resolved after IV Ativan, oculocephalic reflex intact, corneal reflex intact, gag reflex intact, no apparent facial asymmetry, rest of the cranial nerves unable to assess secondary to altered mental status Motor, Sensory: Initially had increased tone in right upper extremity which resolved after IV Ativan.  Withdraws to noxious stimuli in all 4 extremities Deep Tendon Reflexes: 3+ in right upper and lower extremity, 2+ in left upper and lower extremity Cerebellar: Unable to assess secondary encephalopathy  I have reviewed labs in epic and the results pertinent to this consultation are: WBC 12 Platelets 402 Glucose 181 Phenytoin, total 6.6  I have reviewed the images obtained: CT head without contrast: Postsurgical changes with encephalomalacia in the anterior frontal lobes.  No acute intracranial abnormality.  Assessment and plan Adam [name] is a 38 year old male with known medically refractory epilepsy who presented in status epilepticus.   Status epilepticus Breakthrough seizures Epilepsy Leukocytosis -Patient was being weaned off of Dilantin which is likely etiology for status epilepticus.   - UA, UDS pending to look for other etiologies.  Leukocytosis is likely reactive secondary to status epilepticus.  Recommendations -On initial assessment, patient had right upper extremity increased tone as well as right gaze deviation concerning for focal seizure.  IV Ativan 2 mg was administered after which it resolved. -Patient was also loaded with IV fosphenytoin 20 mg/kg grams once for status epilepticus.  Ordered 2-hour post-load level. -Ordered stat EEG to assess for subclinical status epilepticus which showed periodic lateralized epileptiform discharges in right frontal region at 1 to 1.5 Hz without clear evolution. -We will continue long-term video EEG monitoring to assess for  subclinical seizures/status epilepticus - Patient has not taken his a.m. dose of antiepileptics.  Therefore will administer IV Keppra 2500 mg once and IV Vimpat 300 mg once -Please continue home AEDs at the following dose    1.  Keppra 2500 mg twice daily  2.  Vimpat 300 mg twice daily -Per review of notes from [facility] health, plan was to wean patient off of Dilantin.  Therefore will avoid starting maintenance dose unless patient's EEG continues to look concerning with PLEDs -We will consider MRI brain to look for any acute abnormality after status epilepticus resolves if needed -Seizure precautions, neurochecks every 4 hours - PRN IV Ativan 2 mg for generalized tonic-clonic seizure lasting more than 2 minutes or focal seizure lasting more than 5 minutes.  Please notify neurology if administered.  CRITICAL CARE Performed by: Charlsie Quest   Total critical care time: 80 minutes  Critical care time was exclusive of separately billable procedures and treating other patients.  Critical care was necessary to treat or prevent imminent or life-threatening deterioration.  Critical care was time spent personally by me on the following activities: development of treatment plan with patient and/or surrogate as well as nursing, discussions with consultants, evaluation of patient's response to treatment, examination of patient, obtaining history from patient or surrogate, ordering and performing treatments and interventions, ordering and review of laboratory studies, ordering and review of radiographic studies, pulse oximetry and re-evaluation of patient's condition.

## 2018-11-16 NOTE — Progress Notes (Signed)
EEG Completed; Results Pending  

## 2018-11-16 NOTE — ED Triage Notes (Signed)
Pt found post ictal and apneic from seizure by wife unknown downtime. Per EMS bagged by fire and started breathing on his own. PT has hx of seizures.  Pupils unequal, shifting gaze. GCS 3-4 Breathing spontaneously on  NRB on arrival

## 2018-11-16 NOTE — ED Notes (Signed)
On arrival patient's pupils are equal and reactive, previously unequal for [facility]. Appeared to seize again but subsided, no orders for medications at this time, per Dr Melina Copa hold off on ativan.

## 2018-11-16 NOTE — Procedures (Signed)
Patient Name: Adam [name]  MRN: [id number]  Epilepsy Attending: Lora Havens  Referring Physician/Provider: Dr. Zeb Comfort Date: 11/16/2018 Duration: 23.58 minutes  Patient history: 38 year old male with history of intractable epilepsy who presented with seizure and altered mental status.  EEG to evaluate for status epilepticus.  Level of alertness: Comatose  AEDs during EEG study: Ativan  Technical aspects: This EEG study was done with scalp electrodes positioned according to the 10-20 International system of electrode placement. Electrical activity was acquired at a sampling rate of 500Hz  and reviewed with a high frequency filter of 70Hz  and a low frequency filter of 1Hz . EEG data were recorded continuously and digitally stored.   Description: EEG showed periodic lateralized epileptiform discharges( PLEDs) in the right frontal region at the rate of 1 to 1.5 Hz.  At times, PLEds appeared more frequent and rhythmic but without clear evolution and are likely brief ictal-interictal discharges.  There was also continuous generalized 2 to 5 Hz theta-delta slowing admixed with 13 to 15 Hz diffuse beta activity.  EEG was reactive to verbal and tactile stimulation. Hyperventilation and photic stimulation were not performed.  Abnormality -Periodic lateralized epileptiform discharges, right frontal -Continuous low, generalized - Excessive beta, diffuse  IMPRESSION: This study showed evidence of epileptogenic city in the right frontal region likely secondary to underlying acute/subacute abnormality.  At times, rhythmic discharges were seen ( BIRDs) without any clear evolution and are likely on the ictal-interictal continuum. The excessive beta activity seen in the background is most likely due to the effect of benzodiazepine and is a benign EEG pattern.  Additionally there was evidence of severe diffuse encephalopathy.

## 2018-11-16 NOTE — ED Notes (Signed)
Condom cath placed on pt 

## 2018-11-16 NOTE — Progress Notes (Signed)
LTM started; educated nurse and wife on event button. No initial skin breakdown was seen.

## 2018-11-17 ENCOUNTER — Inpatient Hospital Stay (HOSPITAL_COMMUNITY): Payer: Managed Care, Other (non HMO)

## 2018-11-17 DIAGNOSIS — G40901 Epilepsy, unspecified, not intractable, with status epilepticus: Secondary | ICD-10-CM

## 2018-11-17 DIAGNOSIS — R569 Unspecified convulsions: Secondary | ICD-10-CM

## 2018-11-17 LAB — COMPREHENSIVE METABOLIC PANEL
ALT: 26 U/L (ref 0–44)
AST: 21 U/L (ref 15–41)
Albumin: 3.6 g/dL (ref 3.5–5.0)
Alkaline Phosphatase: 80 U/L (ref 38–126)
Anion gap: 9 (ref 5–15)
BUN: 6 mg/dL (ref 6–20)
CO2: 25 mmol/L (ref 22–32)
Calcium: 8.7 mg/dL — ABNORMAL LOW (ref 8.9–10.3)
Chloride: 102 mmol/L (ref 98–111)
Creatinine, Ser: 0.91 mg/dL (ref 0.61–1.24)
GFR calc Af Amer: >60 mL/min (ref >60)
GFR calc non Af Amer: >60 mL/min (ref >60)
Glucose, Bld: 124 mg/dL — ABNORMAL HIGH (ref 70–99)
Potassium: 3.4 mmol/L — ABNORMAL LOW (ref 3.5–5.1)
Sodium: 136 mmol/L (ref 135–145)
Total Bilirubin: 0.3 mg/dL (ref 0.3–1.2)
Total Protein: 7.1 g/dL (ref 6.5–8.1)

## 2018-11-17 LAB — CBC
HCT: 37.1 % — ABNORMAL LOW (ref 39.0–52.0)
Hemoglobin: 13.1 g/dL (ref 13.0–17.0)
MCH: 31.4 pg (ref 26.0–34.0)
MCHC: 35.3 g/dL (ref 30.0–36.0)
MCV: 89 fL (ref 80.0–100.0)
Platelets: 297 10*3/uL (ref 150–400)
RBC: 4.17 MIL/uL — ABNORMAL LOW (ref 4.22–5.81)
RDW: 12.3 % (ref 11.5–15.5)
WBC: 16.1 10*3/uL — ABNORMAL HIGH (ref 4.0–10.5)
nRBC: 0 % (ref 0.0–0.2)

## 2018-11-17 MED ORDER — SODIUM CHLORIDE 0.9 % IV SOLN
400.0000 mg | Freq: Two times a day (BID) | INTRAVENOUS | Status: DC
  Filled 2018-11-17 (×2): qty 40

## 2018-11-17 MED ORDER — SODIUM CHLORIDE 0.9 % IV SOLN
300.0000 mg | Freq: Two times a day (BID) | INTRAVENOUS | Status: DC
  Administered 2018-11-17 – 2018-11-18 (×3): 300 mg via INTRAVENOUS
  Filled 2018-11-17 (×4): qty 30

## 2018-11-17 MED ORDER — HYDROXYZINE HCL 25 MG PO TABS
25.0000 mg | ORAL_TABLET | Freq: Four times a day (QID) | ORAL | Status: DC | PRN
  Administered 2018-11-17: 18:00:00 25 mg via ORAL
  Filled 2018-11-17: qty 1

## 2018-11-17 MED ORDER — CLOBAZAM 10 MG PO TABS
10.0000 mg | ORAL_TABLET | Freq: Once | ORAL | Status: AC
  Administered 2018-11-17: 10 mg via ORAL
  Filled 2018-11-17: qty 1

## 2018-11-17 MED ORDER — SODIUM CHLORIDE 0.9 % IV SOLN: 300.0000 mg | Freq: Two times a day (BID) | INTRAVENOUS | Status: DC

## 2018-11-17 NOTE — Progress Notes (Signed)
maint complete- no skin breakdown under fz, c3, cz

## 2018-11-17 NOTE — Progress Notes (Addendum)
PROGRESS NOTE    Adam BreslowRodney [name]  ZOX:[id number] DOB: 11-13-1980 DOA: 11/16/2018 [facility]   Brief Narrative:  38 y/o M who [facility] after his girlfriend was unable to wake him.  He was reportedly sweaty, stiff and not breathing well. Unknown downtime. EMS assisted patient with BVM and transported patient to ER with GCS of 3-4. The patient was given ativan IM by his girlfriend prior to arrival and was instructed by 911 to give compressions.  He carries a history of know seizure disorder and is [facility].  [facility] for planned EEG with induced seizure where he required ICU stay.  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  He is on Vimpat, Keppra and Phenytoin for antiepileptics. Last note in CareEverywhere on 9/16 indicates they increased his Vimpat and were working on weaning his phenytoin.  At baseline, he does not drive. He uses THC 1-2x/day to help with his seizures.   [facility]. Neurology consulted for seizure evaluation.  UDS positive for THC and benzodiazepines. Continuous EEG initiated. EEG reflects ongoing seizure activity as of 10/7.    Assessment & Plan:  Status Epilepticus  Seizure Disorder  -seizures initially developed post sinus surgery for polyp that required craniotomy P: Appreciate Neurology assistance  Continue anti-epileptics, defer titration to Neurology  Note dosing of Onfi x1    Consider burst suppression if unable to achieve control or persistent status  Seizure precautions  Frequent neuro checks  PRN IV Ativan for generalized tonic-clonic seizure Continue monitoring in PCU  [facility] for [facility] liquids for now.  If altered, would make patient NPO Neurology investigating [facility]  [facility]  -note some additives in THC may potentate seizures P: THC cessation  counseling   Anxiety / Depression  -could also be component of frontal lobe involvement  P: Consider initiation of antianxiety / depression agent but would need to review medication(s) for interactions with pharmacy     DVT prophylaxis: lovenox  Code Status: Full Code  Family Communication: Girlfriend and patient updated 10/7 on plan of care.   Medical decision making The below labs and imaging reports reviewed and summarized above.  Medication management as above.  The patient was admitted with status epilepticus.  He has had several seizures in the last 24 hours, but these are brief, and this is within baseline for this patient's severe epilepsy (for which he will undergo sugery soon).  Neurology [facility], and the plan is to continue adjusting antiepileptics, if stable overnight, likely home tomorrow.  Consultants:   Neurolgoy   Procedures:     Antimicrobials:       Subjective: Pt reports feeling "funny" this am but can not explain what that means further.  Denies SOB, fever, n/v. Reports feeling cold at times and then sweating.    Objective: Vitals:   11/16/18 2022 11/16/18 2335 11/17/18 0417 11/17/18 0854  BP: 116/76 (!) 109/58 130/73 127/81  Pulse: 89 90 95 86  Resp: (!) 27 18 16 18   Temp: 99.8 F (37.7 C) 99.5 F (37.5 C) 99.4 F (37.4 C) 99.3 F (37.4 C)  TempSrc: Oral Oral Oral Oral  SpO2: 99% 99% 99% 100%  Weight:      Height:        Intake/Output Summary (Last 24  hours) at 11/17/2018 1112 Last data filed at 11/17/2018 0600 Gross per 24 hour  Intake -  Output 975 ml  Net -975 ml   Filed Weights   11/16/18 0949  Weight: 63.5 kg    Examination: General appearance: young adult male, alert and in no acute distress.   HEENT: Anicteric, conjunctiva pink, lids and lashes normal. No nasal deformity, discharge, epistaxis.  Lips moist, fair dentition.   Skin: Warm and dry.  no jaundice.  No suspicious  rashes or lesions.Multiple tattoos Cardiac: RRR, nl S1-S2, no murmurs appreciated.  Capillary refill is brisk, <3 sec.  JVP normal/not visible.  No LE edema.  Radial pulses 2+ and symmetric. Respiratory: Normal respiratory rate and rhythm.  CTAB without rales or wheezes. Abdomen: Abdomen soft/non-tender to palpation. No ascites, distension, hepatosplenomegaly.   MSK: No deformities or effusions. Neuro: Awake and alert/oriented.  EOMI, moves all extremities. Speech fluent.    Psych: Sensorium intact and responding to questions, attention normal. Affect normal/pleasant.  Judgment and insight appear normal.  Data Reviewed: I have personally reviewed following labs and imaging studies:  CBC: Recent Labs  Lab 11/16/18 0838 11/17/18 0437  WBC 12.0* 16.1*  NEUTROABS 5.9  --   HGB 14.2 13.1  HCT 43.9 37.1*  MCV 94.8 89.0  PLT 402* 297   Basic Metabolic Panel: Recent Labs  Lab 11/16/18 0838 11/16/18 0928 11/17/18 0437  NA 140  --  136  K 4.1  --  3.4*  CL 104  --  102  CO2 25  --  25  GLUCOSE 181*  --  124*  BUN 9  --  6  CREATININE 0.87  --  0.91  CALCIUM 8.7*  --  8.7*  MG  --  2.0  --    GFR: Estimated Creatinine Clearance: 100.8 mL/min (by C-G formula based on SCr of 0.91 mg/dL). Liver Function Tests: Recent Labs  Lab 11/16/18 0928 11/17/18 0437  AST 28 21  ALT 31 26  ALKPHOS 95 80  BILITOT 0.6 0.3  PROT 7.7 7.1  ALBUMIN 4.1 3.6   No results for input(s): LIPASE, AMYLASE in the last 168 hours. No results for input(s): AMMONIA in the last 168 hours. Coagulation Profile: No results for input(s): INR, PROTIME in the last 168 hours. Cardiac Enzymes: No results for input(s): CKTOTAL, CKMB, CKMBINDEX, TROPONINI in the last 168 hours. BNP (last 3 results) No results for input(s): PROBNP in the last 8760 hours. HbA1C: No results for input(s): HGBA1C in the last 72 hours. CBG: Recent Labs  Lab 11/16/18 0908  GLUCAP 208*   Lipid Profile: No results for input(s):  CHOL, HDL, LDLCALC, TRIG, CHOLHDL, LDLDIRECT in the last 72 hours. Thyroid Function Tests: No results for input(s): TSH, T4TOTAL, FREET4, T3FREE, THYROIDAB in the last 72 hours. Anemia Panel: No results for input(s): VITAMINB12, FOLATE, FERRITIN, TIBC, IRON, RETICCTPCT in the last 72 hours. Urine analysis:    Component Value Date/Time   COLORURINE STRAW (A) 11/16/2018 1113   APPEARANCEUR CLEAR 11/16/2018 1113   LABSPEC 1.014 11/16/2018 1113   PHURINE 6.0 11/16/2018 1113   GLUCOSEU 150 (A) 11/16/2018 1113   HGBUR SMALL (A) 11/16/2018 1113   BILIRUBINUR NEGATIVE 11/16/2018 1113   KETONESUR NEGATIVE 11/16/2018 1113   PROTEINUR NEGATIVE 11/16/2018 1113   NITRITE NEGATIVE 11/16/2018 1113   LEUKOCYTESUR NEGATIVE 11/16/2018 1113   Sepsis Labs: @LABRCNTIP (procalcitonin:4,lacticacidven:4)  ) Recent Results (from the past 240 hour(s))  SARS Coronavirus 2 [facility] order, Performed in [facility] hospital lab) Nasopharyngeal  Nasopharyngeal Swab     Status: None   Collection Time: 11/16/18  9:28 AM   Specimen: Nasopharyngeal Swab  Result Value Ref Range Status   SARS Coronavirus 2 NEGATIVE NEGATIVE Final    Comment: (NOTE) If result is NEGATIVE SARS-CoV-2 target nucleic acids are NOT DETECTED. The SARS-CoV-2 RNA is generally detectable in upper and lower  respiratory specimens during the acute phase of infection. The lowest  concentration of SARS-CoV-2 viral copies this assay can detect is 250  copies / mL. A negative result does not preclude SARS-CoV-2 infection  and should not be used as the sole basis for treatment or other  patient management decisions.  A negative result may occur with  improper specimen collection / handling, submission of specimen other  than nasopharyngeal swab, presence of viral mutation(s) within the  areas targeted by this assay, and inadequate number of viral copies  (<250 copies / mL). A negative result must be combined with clinical  observations, patient  history, and epidemiological information. If result is POSITIVE SARS-CoV-2 target nucleic acids are DETECTED. The SARS-CoV-2 RNA is generally detectable in upper and lower  respiratory specimens dur ing the acute phase of infection.  Positive  results are indicative of active infection with SARS-CoV-2.  Clinical  correlation with patient history and other diagnostic information is  necessary to determine patient infection status.  Positive results do  not rule out bacterial infection or co-infection with other viruses. If result is PRESUMPTIVE POSTIVE SARS-CoV-2 nucleic acids MAY BE PRESENT.   A presumptive positive result was obtained on the submitted specimen  and confirmed on repeat testing.  While 2019 novel coronavirus  (SARS-CoV-2) nucleic acids may be present in the submitted sample  additional confirmatory testing may be necessary for epidemiological  and / or clinical management purposes  to differentiate between  SARS-CoV-2 and other Sarbecovirus currently known to infect humans.  If clinically indicated additional testing with an alternate test  methodology [phone]) is advised. The SARS-CoV-2 RNA is generally  detectable in upper and lower respiratory sp ecimens during the acute  phase of infection. The expected result is Negative. Fact Sheet for Patients:  BoilerBrush.com.cy Fact Sheet for Healthcare Providers: https://pope.com/ This test is not yet approved or cleared by the [place] FDA and has been authorized for detection and/or diagnosis of SARS-CoV-2 by FDA under an Emergency Use Authorization (EUA).  This EUA will remain in effect (meaning this test can be used) for the duration of the COVID-19 declaration under Section 564(b)(1) of the Act, 21 U.S.C. section 360bbb-3(b)(1), unless the authorization is terminated or revoked sooner. Performed at [facility], [address] [place], [place] [address]           Radiology Studies: No results found.  Scheduled Meds: . cloBAZam  10 mg Oral Once  . docusate sodium  100 mg Oral BID  . enoxaparin (LOVENOX) injection  40 mg Subcutaneous Q24H  . sodium chloride flush  10-40 mL Intracatheter Q12H   Continuous Infusions: . sodium chloride 75 mL/hr (11/17/18 0543)  . lacosamide (VIMPAT) IV    . levETIRAcetam 2,500 mg (11/17/18 0047)     LOS: 1 day    Time spent: 30 minutes  Canary Brim, AG-ACNP-[facility]   [facility] 11/17/2018, 11:12 AM     Please page through AMION:  [email or web address] Password [email or web address] If 7PM-7AM, please contact night-coverage       Attending MD Note:  I have seen and examined the patient  with nurse practitioner/physician assistant and agree with the note above which has been edited to reflect our agreed upon history, exam, and assessment/plan.   I have personally reviewed the orders for the patient, which were made under my direction.    Adam [name] is a 38 y.o. male with severe epilepsy admitted with prlonged seizure.  His seizures are now improving in duration and frequency.  Neurology have formulated a plan with his outpatient neurologist and we anticipate discharge in the next 24 hours.   [place]

## 2018-11-17 NOTE — Progress Notes (Addendum)
Reason for consult: Seizure  Subjective: Patient has had multiple seizures overnight during which either there are no clinical signs of patient appears to be repositioning in bed/fidgeting.  This morning patient denies having any seizures overnight.  However he also states that his memory is very poor and he may not remember our conversation in 15 minutes.  I spoke with patient's girlfriend (earlier reported to be wife) who states that Adam [name] presented his case at patient management conference yesterday and the team recommended epilepsy surgery.  ROS: negative except above  Examination  Vital signs in last 24 hours: Temp:  [98.5 F (36.9 C)-99.8 F (37.7 C)] 99.3 F (37.4 C) (10/07 0854) Pulse Rate:  [56-100] 86 (10/07 0854) Resp:  [16-27] 18 (10/07 0854) BP: (109-172)/(58-94) 127/81 (10/07 0854) SpO2:  [91 %-100 %] 100 % (10/07 0854) Weight:  [63.5 kg] 63.5 kg (10/06 0949)  General: lying in bed, not in apparent distress CVS: pulse-normal rate and rhythm RS: breathing comfortably Extremities: normal   Neuro: MS: Alert, oriented, follows commands CN: pupils equal and reactive,  EOMI, face symmetric, tongue midline, normal sensation over face, Motor: 5/5 strength in all 4 extremities Reflexes: 2+ bilaterally over patella, biceps, plantars: flexor Coordination: normal Gait: not tested  Basic Metabolic Panel: Recent Labs  Lab 11/16/18 0838 11/16/18 0928 11/17/18 0437  NA 140  --  136  K 4.1  --  3.4*  CL 104  --  102  CO2 25  --  25  GLUCOSE 181*  --  124*  BUN 9  --  6  CREATININE 0.87  --  0.91  CALCIUM 8.7*  --  8.7*  MG  --  2.0  --     CBC: Recent Labs  Lab 11/16/18 0838 11/17/18 0437  WBC 12.0* 16.1*  NEUTROABS 5.9  --   HGB 14.2 13.1  HCT 43.9 37.1*  MCV 94.8 89.0  PLT 402* 297     Coagulation Studies: No results for input(s): LABPROT, INR in the last 72 hours.   IMAGING CT head without contrast: Postsurgical changes with encephalomalacia in  the anterior frontal lobes.  No acute intracranial abnormality.  Assessment and plan Adam [name] is a 38 year old male with known medically refractory epilepsy who presented in status epilepticus.  He has continued to have multiple seizures overnight.   Status epilepticus Breakthrough seizures Medically intractable epilepsy Leukocytosis -Patient was being weaned off of Dilantin which is likely etiology for status epilepticus.   -Patient has had multiple seizures overnight although denies knowledge of any seizures.  Patient's girlfriend reports that patient but states that frequently at night.  I am concerned that patient has been having seizures more frequently than be reported at home.  Therefore challenging to know at this point if this is his baseline seizure frequency or not.  I did review his previous EMU admission report and EEG report and it appears that his right frontal PLEDs did improve after seizure management in the past.  Ideally, I would aggressively treat this patient with burst suppression.  However, due to the concern that some of this might be his baseline and that he will require epilepsy surgery, I have emailed Adam [name], patient's primary epileptologist to guide further management of care which could include burst suppression versus titration of AEDs versus transfer to [facility].    Recommendations -We will give 10 Onfi 10 mg once. -Continue Keppra 2500 mg twice daily and Vimpat 200 mg twice daily -Continue LTM as patient is  having frequent seizures and consider burst suppression if patient goes into status. -We will consider MRI brain to look for any acute abnormality after status epilepticus resolves if needed -Seizure precautions, neurochecks every 4 hours - PRN IV Ativan 2 mg for generalized tonic-clonic seizure lasting more than 2 minutes or focal seizure lasting more than 5 minutes.  Please notify neurology if administered.  Addendum  Patient tolerated 10mg   onfi well without any side effects and also had improvement in eeg. I spoke with patient's primary epileptologist Adam and reviewed his EEG findings. Adam [name] agreed that he has similar PLED plus pattern in past which improved gradually over time without need for burst suppression.   - We will continue treating him with onfi 10mg  BID and rest of his AEDs - Will obtain another EKG to check PR interval ( side effect of vimpat) - Will continue LTM overnight to monitor for seizures - Its likely patient has few seizures at baseline at home. Adam [name] discussed his case at epilepsy patient management conference and plan is to recommend epilepsy surgery - Patient is aware of the possible surgery and has been anxious. I printed out epilepsy surgery information for him and also ordered prn hydroxyzine for anxiety - If overnight patient's remains seizure frequency or has few brief seizures but is back to baseline in am, we will consider discharging with close follow up with Adam and neurosurgery team at [facility].   I have spent a total of   70 minutes with the patient reviewing hospital notes,  test results, labs and examining the patient as well as establishing an assessment and plan that was discussed personally with the patient.  > 50% of time was spent in direct patient care.

## 2018-11-18 DIAGNOSIS — G40011 Localization-related (focal) (partial) idiopathic epilepsy and epileptic syndromes with seizures of localized onset, intractable, with status epilepticus: Secondary | ICD-10-CM

## 2018-11-18 LAB — CBC
HCT: 40.8 % (ref 39.0–52.0)
Hemoglobin: 14 g/dL (ref 13.0–17.0)
MCH: 30.5 pg (ref 26.0–34.0)
MCHC: 34.3 g/dL (ref 30.0–36.0)
MCV: 88.9 fL (ref 80.0–100.0)
Platelets: 298 10*3/uL (ref 150–400)
RBC: 4.59 MIL/uL (ref 4.22–5.81)
RDW: 12.6 % (ref 11.5–15.5)
WBC: 9.9 10*3/uL (ref 4.0–10.5)
nRBC: 0 % (ref 0.0–0.2)

## 2018-11-18 LAB — BASIC METABOLIC PANEL
Anion gap: 11 (ref 5–15)
BUN: 6 mg/dL (ref 6–20)
CO2: 24 mmol/L (ref 22–32)
Calcium: 9.2 mg/dL (ref 8.9–10.3)
Chloride: 103 mmol/L (ref 98–111)
Creatinine, Ser: 0.79 mg/dL (ref 0.61–1.24)
GFR calc Af Amer: >60 mL/min (ref >60)
GFR calc non Af Amer: >60 mL/min (ref >60)
Glucose, Bld: 103 mg/dL — ABNORMAL HIGH (ref 70–99)
Potassium: 3.2 mmol/L — ABNORMAL LOW (ref 3.5–5.1)
Sodium: 138 mmol/L (ref 135–145)

## 2018-11-18 MED ORDER — CLOBAZAM 10 MG PO TABS
10.0000 mg | ORAL_TABLET | Freq: Two times a day (BID) | ORAL | Status: DC
  Administered 2018-11-18: 10:00:00 10 mg via ORAL
  Filled 2018-11-18: qty 1

## 2018-11-18 MED ORDER — LORAZEPAM 2 MG/ML IJ SOLN: 2.0000 mg | Freq: Once | INTRAMUSCULAR | 0 refills | Status: DC | PRN

## 2018-11-18 MED ORDER — CLOBAZAM 10 MG PO TABS: 10.0000 mg | ORAL_TABLET | Freq: Two times a day (BID) | ORAL | 1 refills | Status: DC

## 2018-11-18 MED ORDER — POTASSIUM CHLORIDE CRYS ER 20 MEQ PO TBCR
40.0000 meq | EXTENDED_RELEASE_TABLET | Freq: Once | ORAL | Status: AC
  Administered 2018-11-18: 16:00:00 40 meq via ORAL
  Filled 2018-11-18: qty 2

## 2018-11-18 MED ORDER — VIMPAT 100 MG PO TABS: 300.0000 mg | ORAL_TABLET | Freq: Two times a day (BID) | ORAL | 0 refills | Status: DC

## 2018-11-18 MED ORDER — SODIUM CHLORIDE 0.9 % IV SOLN
2500.0000 mg | Freq: Two times a day (BID) | INTRAVENOUS | Status: DC
  Administered 2018-11-18: 04:00:00 2500 mg via INTRAVENOUS
  Filled 2018-11-18 (×3): qty 25

## 2018-11-18 MED FILL — VIMPAT 200 MG TABLET: 200 | 30 days supply | Qty: 60 | Fill #0

## 2018-11-18 MED FILL — VIMPAT 100 MG TABLET: 100 | 30 days supply | Qty: 60 | Fill #0

## 2018-11-18 MED FILL — cloBAZam 10 MG TABS: 10 | 30 days supply | Qty: 60 | Fill #0

## 2018-11-18 NOTE — Procedures (Signed)
Patient Name:Adam [name] DJM:[id number] Epilepsy Attending:Ceyda Peterka Barbra Sarks Referring Physician/Provider:Dr. Zeb Comfort Duration:11/17/2018 2229 to 11/17/2018 1101  Patient history:38 year old male with history of intractable epilepsy who presented with seizure and altered mental status. EEG to evaluate for status epilepticus.  Level of alertness:awake, asleep  AEDs during EEG study: Keppra, Vimpat, onfi  Technical aspects: This EEG study was done with scalp electrodes positioned according to the 10-20 International system of electrode placement. Electrical activity was acquired at a sampling rate of 500Hz  and reviewed with a high frequency filter of 70Hz  and a low frequency filter of 1Hz . EEG data were recorded continuously and digitally stored.  Description: During awake state, no clear posterior dominant rhythm was seen, During sleep, vertex waves and sleep spindles (12-14hz ) maximal frontocentral were seen.  EEG also showed periodic lateralized epileptiform discharges( PLEDs) with overriding fast activity in the right frontal region at the rate of 1 to 1.5 Hz. At times, PLEDsappeared more frequent and rhythmic without clear evolution. These are likely brief ictal-interictal discharges. There were also multiple brief seizures, lasting 12-15 seconds, recorded from right frontal region during which patient didn't have any clinical signs and was noted to continue his activity ( looking at phone) or sleeping. Concomitant EEG showed spike in right frontal region followed by rhythmic delta activity which evolves into theta activity and spreads to the. right hemisphere There was also continuous generalized 2 to 5 Hz theta-delta slowing, maximal in right frontotemporal region.Hyperventilation and photic stimulation were not performed.  Abnormality - Seizure, right frontal  - Periodiclateralized epileptiform discharges with overriding fast ( PLEDS plus), right frontal -  Brief ictal-intericral discharges, right  -Continuous slow, generalized maximal right frontotemporal  IMPRESSION: This studyshowed multiple brief subclinical seizures arising from right frontal seizure, lasting 12-15 seconds. There was also evidence of moderate diffuse encephalopathy.         [name] Barbra Sarks

## 2018-11-18 NOTE — Progress Notes (Signed)
Reason for consult: Seizure  Subjective: No clinical seizure-like episodes overnight.  Patient states he is feeling back to baseline.  His girlfriend was on phone and also states that he is very close to his baseline, is able to remember things from the past few days now and able to hold a full conversation.   ROS: negative except above  Examination  Vital signs in last 24 hours: Temp:  [98.5 F (36.9 C)-99.5 F (37.5 C)] 98.6 F (37 C) (10/08 0749) Pulse Rate:  [69-95] 81 (10/08 0749) Resp:  [15-18] 16 (10/08 0749) BP: (129-140)/(82-94) 131/88 (10/08 0749) SpO2:  [98 %-100 %] 98 % (10/08 0749)  General: lying in bed, not in apparent distress CVS: pulse-normal rate and rhythm RS: breathing comfortably Extremities: normal   Neuro: MS: Alert, oriented, follows commands CN: pupils equal and reactive,  EOMI, face symmetric, tongue midline, normal sensation over face Motor: 5/5 strength in all 4 extremities Reflexes: 2+ bilaterally over patella, biceps, plantars: flexor Coordination: normal Gait: not tested  Basic Metabolic Panel: Recent Labs  Lab 11/16/18 0838 11/16/18 0928 11/17/18 0437 11/18/18 0303  NA 140  --  136 138  K 4.1  --  3.4* 3.2*  CL 104  --  102 103  CO2 25  --  25 24  GLUCOSE 181*  --  124* 103*  BUN 9  --  6 6  CREATININE 0.87  --  0.91 0.79  CALCIUM 8.7*  --  8.7* 9.2  MG  --  2.0  --   --     CBC: Recent Labs  Lab 11/16/18 0838 11/17/18 0437 11/18/18 0303  WBC 12.0* 16.1* 9.9  NEUTROABS 5.9  --   --   HGB 14.2 13.1 14.0  HCT 43.9 37.1* 40.8  MCV 94.8 89.0 88.9  PLT 402* 297 298     Coagulation Studies: No results for input(s): LABPROT, INR in the last 72 hours.  IMAGING CT head without contrast: Postsurgical changes with encephalomalacia in the anterior frontal lobes. No acute intracranial abnormality.  Assessment and plan Mr. Xiang is a 38 year old male with known medically refractory epilepsy who presented in status  epilepticus.  He has continued to have multiple seizures overnight.   Status epilepticus (resolved) Breakthrough seizures Medically intractable epilepsy Leukocytosis -Patient was being weaned off of Dilantin which is likely etiology for status epilepticus. -Patient had some brief subclinical seizures lasting no more than 12 to 15 seconds overnight.  It is possible that he has some subclinical seizures at baseline and/or his seizure frequency has increased due to recent discontinuation of Dilantin -I also discussed his care with his primary epileptologist Dr Baldo Daub who stated that  during previous admission for status epilepticus patient had right sided PLEDs which did not completely disappear at the time of discharge but had resolved when repeat EEG was done during clinic follow-up   Recommendations -Discontinue LTM as patient is back to baseline -Long-term Dilantin use has multiple adverse effects and therefore ideally we would like to keep him off of Dilantin.  He tolerated Onfi 10 mg well when when administered yesterday.  Therefore we will continue Onfi 10 mg twice daily.  - Continue home AEDs Keppra 2500 mg twice daily and Vimpat 300 mg twice daily  -  I counseled the patient and his girlfriend who is available on phone regarding possible side effects including sedation which can happen from days to week after initiation of Onfi.  - I also counseled them regarding management of status  epilepticus including administering Ativan as they have been instructed in the past by their primary epileptologist -We will provide prescription for one-time dose of Ativan 2 mg to be administered IM for generalized tonic-clonic seizure lasting more than 3 minutes/as instructed by primary epileptologisy -Seizure precautions including do not drive until cleared by physician -Follow-up with the epileptologist Dr. Alma Friendly as scheduled.    I have spent a total of   35 minutes with the patient  reviewing hospital notes,  test results, labs and examining the patient as well as establishing an assessment and plan that was discussed personally with the patient.  > 50% of time was spent in direct patient care.

## 2018-11-18 NOTE — Progress Notes (Signed)
Patient discharged home with significant other. Discharge instructions, work note, and prescription given. IV and telemetry discontinued. Patient transferred from unit via wheelchair with unit staff. Wendee Copp

## 2018-11-18 NOTE — Progress Notes (Signed)
LTM discontinued. No skin breakdown was seen. 

## 2018-11-18 NOTE — Discharge Summary (Signed)
Physician Discharge Summary  Adam [name] FIE:[id number] DOB: 08-25-1980 DOA: 11/16/2018  PCP: [place] date: 11/16/2018 Discharge date: 11/18/2018  Admitted From: Home  Disposition:  Home   Recommendations for Outpatient Follow-up:  1. Follow up with primary neurologist as soon as possible     Home Health: None  Equipment/Devices: None  Discharge Condition: Good  CODE STATUS: FULL Diet recommendation: Regular  Brief/Interim Summary: Adam [name] is a 38 y.o. M with severe epilepsy who presented with intractable seizure at home.    Found by girlfriend sweaty, stiff and not breathing well. Unknown downtime. The patient was given IM Ativan by his girlfriend without complete resolution of seizure.    In the ER, patient continued to have intermittent seizure-like activity. Case was discussed with Neurology and Critical Care, who felt the patient was near baseline, did not need elective intubation for burst suppression.     PRINCIPAL HOSPITAL DIAGNOSIS:  Status epilepticus    Discharge Diagnoses:    Status Epilepticus  Seizure Disorder  UA and CXR normal.  No fever.  Doubt infection.  Electrolytes and renal function normal.  He was admitted and AEDs were titrated and he was started on LTM.  He had PLEDs in right frontal region overnight, which improved after 24 hours.  The case was discussed by Dr. Hortense Ramal with the patient's primary epileptologist at [facility], who concurred with the plan of care to avoid burst suppression, add Onfi.    After 24 hours, his clinical appearance was considerably better.  He appeared back to baseline, and in discussion with his primary Neurologist, was discharged on new Onfi.  SJS precautions, drowsiness side effects discussed.   -Continue Keppra 2500 BID -Continue Vimpat 300 BID -Continue Onfi 10 BID -Close follow up with Neurology at [facility]  UDS positive for THC.  Cessation counseling recommended     Hypokalemia  Supplemented.  Anxiety / Depression  Patient has tele-counselor.  Not currently on SSRI.             Discharge Instructions  Discharge Instructions    Discharge instructions   Complete by: As directed    From [facility] and Dr. Loleta Books: You were admitted for a long seizure. While you were here, we started a new medicine, in collaboration with Dr. Arlyn Leak called clobazam/Onfi. Take Onfi 10 mg twice daily If you have fever, mouth sores, lips peeling, or a new rash STOP ONFI IMMEDIATELY, call Dr. Arlyn Leak office and go straight to the [facility] ER.  We have provided a prescription for intramuscular Ativan injection, to be used to abort a seizure. Dr. Arlyn Leak can refill this.  We have provided refills of your Vimpat at the 300 mg twice daily dose.  Have Dr. Arlyn Leak refill this.  Stop the Dilantin. Continue your Keppra Follow up with Dr. Arlyn Leak as soon as he is able to see you  (his follow up information is in the "To Do" section below)   Increase activity slowly   Complete by: As directed      Allergies as of 11/18/2018   No Known Allergies     Medication List    STOP taking these medications   Phenytoin Infatabs 50 MG tablet Generic drug: phenytoin     TAKE these medications   cloBAZam 10 MG tablet Commonly known as: ONFI Take 1 tablet (10 mg total) by mouth 2 (two) times daily.   levETIRAcetam 1000 MG tablet Commonly known as: KEPPRA Take 2,500 mg by  mouth 2 (two) times daily.   LORazepam 2 MG/ML injection Commonly known as: ATIVAN Inject 1 mL (2 mg total) into the muscle once as needed for up to 1 dose for seizure.   Vimpat 100 MG Tabs Generic drug: Lacosamide Take 3 tablets (300 mg total) by mouth 2 (two) times daily.      Follow-up Information    De Hollingshead, MD Follow up.   Specialty: Neurology Why: Office should call you for appointment.  If you do not hear from them by 10/12, please call to schedule an appointment.   Contact information: [address] [place] [address] [phone]          No Known Allergies  Consultations:  Neurology   Procedures/Studies: Dg Chest Port 1 View  Result Date: 11/17/2018 CLINICAL DATA:  Shortness of breath. EXAM: PORTABLE CHEST 1 VIEW COMPARISON:  None. FINDINGS: The heart size and mediastinal contours are within normal limits. Both lungs are clear. The visualized skeletal structures are unremarkable. IMPRESSION: No active disease. Electronically Signed   By: Obie Dredge M.D.   On: 11/17/2018 18:42       Subjective: Feels better.  No seizure clinically overnight.  No fever.    Discharge Exam: Vitals:   11/18/18 0749 11/18/18 1208  BP: 131/88 (!) 152/84  Pulse: 81 76  Resp: 16 14  Temp: 98.6 F (37 C) 98.1 F (36.7 C)  SpO2: 98% 100%   Vitals:   11/17/18 2335 11/18/18 0416 11/18/18 0749 11/18/18 1208  BP: (!) 133/94 136/85 131/88 (!) 152/84  Pulse: 87 69 81 76  Resp: 17 15 16 14   Temp: 99.4 F (37.4 C) 98.6 F (37 C) 98.6 F (37 C) 98.1 F (36.7 C)  TempSrc: Oral Oral Oral Oral  SpO2:  98% 98% 100%  Weight:      Height:        General: Pt is alert, awake, not in acute distress Cardiovascular: RRR, nl S1-S2, no murmurs appreciated.   No LE edema.   Respiratory: Normal respiratory rate and rhythm.  CTAB without rales or wheezes. Abdominal: Abdomen soft and non-tender.  No distension or HSM.   Neuro/Psych: Strength symmetric in upper and lower extremities.  Judgment and insight appear normal.   The results of significant diagnostics from this hospitalization (including imaging, microbiology, ancillary and laboratory) are listed below for reference.     Microbiology: Recent Results (from the past 240 hour(s))  SARS Coronavirus 2 [facility] order, Performed in [facility] hospital lab) Nasopharyngeal Nasopharyngeal Swab     Status: None   Collection Time: 11/16/18  9:28 AM   Specimen: Nasopharyngeal Swab  Result Value  Ref Range Status   SARS Coronavirus 2 NEGATIVE NEGATIVE Final    Comment: (NOTE) If result is NEGATIVE SARS-CoV-2 target nucleic acids are NOT DETECTED. The SARS-CoV-2 RNA is generally detectable in upper and lower  respiratory specimens during the acute phase of infection. The lowest  concentration of SARS-CoV-2 viral copies this assay can detect is 250  copies / mL. A negative result does not preclude SARS-CoV-2 infection  and should not be used as the sole basis for treatment or other  patient management decisions.  A negative result may occur with  improper specimen collection / handling, submission of specimen other  than nasopharyngeal swab, presence of viral mutation(s) within the  areas targeted by this assay, and inadequate number of viral copies  (<250 copies / mL). A negative result must be combined with clinical  observations, patient history, and epidemiological information. If result is POSITIVE SARS-CoV-2 target nucleic acids are DETECTED. The SARS-CoV-2 RNA is generally detectable in upper and lower  respiratory specimens dur ing the acute phase of infection.  Positive  results are indicative of active infection with SARS-CoV-2.  Clinical  correlation with patient history and other diagnostic information is  necessary to determine patient infection status.  Positive results do  not rule out bacterial infection or co-infection with other viruses. If result is PRESUMPTIVE POSTIVE SARS-CoV-2 nucleic acids MAY BE PRESENT.   A presumptive positive result was obtained on the submitted specimen  and confirmed on repeat testing.  While 2019 novel coronavirus  (SARS-CoV-2) nucleic acids may be present in the submitted sample  additional confirmatory testing may be necessary for epidemiological  and / or clinical management purposes  to differentiate between  SARS-CoV-2 and other Sarbecovirus currently known to infect humans.  If clinically indicated additional testing with an  alternate test  methodology [phone]) is advised. The SARS-CoV-2 RNA is generally  detectable in upper and lower respiratory sp ecimens during the acute  phase of infection. The expected result is Negative. Fact Sheet for Patients:  BoilerBrush.com.cy Fact Sheet for Healthcare Providers: https://pope.com/ This test is not yet approved or cleared by the [place] FDA and has been authorized for detection and/or diagnosis of SARS-CoV-2 by FDA under an Emergency Use Authorization (EUA).  This EUA will remain in effect (meaning this test can be used) for the duration of the COVID-19 declaration under Section 564(b)(1) of the Act, 21 U.S.C. section 360bbb-3(b)(1), unless the authorization is terminated or revoked sooner. Performed at [facility], [address] [place], [place] [address]      Labs: BNP (last 3 results) No results for input(s): BNP in the last 8760 hours. Basic Metabolic Panel: Recent Labs  Lab 11/16/18 0838 11/16/18 0928 11/17/18 0437 11/18/18 0303  NA 140  --  136 138  K 4.1  --  3.4* 3.2*  CL 104  --  102 103  CO2 25  --  25 24  GLUCOSE 181*  --  124* 103*  BUN 9  --  6 6  CREATININE 0.87  --  0.91 0.79  CALCIUM 8.7*  --  8.7* 9.2  MG  --  2.0  --   --    Liver Function Tests: Recent Labs  Lab 11/16/18 0928 11/17/18 0437  AST 28 21  ALT 31 26  ALKPHOS 95 80  BILITOT 0.6 0.3  PROT 7.7 7.1  ALBUMIN 4.1 3.6   No results for input(s): LIPASE, AMYLASE in the last 168 hours. No results for input(s): AMMONIA in the last 168 hours. CBC: Recent Labs  Lab 11/16/18 0838 11/17/18 0437 11/18/18 0303  WBC 12.0* 16.1* 9.9  NEUTROABS 5.9  --   --   HGB 14.2 13.1 14.0  HCT 43.9 37.1* 40.8  MCV 94.8 89.0 88.9  PLT 402* 297 298   Cardiac Enzymes: No results for input(s): CKTOTAL, CKMB, CKMBINDEX, TROPONINI in the last 168 hours. BNP: Invalid input(s): POCBNP CBG: Recent Labs  Lab  11/16/18 0908  GLUCAP 208*   D-Dimer No results for input(s): DDIMER in the last 72 hours. Hgb A1c No results for input(s): HGBA1C in the last 72 hours. Lipid Profile No results for input(s): CHOL, HDL, LDLCALC, TRIG, CHOLHDL, LDLDIRECT in the last 72 hours. Thyroid function studies No results for input(s): TSH, T4TOTAL, T3FREE, THYROIDAB in the last 72 hours.  Invalid  input(s): FREET3 Anemia work up No results for input(s): VITAMINB12, FOLATE, FERRITIN, TIBC, IRON, RETICCTPCT in the last 72 hours. Urinalysis    Component Value Date/Time   COLORURINE STRAW (A) 11/16/2018 1113   APPEARANCEUR CLEAR 11/16/2018 1113   LABSPEC 1.014 11/16/2018 1113   PHURINE 6.0 11/16/2018 1113   GLUCOSEU 150 (A) 11/16/2018 1113   HGBUR SMALL (A) 11/16/2018 1113   BILIRUBINUR NEGATIVE 11/16/2018 1113   KETONESUR NEGATIVE 11/16/2018 1113   PROTEINUR NEGATIVE 11/16/2018 1113   NITRITE NEGATIVE 11/16/2018 1113   LEUKOCYTESUR NEGATIVE 11/16/2018 1113   Sepsis Labs Invalid input(s): PROCALCITONIN,  WBC,  LACTICIDVEN Microbiology Recent Results (from the past 240 hour(s))  SARS Coronavirus 2 [facility](Hospital order, Performed [facility]) Nasopharyngeal Nasopharyngeal Swab     Status: None   Collection Time: 11/16/18  9:28 AM   Specimen: Nasopharyngeal Swab  Result Value Ref Range Status   SARS Coronavirus 2 NEGATIVE NEGATIVE Final    Comment: (NOTE) If result is NEGATIVE SARS-CoV-2 target nucleic acids are NOT DETECTED. The SARS-CoV-2 RNA is generally detectable in upper and lower  respiratory specimens during the acute phase of infection. The lowest  concentration of SARS-CoV-2 viral copies this assay can detect is 250  copies / mL. A negative result does not preclude SARS-CoV-2 infection  and should not be used as the sole basis for treatment or other  patient management decisions.  A negative result may occur with  improper specimen collection / handling, submission of specimen other   than nasopharyngeal swab, presence of viral mutation(s) within the  areas targeted by this assay, and inadequate number of viral copies  (<250 copies / mL). A negative result must be combined with clinical  observations, patient history, and epidemiological information. If result is POSITIVE SARS-CoV-2 target nucleic acids are DETECTED. The SARS-CoV-2 RNA is generally detectable in upper and lower  respiratory specimens dur ing the acute phase of infection.  Positive  results are indicative of active infection with SARS-CoV-2.  Clinical  correlation with patient history and other diagnostic information is  necessary to determine patient infection status.  Positive results do  not rule out bacterial infection or co-infection with other viruses. If result is PRESUMPTIVE POSTIVE SARS-CoV-2 nucleic acids MAY BE PRESENT.   A presumptive positive result was obtained on the submitted specimen  and confirmed on repeat testing.  While 2019 novel coronavirus  (SARS-CoV-2) nucleic acids may be present in the submitted sample  additional confirmatory testing may be necessary for epidemiological  and / or clinical management purposes  to differentiate between  SARS-CoV-2 and other Sarbecovirus currently known to infect humans.  If clinically indicated additional testing with an alternate test  methodology [phone]([id number]) is advised. The SARS-CoV-2 RNA is generally  detectable in upper and lower respiratory sp ecimens during the acute  phase of infection. The expected result is Negative. Fact Sheet for Patients:  BoilerBrush.com.cyhttps://www.fda.gov/media/136312/download Fact Sheet for Healthcare Providers: https://pope.com/https://www.fda.gov/media/136313/download This test is not yet approved or cleared by the [place] FDA and has been authorized for detection and/or diagnosis of SARS-CoV-2 by FDA under an Emergency Use Authorization (EUA).  This EUA will remain in effect (meaning this test can be used) for the duration of  the COVID-19 declaration under Section 564(b)(1) of the Act, 21 U.S.C. section 360bbb-3(b)(1), unless the authorization is terminated or revoked sooner. Performed at [facility], [address] [place], [place] [address]      Time coordinating discharge: 35 minutes The [place]  controlled substances registry was reviewed for this patient prior to filling the <5 days supply controlled substances script.      SIGNED:   Alberteen Sam, MD  [facility] 11/18/2018, 2:06 PM

## 2018-11-18 NOTE — Procedures (Signed)
Patient Name: Adam [name]  MRN: [id number]  Epilepsy Attending: Lora Havens  Referring Physician/Provider: Dr. Zeb Comfort Duration: 11/16/2018 1125 to 11/17/2018 1125  Patient history: 38 year old male with history of intractable epilepsy who presented with seizure and altered mental status.  EEG to evaluate for status epilepticus.  Level of alertness: awake, asleep  AEDs during EEG study: Ativan, keppra, PHT, Vimpat, onfi  Technical aspects: This EEG study was done with scalp electrodes positioned according to the 10-20 International system of electrode placement. Electrical activity was acquired at a sampling rate of 500Hz  and reviewed with a high frequency filter of 70Hz  and a low frequency filter of 1Hz . EEG data were recorded continuously and digitally stored.   Description: During awake state, no clear posterior dominant rhythm was seen, During sleep, vertex waves and sleep spindles (12-14hz ) maximal frontocentral were seen.  EEG also showed periodic lateralized epileptiform discharges( PLEDs) with overriding fast activity  in the right frontal region at the rate of 1 to 1.5 Hz.  At times, PLEDs appeared more frequent and rhythmic without clear evolution. These are likely brief ictal-interictal discharges. There were also multiple brief seizures, lasting 12-15 seconds, recorded from right frontal region during which patient didn't have any clinical signs and was noted to continue his activity ( looking at phone) or sleeping. Concomitant EEG showed spike in right frontal region followed by rhythmic delta activity which evolves into theta activity and spreads to the. right hemisphere There was also continuous generalized 2 to 5 Hz theta-delta slowing admixed with 13 to 15 Hz diffuse beta activity.   Hyperventilation and photic stimulation were not performed.  Abnormality - Seizure, right frontal  -Periodic lateralized epileptiform discharges with overriding fast ( PLEDS plus),  right frontal - Brief ictal-intericral discharges, right  -Continuous slow, generalized - Excessive beta, diffuse  IMPRESSION: This study showed multiple brief subclinical seizures arising from right frontal seizure, lasting 12-15 seconds.  There was also evidence of moderate diffuse encephalopathy. The excessive beta activity seen in the background is most likely due to the effect of benzodiazepine and is a benign EEG pattern.

## 2018-11-18 NOTE — Progress Notes (Addendum)
PROGRESS NOTE    Wendelyn BreslowRodney Nichter  UEA:[id number]:[id number] DOB: [birth year] DOA: 11/16/2018 [facility]   Brief Narrative:  38 y/o M who [facility] after his girlfriend was unable to wake him.  He was reportedly sweaty, stiff and not breathing well. Unknown downtime. EMS assisted patient with BVM and transported patient to ER with GCS of 3-4. The patient was given ativan IM by his girlfriend prior to arrival and was instructed by 911 to give compressions.  He carries a history of know seizure disorder and is [facility].  [facility] for planned EEG with induced seizure where he required ICU stay.  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  He is on Vimpat, Keppra and Phenytoin for antiepileptics. Last note in CareEverywhere on 9/16 indicates they increased his Vimpat and were working on weaning his phenytoin.  At baseline, he does not drive. He uses THC 1-2x/day to help with his seizures.   [facility] Neurology consulted for seizure evaluation.  UDS positive for THC and benzodiazepines. Continuous EEG initiated. EEG reflects ongoing seizure activity as of 10/7.  He was treated with Onfi with improvement in seizures.    Assessment & Plan:  Status Epilepticus  Seizure Disorder  -seizures initially developed post sinus surgery for polyp that required craniotomy P: Neurology following, appreciate input  Continue anti-epileptics, defer to Neurology  Will plan for discharge if cleared by Neurology  [facility]  Seizure precautions  Continuous EEG  Advance diet   THC Abuse  -note some additives in THC may potentate seizures P: THC counseling cessation. Reviewed risk of potentiation of seizure activity   Hypokalemia  P: 40 mEq KCL x1  Follow up in am, suspect related to decreased intake with  NPO status   Anxiety / Depression  -could also be component of frontal lobe involvement  P: Consider initiation of depression agent but would need to review interactions with pharmacy.  Will defer to outpatient neurology.  Episode of dyspnea with what appeared to be anxiety on 10/7, CXR personally reviewed and without infiltrate or edema  DVT prophylaxis: lovenox  Code Status: Full Code  Family Communication: Girlfriend and patient updated 10/7 on plan of care.   Medical decision making   Consultants:   Neurolgoy   Procedures:   Continuous EEG   Antimicrobials:       Subjective: Pt reports he slept well overnight. Denies seizure activity.  Ate breakfast.    Objective: Vitals:   11/17/18 1919 11/17/18 2335 11/18/18 0416 11/18/18 0749  BP: (!) 140/94 (!) 133/94 136/85 131/88  Pulse: 73 87 69 81  Resp: 17 17 15 16   Temp: 99 F (37.2 C) 99.4 F (37.4 C) 98.6 F (37 C) 98.6 F (37 C)  TempSrc: Oral Oral Oral Oral  SpO2: 100%  98% 98%  Weight:      Height:        Intake/Output Summary (Last 24 hours) at 11/18/2018 0813 Last data filed at 11/18/2018 0310 Gross per 24 hour  Intake 2842.92 ml  Output 1350 ml  Net 1492.92 ml   Filed Weights   11/16/18 0949  Weight: 63.5 kg    Examination: General appearance: young adult male lying in bed in NAD HEENT: MM pink/moist, no JVD, fair dentition, missing upper tooth on R  Skin: Warm/dry, no rashes or lesions. Multiple tattoos.  Cardiac: RRR, no M/R/G, symmetrical pulses  Respiratory: even/non-labored on room air, lungs bilaterally clear without wheeze Abdomen: soft/non-tender, bsx4 active  MSK: no obvious deformitie  Neuro: AAOx4, speech clear, MAE, normal strength     Psych: calm, occasional tearing up / emotional when talking about plan of care.    Data Reviewed: I have personally reviewed following labs and imaging studies:  CBC: Recent Labs  Lab 11/16/18 0838 11/17/18 0437 11/18/18 0303  WBC 12.0*  16.1* 9.9  NEUTROABS 5.9  --   --   HGB 14.2 13.1 14.0  HCT 43.9 37.1* 40.8  MCV 94.8 89.0 88.9  PLT 402* 297 947   Basic Metabolic Panel: Recent Labs  Lab 11/16/18 0838 11/16/18 0928 11/17/18 0437 11/18/18 0303  NA 140  --  136 138  K 4.1  --  3.4* 3.2*  CL 104  --  102 103  CO2 25  --  25 24  GLUCOSE 181*  --  124* 103*  BUN 9  --  6 6  CREATININE 0.87  --  0.91 0.79  CALCIUM 8.7*  --  8.7* 9.2  MG  --  2.0  --   --    GFR: Estimated Creatinine Clearance: 114.7 mL/min (by C-G formula based on SCr of 0.79 mg/dL). Liver Function Tests: Recent Labs  Lab 11/16/18 0928 11/17/18 0437  AST 28 21  ALT 31 26  ALKPHOS 95 80  BILITOT 0.6 0.3  PROT 7.7 7.1  ALBUMIN 4.1 3.6   No results for input(s): LIPASE, AMYLASE in the last 168 hours. No results for input(s): AMMONIA in the last 168 hours. Coagulation Profile: No results for input(s): INR, PROTIME in the last 168 hours. Cardiac Enzymes: No results for input(s): CKTOTAL, CKMB, CKMBINDEX, TROPONINI in the last 168 hours. BNP (last 3 results) No results for input(s): PROBNP in the last 8760 hours. HbA1C: No results for input(s): HGBA1C in the last 72 hours. CBG: Recent Labs  Lab 11/16/18 0908  GLUCAP 208*   Lipid Profile: No results for input(s): CHOL, HDL, LDLCALC, TRIG, CHOLHDL, LDLDIRECT in the last 72 hours. Thyroid Function Tests: No results for input(s): TSH, T4TOTAL, FREET4, T3FREE, THYROIDAB in the last 72 hours. Anemia Panel: No results for input(s): VITAMINB12, FOLATE, FERRITIN, TIBC, IRON, RETICCTPCT in the last 72 hours. Urine analysis:    Component Value Date/Time   COLORURINE STRAW (A) 11/16/2018 1113   APPEARANCEUR CLEAR 11/16/2018 1113   LABSPEC 1.014 11/16/2018 1113   PHURINE 6.0 11/16/2018 1113   GLUCOSEU 150 (A) 11/16/2018 1113   HGBUR SMALL (A) 11/16/2018 1113   BILIRUBINUR NEGATIVE 11/16/2018 1113   KETONESUR NEGATIVE 11/16/2018 1113   PROTEINUR NEGATIVE 11/16/2018 1113   NITRITE  NEGATIVE 11/16/2018 1113   LEUKOCYTESUR NEGATIVE 11/16/2018 1113   Sepsis Labs: @LABRCNTIP (procalcitonin:4,lacticacidven:4)  ) Recent Results (from the past 240 hour(s))  SARS Coronavirus 2 [facility] order, Performed in [facility] hospital lab) Nasopharyngeal Nasopharyngeal Swab     Status: None   Collection Time: 11/16/18  9:28 AM   Specimen: Nasopharyngeal Swab  Result Value Ref Range Status   SARS Coronavirus 2 NEGATIVE NEGATIVE Final    Comment: (NOTE) If result is NEGATIVE SARS-CoV-2 target nucleic acids are NOT DETECTED. The SARS-CoV-2 RNA is generally detectable in upper and lower  respiratory specimens during the acute phase of infection. The lowest  concentration of SARS-CoV-2 viral copies this assay can detect is 250  copies / mL. A negative  result does not preclude SARS-CoV-2 infection  and should not be used as the sole basis for treatment or other  patient management decisions.  A negative result may occur with  improper specimen collection / handling, submission of specimen other  than nasopharyngeal swab, presence of viral mutation(s) within the  areas targeted by this assay, and inadequate number of viral copies  (<250 copies / mL). A negative result must be combined with clinical  observations, patient history, and epidemiological information. If result is POSITIVE SARS-CoV-2 target nucleic acids are DETECTED. The SARS-CoV-2 RNA is generally detectable in upper and lower  respiratory specimens dur ing the acute phase of infection.  Positive  results are indicative of active infection with SARS-CoV-2.  Clinical  correlation with patient history and other diagnostic information is  necessary to determine patient infection status.  Positive results do  not rule out bacterial infection or co-infection with other viruses. If result is PRESUMPTIVE POSTIVE SARS-CoV-2 nucleic acids MAY BE PRESENT.   A presumptive positive result was obtained on the submitted specimen   and confirmed on repeat testing.  While 2019 novel coronavirus  (SARS-CoV-2) nucleic acids may be present in the submitted sample  additional confirmatory testing may be necessary for epidemiological  and / or clinical management purposes  to differentiate between  SARS-CoV-2 and other Sarbecovirus currently known to infect humans.  If clinically indicated additional testing with an alternate test  methodology [phone]) is advised. The SARS-CoV-2 RNA is generally  detectable in upper and lower respiratory sp ecimens during the acute  phase of infection. The expected result is Negative. Fact Sheet for Patients:  BoilerBrush.com.cy Fact Sheet for Healthcare Providers: https://pope.com/ This test is not yet approved or cleared by the [place] FDA and has been authorized for detection and/or diagnosis of SARS-CoV-2 by FDA under an Emergency Use Authorization (EUA).  This EUA will remain in effect (meaning this test can be used) for the duration of the COVID-19 declaration under Section 564(b)(1) of the Act, 21 U.S.C. section 360bbb-3(b)(1), unless the authorization is terminated or revoked sooner. Performed at [facility], [address] [place], [place] [address]          Radiology Studies: Dg Chest Port 1 View  Result Date: 11/17/2018 CLINICAL DATA:  Shortness of breath. EXAM: PORTABLE CHEST 1 VIEW COMPARISON:  None. FINDINGS: The heart size and mediastinal contours are within normal limits. Both lungs are clear. The visualized skeletal structures are unremarkable. IMPRESSION: No active disease. Electronically Signed   By: Obie Dredge M.D.   On: 11/17/2018 18:42    Scheduled Meds: . docusate sodium  100 mg Oral BID  . enoxaparin (LOVENOX) injection  40 mg Subcutaneous Q24H  . sodium chloride flush  10-40 mL Intracatheter Q12H   Continuous Infusions: . lacosamide (VIMPAT) IV 300 mg (11/17/18 2135)  .  levETIRAcetam 2,500 mg (11/18/18 0359)     LOS: 2 days    Time spent: 30 minutes  Canary Brim, AG-ACNP-S [facility]   [facility] 11/18/2018, 8:13 AM     Please page through AMION:  [email or web address] Password [email or web address] If 7PM-7AM, please contact night-coverage

## 2019-02-11 HISTORY — PX: VAGUS NERVE STIMULATOR INSERTION: SHX348

## 2019-05-18 DIAGNOSIS — Z9689 Presence of other specified functional implants: Secondary | ICD-10-CM | POA: Insufficient documentation

## 2019-08-16 ENCOUNTER — Ambulatory Visit: Payer: Medicare Other | Attending: Internal Medicine

## 2019-08-16 DIAGNOSIS — Z23 Encounter for immunization: Secondary | ICD-10-CM

## 2019-08-16 NOTE — Progress Notes (Signed)
   Covid-19 Vaccination Clinic  Name:  Adam [name]    MRN: [id number] DOB: 1980/07/27  08/16/2019  [name] was observed post Covid-19 immunization for 15 minutes without incident. He was provided with Vaccine Information Sheet and instruction to access the V-Safe system.   [name] was instructed to call 911 with any severe reactions post vaccine: Marland Kitchen Difficulty breathing  . Swelling of face and throat  . A fast heartbeat  . A bad rash all over body  . Dizziness and weakness   Immunizations Administered    Name Date Dose VIS Date Route   Pfizer COVID-19 Vaccine 08/16/2019 10:48 AM 0.3 mL 04/06/2018 Intramuscular   Manufacturer: [employer], [employer]   Lot: [id number]   NDC: [address]-1

## 2019-09-09 ENCOUNTER — Ambulatory Visit: Payer: Medicare Other

## 2019-09-19 ENCOUNTER — Ambulatory Visit: Payer: Medicare Other | Attending: Internal Medicine

## 2019-09-19 DIAGNOSIS — Z23 Encounter for immunization: Secondary | ICD-10-CM

## 2019-09-19 NOTE — Progress Notes (Signed)
   Covid-19 Vaccination Clinic  Name:  Adam [name]    MRN: [id number] DOB: 24-Jun-1980  09/19/2019  Mr. Adam [name] was observed post Covid-19 immunization for 15 minutes without incident. He was provided with Vaccine Information Sheet and instruction to access the V-Safe system.   Mr. Adam [name] was instructed to call 911 with any severe reactions post vaccine: Marland Kitchen Difficulty breathing  . Swelling of face and throat  . A fast heartbeat  . A bad rash all over body  . Dizziness and weakness   Immunizations Administered    Name Date Dose VIS Date Route   Pfizer COVID-19 Vaccine 09/19/2019  3:17 PM 0.3 mL 04/06/2018 Intramuscular   Manufacturer: [employer], [employer]   Lot: [id number]   NDC: [address]-4

## 2019-10-22 ENCOUNTER — Ambulatory Visit (HOSPITAL_COMMUNITY)
Admission: EM | Admit: 2019-10-22 | Discharge: 2019-10-22 | Disposition: A | Discharge status: Home / Self Care | Payer: Medicare Other | Attending: Emergency Medicine | Admitting: Emergency Medicine

## 2019-10-22 ENCOUNTER — Other Ambulatory Visit: Payer: Self-pay

## 2019-10-22 DIAGNOSIS — Z20822 Contact with and (suspected) exposure to covid-19: Secondary | ICD-10-CM | POA: Insufficient documentation

## 2019-10-22 NOTE — ED Triage Notes (Signed)
Nurse visit. Pt needs COVID test in order to see his newborn baby that was just born at the hospital. Pt denies sx.

## 2019-10-23 LAB — SARS CORONAVIRUS 2 (TAT 6-24 HRS): SARS Coronavirus 2: NEGATIVE

## 2019-11-13 IMAGING — CT CT HEAD W/O CM
3 series · 14 of 47 positions shown, 16 images · non-contrast
Comparison: None.

CLINICAL DATA: Seizures with history of the same.

EXAM:
CT HEAD WITHOUT CONTRAST
TECHNIQUE: Contiguous axial images were obtained from the base of the skull
through the vertex without intravenous contrast.

[Series 2: head wo · axial · 0.47mm/px · z∈[-151,-26]mm · 8 of 30 slices shown, 10 images]
[im 3/30  brain]
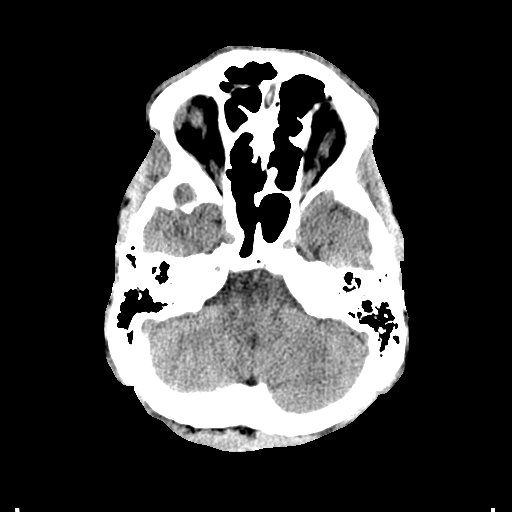
[im 3/30  bone]
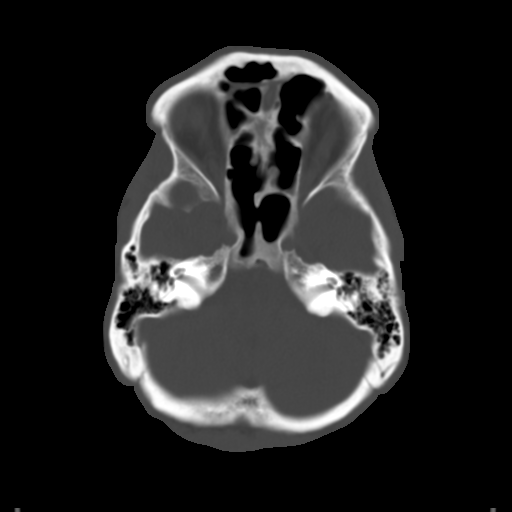
[im 7/30  brain]
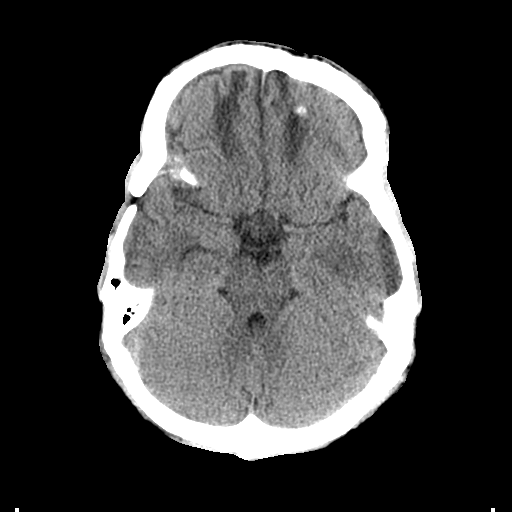
[im 10/30  brain]
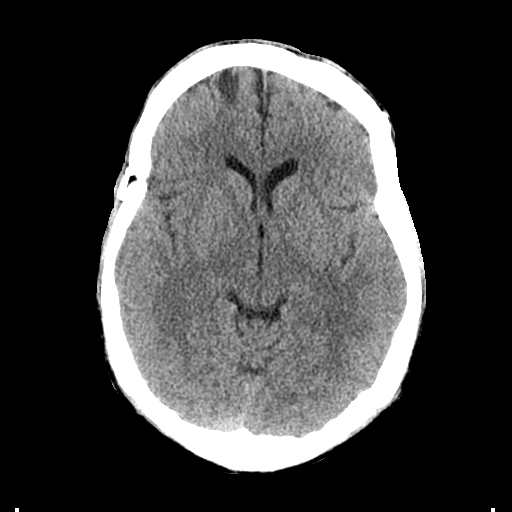
[im 14/30  brain]
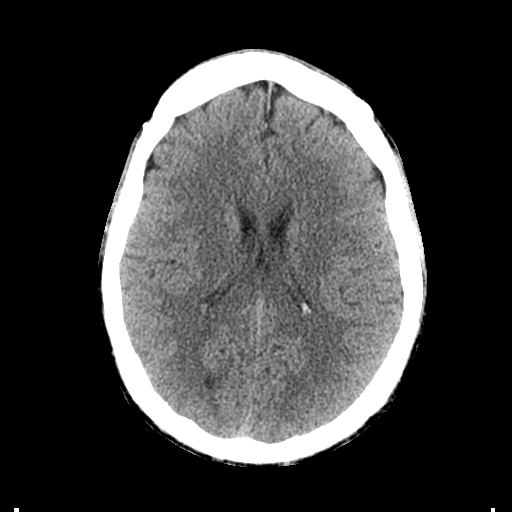
[im 17/30  brain]
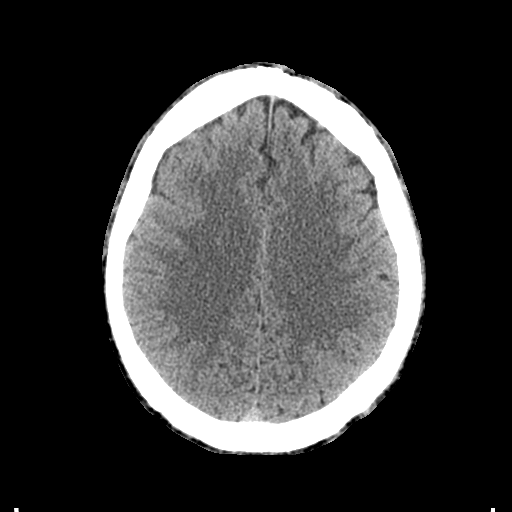
[im 17/30  bone]
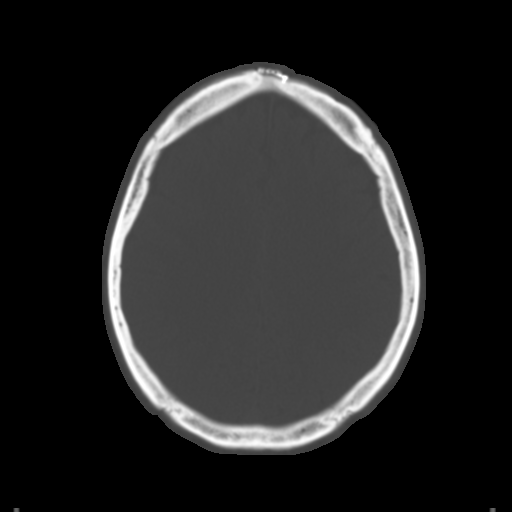
[im 21/30  brain]
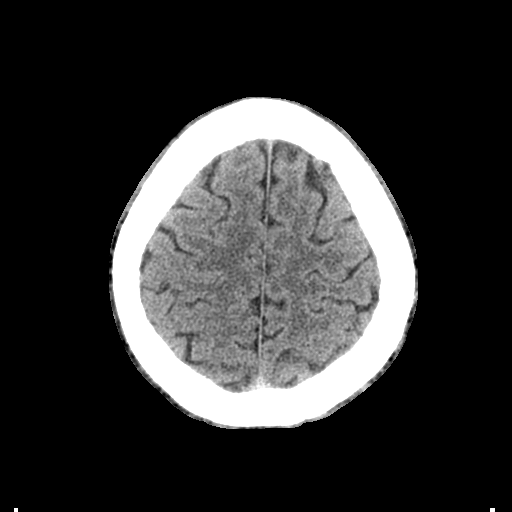
[im 24/30  brain]
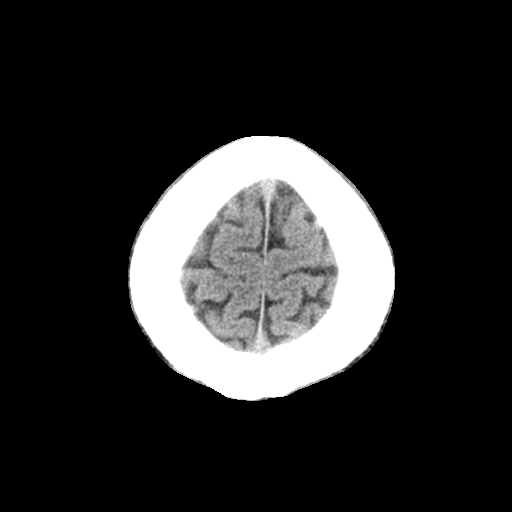
[im 28/30  brain]
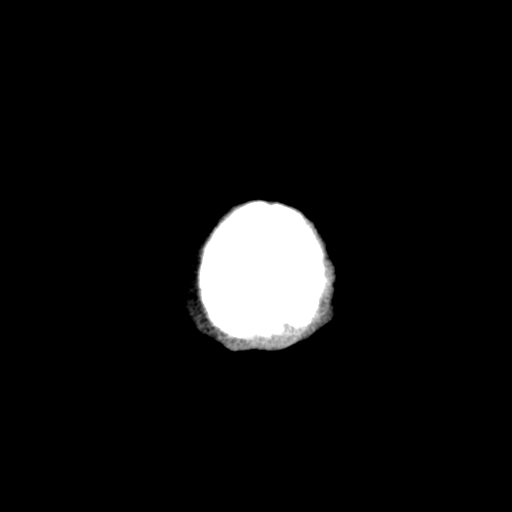

[Series 5: coronal soft tissue · coronal · 0.29mm/px · 3 of 71 slices shown]
[im 24/71  brain]
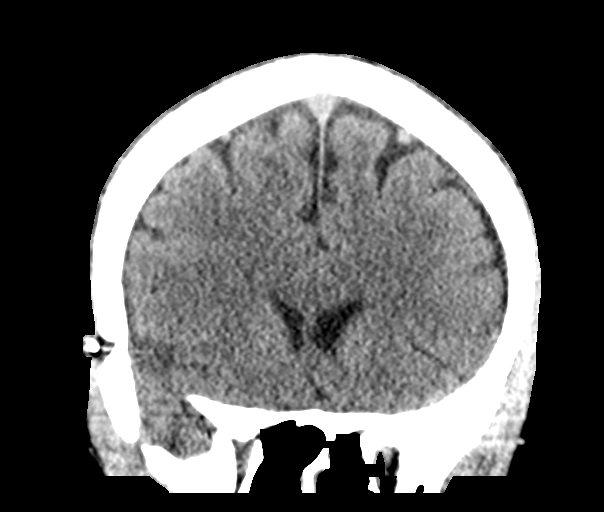
[im 32/71  brain]
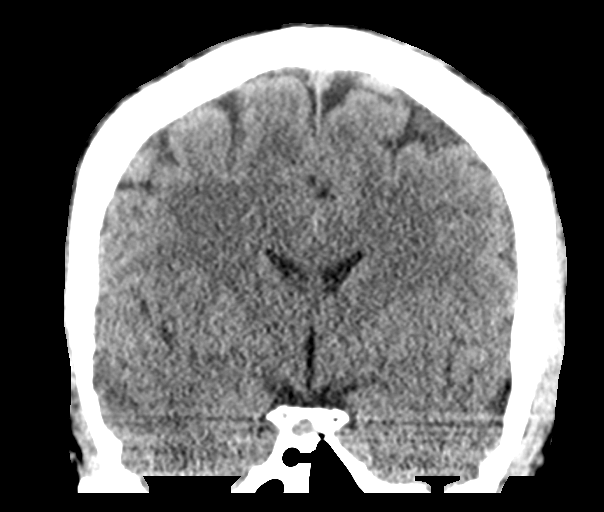
[im 39/71  brain]
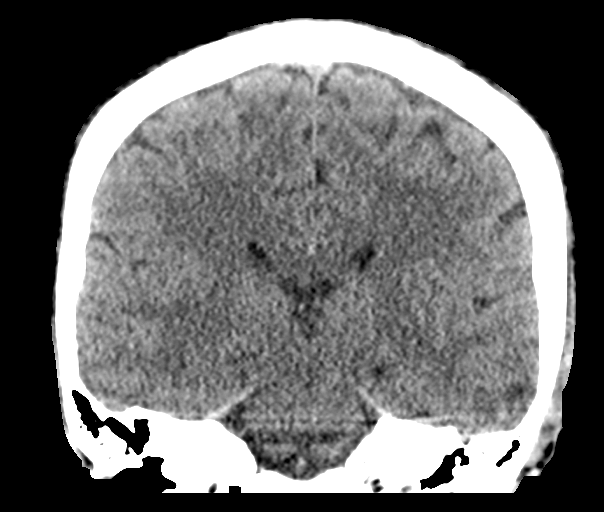

[Series 6: sagittal soft tissue · sagittal · 0.29mm/px · 3 of 59 slices shown]
[im 20/59  brain]
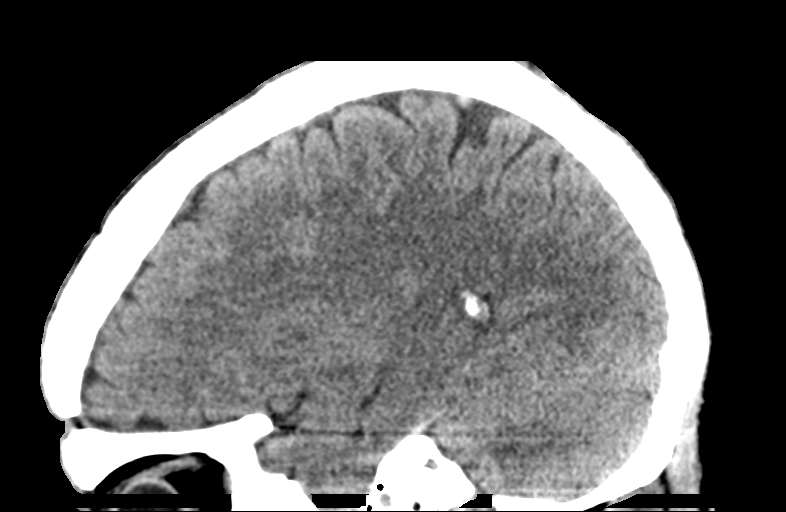
[im 30/59  brain]
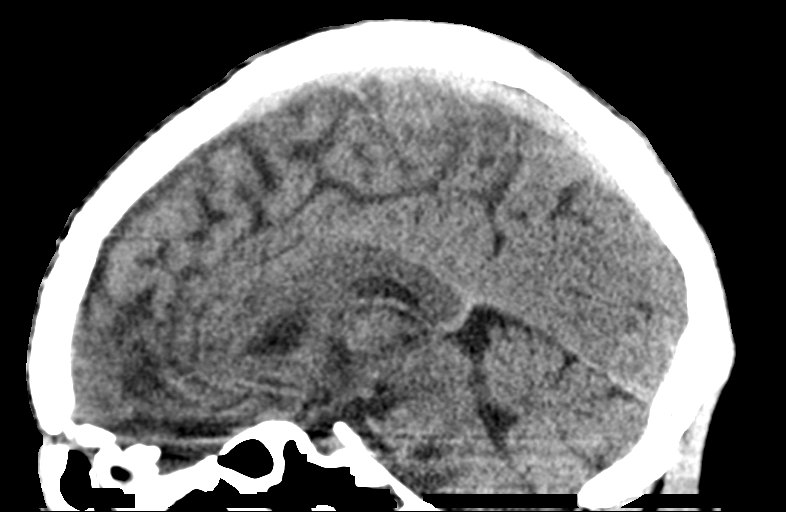
[im 39/59  brain]
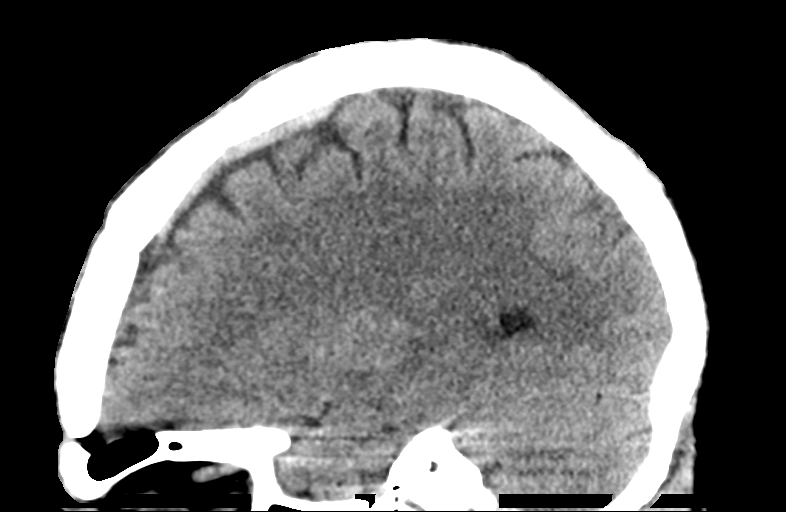

[14 of 47 positions shown; findings below may reference images not displayed]

FINDINGS: Brain: Sequelae of bifrontal and right temporal craniotomy are
identified. Low density in the anterior frontal lobes, right
slightly greater than left, has an appearance most compatible with
encephalomalacia. No definite acute infarct, intracranial
hemorrhage, midline shift, or extra-axial fluid collection is
identified. The ventricles are normal in size aside from slight ex
vacuo enlargement of the frontal horns.

Vascular: No hyperdense vessel.

Skull: Craniotomy changes. No acute fracture or suspicious osseous
lesion.

Sinuses/Orbits: Partially visualized extensive postoperative changes
involving the paranasal sinuses and nasal cavity. Craniotomy
involves the roof of the frontal sinuses. Visualized mastoid air
cells are clear. Visualized orbits are unremarkable.

Other: None.
IMPRESSION: 1. Postsurgical changes with encephalomalacia in the anterior
frontal lobes.
2. No acute intracranial abnormality identified.

## 2019-12-27 ENCOUNTER — Emergency Department (HOSPITAL_COMMUNITY)
Admission: EM | Admit: 2019-12-27 | Discharge: 2019-12-27 | Disposition: A | Discharge status: Home / Self Care | Payer: Medicare Other | Attending: Emergency Medicine | Admitting: Emergency Medicine

## 2019-12-27 ENCOUNTER — Encounter (HOSPITAL_COMMUNITY): Payer: Self-pay | Admitting: Emergency Medicine

## 2019-12-27 ENCOUNTER — Other Ambulatory Visit: Payer: Self-pay

## 2019-12-27 DIAGNOSIS — R569 Unspecified convulsions: Secondary | ICD-10-CM

## 2019-12-27 NOTE — ED Provider Notes (Signed)
[facility] EMERGENCY DEPARTMENT Provider Note   CSN: [id number] Arrival date & time: 12/27/19  1742     History Chief Complaint  Patient presents with  . Seizures    Adam [name] is a 39 y.o. male.  Patient with history of seizures on Keppra, Vimpat, Onfi --presents to the emergency department today after having a seizure.  Per EMS report, patient had a seizure lasting approximately 2 minutes.  Patient was confused on EMS arrival and is back to baseline by ED arrival.  Patient states compliance with his medications.  He denies any acute triggers including lack of sleep or stress.  Last seizure was about a year ago.  He has a minimal headache at the current time. Patient denies signs of stroke including: facial droop, slurred speech, aphasia, weakness/numbness in extremities, imbalance/trouble walking.  No recent signs of infection or illness.  He states that he is feeling well.        Past Medical History:  Diagnosis Date  . 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 Seizures [facility])     Patient Active Problem List   Diagnosis Date Noted  . Status epilepticus (HCC) 11/16/2018  . Marijuana abuse 11/16/2018    Past Surgical History:  Procedure Laterality Date  . NASAL SINUS SURGERY  2002       Family History  Problem Relation Age of Onset  . Seizures Neg Hx     Social History   Tobacco Use  . Smoking status: Never Smoker  . Smokeless tobacco: Never Used  Vaping Use  . Vaping Use: Never used  Substance Use Topics  . Alcohol use: Yes    Comment: Ocassional and Socially  . Drug use: Yes    Types: Marijuana    Comment: daily use    Home Medications Prior to Admission medications   Medication Sig Start Date End Date Taking? Authorizing Provider  cloBAZam (ONFI) 10 MG tablet Take 1 tablet (10 mg total) by mouth 2 (two) times daily. 11/18/18   Danford, Earl Lites, MD  Lacosamide (VIMPAT) 100 MG TABS Take 3 tablets (300 mg total) by mouth  2 (two) times daily. 11/18/18 12/18/18  Alberteen Sam, MD  levETIRAcetam (KEPPRA) 1000 MG tablet Take 2,500 mg by mouth 2 (two) times daily. 10/27/18   [provider]  LORazepam (ATIVAN) 2 MG/ML injection Inject 1 mL (2 mg total) into the muscle once as needed for up to 1 dose for seizure. 11/18/18   Danford, Earl Lites, MD    Allergies    Patient has no known allergies.  Review of Systems   Review of Systems  Constitutional: Negative for fever.  HENT: Negative for rhinorrhea and sore throat.   Eyes: Negative for redness.  Respiratory: Negative for cough.   Cardiovascular: Negative for chest pain.  Gastrointestinal: Negative for abdominal pain, diarrhea, nausea and vomiting.  Genitourinary: Negative for dysuria and hematuria.  Musculoskeletal: Negative for myalgias.  Skin: Negative for rash.  Neurological: Positive for seizures and headaches.    Physical Exam Updated Vital Signs BP 116/77   Pulse 72   Temp 97.7 F (36.5 C) (Oral)   Resp 15   SpO2 99%   Physical Exam Vitals and nursing note reviewed.  Constitutional:      Appearance: He is well-developed.  HENT:     Head: Normocephalic and atraumatic.     Right Ear: Tympanic membrane, ear canal and external ear normal.     Left Ear: Tympanic membrane, ear canal and  external ear normal.     Nose: Nose normal.     Mouth/Throat:     Pharynx: Uvula midline.  Eyes:     General: Lids are normal.     Conjunctiva/sclera: Conjunctivae normal.     Pupils: Pupils are equal, round, and reactive to light.  Cardiovascular:     Rate and Rhythm: Normal rate and regular rhythm.  Pulmonary:     Effort: Pulmonary effort is normal.     Breath sounds: Normal breath sounds.  Abdominal:     Palpations: Abdomen is soft.     Tenderness: There is no abdominal tenderness.  Musculoskeletal:        General: Normal range of motion.     Cervical back: Normal range of motion and neck supple. No tenderness or bony tenderness.    Skin:    General: Skin is warm and dry.  Neurological:     Mental Status: He is alert and oriented to person, place, and time.     GCS: GCS eye subscore is 4. GCS verbal subscore is 5. GCS motor subscore is 6.     Cranial Nerves: No cranial nerve deficit.     Sensory: No sensory deficit.     Motor: No abnormal muscle tone.     Coordination: Coordination normal.     Gait: Gait normal.     ED Results / Procedures / Treatments   Labs (all labs ordered are listed, but only abnormal results are displayed) Labs Reviewed - No data to display  EKG None  Radiology No results found.  Procedures Procedures (including critical care time)  Medications Ordered in ED Medications - No data to display  ED Course  I have reviewed the triage vital signs and the nursing notes.  Pertinent labs & imaging results that were available during my care of the patient were reviewed by me and considered in my medical decision making (see chart for details).  Patient seen and examined.  Will monitor.  At this point, patient does not have any indications for work-up.  If he continues to do well, plan for discharged home to continue home medications.  Vital signs reviewed and are as follows: BP 116/77   Pulse 72   Temp 97.7 F (36.5 C) (Oral)   Resp 15   SpO2 99%   8:26 PM patient doing well during observation.  He is requesting discharged home.  Family at bedside.  They confirm that he has not had a seizure in 14 months.  They are comfortable with discharge home at this time with continuation of current regimen.  They are happy with recent seizure control.  Encouraged to return to the emergency department with additional seizure within 24 hours, follow-up with prescribing physician.  They verbalize understanding agree with plan.   MDM Rules/Calculators/A&P                          Patient here with breakthrough seizure today.  Seizure was typical for the patient.  He had expected postictal state  with recovery.  He is compliant with medications.  He has been doing well recently regarding seizure control.  He is back at baseline.  Family to monitor.  Ready for discharge.  Final Clinical Impression(s) / ED Diagnoses Final diagnoses:  Seizure [facility])    Rx / DC Orders ED Discharge Orders    None       Renne Crigler, Cordelia Poche 12/27/19 2028    Cherlynn Perches  C, MD 12/27/19 2220

## 2019-12-27 NOTE — Discharge Instructions (Signed)
Continue taking your home medications as prescribed, get plenty of rest, eat and drink well.  If you have additional seizures or recurrent seizures, please return to the emergency department for further evaluation.

## 2019-12-27 NOTE — ED Triage Notes (Signed)
Pt BIB [facility] from home, had witnessed seizure by spouse lasting approx 2 minutes. Pt with hx of seizures, states last seizure over one year ago. On EMS arrival, pt confused, on arrival to ED pt A&O x 4. EMS VSS.

## 2020-09-18 ENCOUNTER — Encounter (HOSPITAL_BASED_OUTPATIENT_CLINIC_OR_DEPARTMENT_OTHER): Payer: Self-pay

## 2020-09-18 ENCOUNTER — Emergency Department (HOSPITAL_BASED_OUTPATIENT_CLINIC_OR_DEPARTMENT_OTHER)
Admission: EM | Admit: 2020-09-18 | Discharge: 2020-09-18 | Disposition: A | Discharge status: Home / Self Care | Payer: Medicare Other | Attending: Emergency Medicine | Admitting: Emergency Medicine

## 2020-09-18 ENCOUNTER — Other Ambulatory Visit: Payer: Self-pay

## 2020-09-18 DIAGNOSIS — G40211 Localization-related (focal) (partial) symptomatic epilepsy and epileptic syndromes with complex partial seizures, intractable, with status epilepticus: Secondary | ICD-10-CM | POA: Diagnosis not present

## 2020-09-18 DIAGNOSIS — R569 Unspecified convulsions: Secondary | ICD-10-CM | POA: Diagnosis present

## 2020-09-18 DIAGNOSIS — G40209 Localization-related (focal) (partial) symptomatic epilepsy and epileptic syndromes with complex partial seizures, not intractable, without status epilepticus: Secondary | ICD-10-CM

## 2020-09-18 MED ORDER — MIDAZOLAM HCL 2 MG/2ML IJ SOLN: 4.0000 mg | Freq: Once | INTRAMUSCULAR | Status: DC

## 2020-09-18 MED ORDER — LORAZEPAM 2 MG/ML IJ SOLN: 2.0000 mg | Freq: Once | INTRAMUSCULAR | 2 refills | Status: AC | PRN

## 2020-09-18 MED ORDER — MIDAZOLAM HCL 2 MG/2ML IJ SOLN
4.0000 mg | Freq: Once | INTRAMUSCULAR | Status: AC
  Administered 2020-09-18: 4 mg via INTRAVENOUS
  Filled 2020-09-18: qty 4

## 2020-09-18 NOTE — ED Notes (Signed)
ED Provider at bedside. 

## 2020-09-18 NOTE — ED Notes (Signed)
Patient verbalizes understanding of discharge instructions. Opportunity for questioning and answers were provided. Patient discharged from ED.  °

## 2020-09-18 NOTE — ED Notes (Signed)
Seizure pads on side-rails, bed in lowest position, call bell within reach.

## 2020-09-18 NOTE — ED Triage Notes (Signed)
Pt brought in by [facility].  Per EMS, pt missed seizure medication last night.  Had seizure around 10 pm last night and 2 more this am around 6.  Pt was post-ictal per EMS but is more alert now.  Pt transferred to stretcher, was noted to be incontinent of urine and stool, requesting something to drink.

## 2020-09-18 NOTE — ED Provider Notes (Signed)
[facility] EMERGENCY DEPT Provider Note   CSN: [id number] Arrival date & time: 09/18/20  0710     History Chief Complaint  Patient presents with  . Seizures    Adam [name] is a 40 y.o. male.  Wendelyn Breslow had a short seizure last night.  It lasted less than 1 minute.  Around 6 AM, he had 2 back-to-back seizures.  His wife attempted to medicate him with clonazepam.  The patient had been out of his clobazam for 1 day due to transferring pharmacies.  He currently has a prescription at his pharmacy.  Seizures were typical of his prior seizures, but the clustering in length was more severe than usual.  No other known triggers for breakthrough seizures.  The history is provided by the patient and the EMS personnel.  Seizures Seizure activity on arrival: yes   Seizure type:  Grand mal Preceding symptoms comment:  Lip smacking Initial focality:  None Episode characteristics: combativeness, confusion, generalized shaking and incontinence   Postictal symptoms: confusion   Return to baseline: yes   Severity:  Severe Duration:  15 minutes Timing:  Clustered Number of seizures this episode:  2 Progression:  Improving Context: medical non-compliance (ran out of a medication)   Recent head injury:  No recent head injuries PTA treatment:  None History of seizures: yes   Similar to previous episodes: yes   Seizure control level:  Well controlled Current therapy:  Levetiracetam (Vimpat, Onfi) Compliance with current therapy:  Good     Past Medical History:  Diagnosis Date  . 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 Seizures [facility])     Patient Active Problem List   Diagnosis Date Noted  . 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 Status epilepticus (HCC) 11/16/2018  . Marijuana abuse 11/16/2018  . Encephalomalacia on imaging study 10/01/2018  . 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 NASAL SINUS SURGERY  2002  . VAGUS NERVE STIMULATOR INSERTION Left 2021       Family History  Problem Relation Age of Onset  . Seizures Neg Hx     Social History   Tobacco Use  . Smoking status: Never  . Smokeless tobacco: Never  Vaping Use  . Vaping Use: Never used  Substance Use Topics  . Alcohol use: Yes    Comment: Ocassional and Socially  . Drug use: Yes    Types: Marijuana    Comment: daily use    Home Medications Prior to Admission medications   Medication Sig Start Date End Date Taking? Authorizing Provider  cloBAZam (ONFI) 10 MG tablet Take 1 tablet (10 mg total) by mouth 2 (two) times daily. 11/18/18   Danford, Earl Lites, MD  Lacosamide (VIMPAT) 100 MG TABS Take 3 tablets (300 mg total) by mouth 2 (two) times daily. 11/18/18 12/18/18  Alberteen Sam, MD  levETIRAcetam (KEPPRA) 1000 MG tablet Take 2,500 mg by mouth 2 (two) times daily. 10/27/18   [provider]  LORazepam (ATIVAN) 2 MG/ML injection Inject 1 mL (2 mg total) into the muscle once as needed for up to 1 dose for seizure. 11/18/18   Danford, Earl Lites, MD    Allergies    Patient has no known allergies.  Review of Systems   Review of Systems  Constitutional:  Negative for chills and fever.  HENT:  Negative for ear pain and sore throat.   Eyes:  Negative for pain and visual disturbance.  Respiratory:  Negative for cough and shortness of breath.   Cardiovascular:  Negative for chest pain and palpitations.  Gastrointestinal:  Negative for abdominal pain and vomiting.  Genitourinary:  Negative for dysuria and hematuria.  Musculoskeletal:  Negative for arthralgias and back pain.  Skin:  Negative for color change and rash.  Neurological:  Positive for seizures. Negative for syncope.  All other systems reviewed and are negative.  Physical Exam Updated Vital Signs BP 120/72 (BP Location: Right Arm)   Pulse 68   Temp 97.8 F (36.6 C)  (Oral)   Resp 14   Ht 5\' 9"  (1.753 m)   Wt 61.2 kg   SpO2 100%   BMI 19.94 kg/m   Physical Exam Vitals and nursing note reviewed.  Constitutional:      Appearance: Normal appearance.  HENT:     Head: Normocephalic and atraumatic.  Eyes:     Extraocular Movements: Extraocular movements intact.     Conjunctiva/sclera: Conjunctivae normal.     Pupils: Pupils are equal, round, and reactive to light.  Pulmonary:     Effort: Pulmonary effort is normal. No respiratory distress.  Musculoskeletal:        General: No deformity. Normal range of motion.     Cervical back: Normal range of motion.  Skin:    General: Skin is warm and dry.  Neurological:     General: No focal deficit present.     Mental Status: He is alert and oriented to person, place, and time. Mental status is at baseline.     Sensory: No sensory deficit.     Motor: No weakness.  Psychiatric:        Mood and Affect: Mood normal.    ED Results / Procedures / Treatments   Labs (all labs ordered are listed, but only abnormal results are displayed) Labs Reviewed - No data to display  EKG None  Radiology No results found.  Procedures Procedures   Medications Ordered in ED Medications  midazolam PF (VERSED) injection 4 mg (has no administration in time range)    ED Course  I have reviewed the triage vital signs and the nursing notes.  Pertinent labs & imaging results that were available during my care of the patient were reviewed by me and considered in my medical decision making (see chart for details).  Clinical Course as of 09/18/20 0933  Tue Sep 18, 2020  Sep 20, 2020 Patient is doing well.  He is still slightly sleepy but feels like he can go home.  Wife is at the bedside.  They requested Ativan autoinjector for breakthrough seizures.  It is easy to administer this medication when the patient is seething.  She finds intranasal midazolam and oral clonazepam slightly more challenging. [AW]    Clinical Course  User Index [AW] 9798, MD   MDM Rules/Calculators/A&P                           Adam [name] presents with a seizure after missing 1 dose of medication.  Seizure was typical of his prior seizure activity.  He is currently sleepy but at his baseline otherwise.  He does have access to his medication.  Abortive therapy was prescribed as below.  I counseled them to notify their neurologist today about what happened.  Return precautions were discussed. Final Clinical Impression(s) / ED Diagnoses Final diagnoses:  Partial epilepsy with impairment of consciousness (HCC)  Rx / DC Orders ED Discharge Orders          Ordered    LORazepam (ATIVAN) 2 MG/ML injection  Once PRN        09/18/20 0928             Adam Distance, MD 09/18/20 [phone]

## 2020-11-28 ENCOUNTER — Inpatient Hospital Stay (HOSPITAL_BASED_OUTPATIENT_CLINIC_OR_DEPARTMENT_OTHER)
Admission: EM | Admit: 2020-11-28 | Discharge: 2020-12-06 | DRG: 094 | Disposition: A | Discharge status: Home Health | Payer: Medicare Other | Attending: Internal Medicine | Admitting: Internal Medicine

## 2020-11-28 ENCOUNTER — Emergency Department (HOSPITAL_BASED_OUTPATIENT_CLINIC_OR_DEPARTMENT_OTHER): Payer: Medicare Other

## 2020-11-28 ENCOUNTER — Encounter (HOSPITAL_COMMUNITY): Payer: Self-pay | Admitting: Internal Medicine

## 2020-11-28 ENCOUNTER — Other Ambulatory Visit: Payer: Self-pay

## 2020-11-28 DIAGNOSIS — G40919 Epilepsy, unspecified, intractable, without status epilepticus: Secondary | ICD-10-CM | POA: Diagnosis present

## 2020-11-28 DIAGNOSIS — E8809 Other disorders of plasma-protein metabolism, not elsewhere classified: Secondary | ICD-10-CM | POA: Diagnosis present

## 2020-11-28 DIAGNOSIS — I639 Cerebral infarction, unspecified: Secondary | ICD-10-CM | POA: Diagnosis not present

## 2020-11-28 DIAGNOSIS — G9389 Other specified disorders of brain: Secondary | ICD-10-CM | POA: Diagnosis present

## 2020-11-28 DIAGNOSIS — I6389 Other cerebral infarction: Secondary | ICD-10-CM | POA: Diagnosis present

## 2020-11-28 DIAGNOSIS — F32A Depression, unspecified: Secondary | ICD-10-CM | POA: Diagnosis present

## 2020-11-28 DIAGNOSIS — G40803 Other epilepsy, intractable, with status epilepticus: Secondary | ICD-10-CM | POA: Diagnosis not present

## 2020-11-28 DIAGNOSIS — R509 Fever, unspecified: Secondary | ICD-10-CM

## 2020-11-28 DIAGNOSIS — G039 Meningitis, unspecified: Secondary | ICD-10-CM | POA: Diagnosis not present

## 2020-11-28 DIAGNOSIS — Z9114 Patient's other noncompliance with medication regimen: Secondary | ICD-10-CM

## 2020-11-28 DIAGNOSIS — R6510 Systemic inflammatory response syndrome (SIRS) of non-infectious origin without acute organ dysfunction: Secondary | ICD-10-CM | POA: Diagnosis present

## 2020-11-28 DIAGNOSIS — Z79899 Other long term (current) drug therapy: Secondary | ICD-10-CM | POA: Diagnosis not present

## 2020-11-28 DIAGNOSIS — F419 Anxiety disorder, unspecified: Secondary | ICD-10-CM | POA: Diagnosis present

## 2020-11-28 DIAGNOSIS — G009 Bacterial meningitis, unspecified: Secondary | ICD-10-CM | POA: Diagnosis present

## 2020-11-28 DIAGNOSIS — F1210 Cannabis abuse, uncomplicated: Secondary | ICD-10-CM | POA: Diagnosis present

## 2020-11-28 DIAGNOSIS — Z20822 Contact with and (suspected) exposure to covid-19: Secondary | ICD-10-CM | POA: Diagnosis present

## 2020-11-28 DIAGNOSIS — G40909 Epilepsy, unspecified, not intractable, without status epilepticus: Secondary | ICD-10-CM | POA: Diagnosis present

## 2020-11-28 LAB — CBC WITH DIFFERENTIAL/PLATELET
Abs Immature Granulocytes: 0.14 10*3/uL — ABNORMAL HIGH (ref 0.00–0.07)
Basophils Absolute: 0 10*3/uL (ref 0.0–0.1)
Basophils Relative: 0 %
Eosinophils Absolute: 0 10*3/uL (ref 0.0–0.5)
Eosinophils Relative: 0 %
HCT: 42 % (ref 39.0–52.0)
Hemoglobin: 13.5 g/dL (ref 13.0–17.0)
Immature Granulocytes: 1 %
Lymphocytes Relative: 7 %
Lymphs Abs: 1.5 10*3/uL (ref 0.7–4.0)
MCH: 29.7 pg (ref 26.0–34.0)
MCHC: 32.1 g/dL (ref 30.0–36.0)
MCV: 92.5 fL (ref 80.0–100.0)
Monocytes Absolute: 1.2 10*3/uL — ABNORMAL HIGH (ref 0.1–1.0)
Monocytes Relative: 5 %
Neutro Abs: 20.4 10*3/uL — ABNORMAL HIGH (ref 1.7–7.7)
Neutrophils Relative %: 87 %
Platelets: 265 10*3/uL (ref 150–400)
RBC: 4.54 MIL/uL (ref 4.22–5.81)
RDW: 12.6 % (ref 11.5–15.5)
WBC: 23.4 10*3/uL — ABNORMAL HIGH (ref 4.0–10.5)
nRBC: 0 % (ref 0.0–0.2)

## 2020-11-28 LAB — CSF CELL COUNT WITH DIFFERENTIAL
Eosinophils, CSF: 0 % (ref 0–1)
Eosinophils, CSF: 0 % (ref 0–1)
Lymphs, CSF: 2 % — ABNORMAL LOW (ref 40–80)
Lymphs, CSF: 2 % — ABNORMAL LOW (ref 40–80)
Monocyte-Macrophage-Spinal Fluid: 1 % — ABNORMAL LOW (ref 15–45)
Monocyte-Macrophage-Spinal Fluid: 2 % — ABNORMAL LOW (ref 15–45)
RBC Count, CSF: 38 /mm3 — ABNORMAL HIGH
RBC Count, CSF: 64 /mm3 — ABNORMAL HIGH
Segmented Neutrophils-CSF: 96 % — ABNORMAL HIGH (ref 0–6)
Segmented Neutrophils-CSF: 97 % — ABNORMAL HIGH (ref 0–6)
Tube #: 1
Tube #: 4
WBC, CSF: 2855 /mm3 (ref 0–5)
WBC, CSF: 4743 /mm3 (ref 0–5)

## 2020-11-28 LAB — COMPREHENSIVE METABOLIC PANEL
ALT: 26 U/L (ref 0–44)
AST: 30 U/L (ref 15–41)
Albumin: 4.8 g/dL (ref 3.5–5.0)
Alkaline Phosphatase: 78 U/L (ref 38–126)
Anion gap: 29 — ABNORMAL HIGH (ref 5–15)
BUN: 10 mg/dL (ref 6–20)
CO2: 12 mmol/L — ABNORMAL LOW (ref 22–32)
Calcium: 10.2 mg/dL (ref 8.9–10.3)
Chloride: 95 mmol/L — ABNORMAL LOW (ref 98–111)
Creatinine, Ser: 1.24 mg/dL (ref 0.61–1.24)
GFR, Estimated: >60 mL/min (ref >60)
Glucose, Bld: 164 mg/dL — ABNORMAL HIGH (ref 70–99)
Potassium: 3.3 mmol/L — ABNORMAL LOW (ref 3.5–5.1)
Sodium: 136 mmol/L (ref 135–145)
Total Bilirubin: 0.5 mg/dL (ref 0.3–1.2)
Total Protein: 8.4 g/dL — ABNORMAL HIGH (ref 6.5–8.1)

## 2020-11-28 LAB — RESP PANEL BY RT-PCR (FLU A&B, COVID) ARPGX2
Influenza A by PCR: NEGATIVE
Influenza B by PCR: NEGATIVE
SARS Coronavirus 2 by RT PCR: NEGATIVE

## 2020-11-28 LAB — PATHOLOGIST SMEAR REVIEW

## 2020-11-28 LAB — LIPASE, BLOOD: Lipase: 20 U/L (ref 11–51)

## 2020-11-28 LAB — PROTEIN, CSF: Total  Protein, CSF: >600 mg/dL — ABNORMAL HIGH (ref 15–45)

## 2020-11-28 LAB — SALICYLATE LEVEL: Salicylate Lvl: <7 mg/dL — ABNORMAL LOW (ref 7.0–30.0)

## 2020-11-28 LAB — ACETAMINOPHEN LEVEL: Acetaminophen (Tylenol), Serum: <10 ug/mL — ABNORMAL LOW (ref 10–30)

## 2020-11-28 LAB — GLUCOSE, CSF: Glucose, CSF: <20 mg/dL — CL (ref 40–70)

## 2020-11-28 LAB — ETHANOL: Alcohol, Ethyl (B): <10 mg/dL (ref <10)

## 2020-11-28 MED ORDER — SODIUM CHLORIDE 0.9 % IV SOLN: 300.0000 mg | Freq: Two times a day (BID) | INTRAVENOUS | Status: DC

## 2020-11-28 MED ORDER — DEXTROSE 5 % IV SOLN
10.0000 mg/kg | Freq: Once | INTRAVENOUS | Status: DC
  Filled 2020-11-28: qty 13.2

## 2020-11-28 MED ORDER — DEXTROSE 5 % IV SOLN
10.0000 mg/kg | Freq: Three times a day (TID) | INTRAVENOUS | Status: DC
  Filled 2020-11-28: qty 13.2

## 2020-11-28 MED ORDER — VANCOMYCIN HCL 750 MG/150ML IV SOLN
750.0000 mg | Freq: Two times a day (BID) | INTRAVENOUS | Status: DC
  Administered 2020-11-29: 750 mg via INTRAVENOUS
  Filled 2020-11-28 (×7): qty 150

## 2020-11-28 MED ORDER — SODIUM CHLORIDE 0.9 % IV SOLN
2.0000 g | Freq: Two times a day (BID) | INTRAVENOUS | Status: DC
  Administered 2020-11-28 – 2020-12-03 (×11): 2 g via INTRAVENOUS
  Filled 2020-11-28 (×13): qty 20

## 2020-11-28 MED ORDER — LEVETIRACETAM IN NACL 1000 MG/100ML IV SOLN: 1000.0000 mg | Freq: Two times a day (BID) | INTRAVENOUS | Status: DC

## 2020-11-28 MED ORDER — DEXTROSE 5 % IV SOLN
10.0000 mg/kg | Freq: Three times a day (TID) | INTRAVENOUS | Status: DC
  Administered 2020-11-28 – 2020-12-03 (×16): 660 mg via INTRAVENOUS
  Filled 2020-11-28 (×21): qty 13.2

## 2020-11-28 MED ORDER — LACOSAMIDE 200 MG/20ML IV SOLN: 300.0000 mg | Freq: Once | INTRAVENOUS | Status: DC

## 2020-11-28 MED ORDER — ACYCLOVIR SODIUM 50 MG/ML IV SOLN
660.0000 mg | Freq: Once | INTRAVENOUS | Status: DC
  Filled 2020-11-28: qty 20
  Filled 2020-11-28: qty 10

## 2020-11-28 MED ORDER — LIDOCAINE HCL (PF) 1 % IJ SOLN
10.0000 mL | Freq: Once | INTRAMUSCULAR | Status: DC
Start: 2020-11-28 — End: 2020-12-06
  Filled 2020-11-28: qty 10

## 2020-11-28 MED ORDER — LEVETIRACETAM IN NACL 1000 MG/100ML IV SOLN
1000.0000 mg | INTRAVENOUS | Status: AC
  Administered 2020-11-28: 1000 mg via INTRAVENOUS
  Filled 2020-11-28: qty 100

## 2020-11-28 MED ORDER — LORAZEPAM 2 MG/ML IJ SOLN
1.0000 mg | Freq: Once | INTRAMUSCULAR | Status: AC
  Administered 2020-11-28: 1 mg via INTRAVENOUS
  Filled 2020-11-28: qty 1

## 2020-11-28 MED ORDER — DEXTROSE 5 % IV SOLN
10.0000 mg/kg | Freq: Three times a day (TID) | INTRAVENOUS | Status: DC
  Administered 2020-11-28: 660 mg via INTRAVENOUS
  Filled 2020-11-28 (×3): qty 13.2

## 2020-11-28 MED ORDER — SODIUM CHLORIDE 0.9 % IV BOLUS: 25.0000 mL | Freq: Once | INTRAVENOUS | Status: DC

## 2020-11-28 MED ORDER — ENOXAPARIN SODIUM 40 MG/0.4ML IJ SOSY
40.0000 mg | PREFILLED_SYRINGE | INTRAMUSCULAR | Status: DC
  Administered 2020-11-28 – 2020-12-05 (×8): 40 mg via SUBCUTANEOUS
  Filled 2020-11-28 (×8): qty 0.4

## 2020-11-28 MED ORDER — ACETAMINOPHEN 325 MG PO TABS
650.0000 mg | ORAL_TABLET | Freq: Four times a day (QID) | ORAL | Status: DC | PRN
  Administered 2020-11-30 (×3): 650 mg via ORAL
  Filled 2020-11-28 (×3): qty 2

## 2020-11-28 MED ORDER — SODIUM CHLORIDE 0.9 % IV SOLN: 2500.0000 mg | Freq: Two times a day (BID) | INTRAVENOUS | Status: DC

## 2020-11-28 MED ORDER — LEVETIRACETAM IN NACL 1000 MG/100ML IV SOLN
1000.0000 mg | Freq: Once | INTRAVENOUS | Status: AC
  Administered 2020-11-28: 1000 mg via INTRAVENOUS
  Filled 2020-11-28: qty 100

## 2020-11-28 MED ORDER — LEVETIRACETAM IN NACL 500 MG/100ML IV SOLN
500.0000 mg | Freq: Two times a day (BID) | INTRAVENOUS | Status: DC
  Administered 2020-11-28: 500 mg via INTRAVENOUS
  Filled 2020-11-28: qty 100

## 2020-11-28 MED ORDER — SODIUM CHLORIDE 0.9 % IV SOLN
300.0000 mg | Freq: Two times a day (BID) | INTRAVENOUS | Status: DC
  Administered 2020-11-29 – 2020-12-05 (×14): 300 mg via INTRAVENOUS
  Filled 2020-11-28 (×4): qty 30
  Filled 2020-11-28: qty 20
  Filled 2020-11-28 (×9): qty 30

## 2020-11-28 MED ORDER — SODIUM CHLORIDE 0.9 % IV SOLN
300.0000 mg | Freq: Two times a day (BID) | INTRAVENOUS | Status: DC
  Filled 2020-11-28 (×2): qty 30

## 2020-11-28 MED ORDER — LEVETIRACETAM IN NACL 1500 MG/100ML IV SOLN
1500.0000 mg | Freq: Once | INTRAVENOUS | Status: DC
Start: 2020-11-28 — End: 2020-11-28

## 2020-11-28 MED ORDER — ACETAMINOPHEN 650 MG RE SUPP
650.0000 mg | RECTAL | Status: DC | PRN
Start: 2020-11-28 — End: 2020-11-28
  Administered 2020-11-28 (×2): 650 mg via RECTAL
  Filled 2020-11-28 (×2): qty 1

## 2020-11-28 MED ORDER — LACOSAMIDE 200 MG/20ML IV SOLN
300.0000 mg | Freq: Once | INTRAVENOUS | Status: DC
  Filled 2020-11-28: qty 30

## 2020-11-28 MED ORDER — ONDANSETRON HCL 4 MG PO TABS: 4.0000 mg | ORAL_TABLET | Freq: Four times a day (QID) | ORAL | Status: DC | PRN

## 2020-11-28 MED ORDER — LORAZEPAM 2 MG/ML IJ SOLN
2.0000 mg | INTRAMUSCULAR | Status: DC | PRN
  Administered 2020-11-28: 2 mg via INTRAVENOUS
  Filled 2020-11-28: qty 1

## 2020-11-28 MED ORDER — DEXTROSE 5 % IV BOLUS
100.0000 mL | Freq: Once | INTRAVENOUS | Status: AC
  Administered 2020-11-28: 100 mL via INTRAVENOUS

## 2020-11-28 MED ORDER — VANCOMYCIN HCL IN DEXTROSE 1-5 GM/200ML-% IV SOLN
1000.0000 mg | INTRAVENOUS | Status: AC
  Administered 2020-11-28: 1000 mg via INTRAVENOUS
  Filled 2020-11-28: qty 200

## 2020-11-28 MED ORDER — LACTATED RINGERS IV BOLUS: 1000.0000 mL | Freq: Once | INTRAVENOUS | Status: DC

## 2020-11-28 MED ORDER — VANCOMYCIN HCL 1250 MG/250ML IV SOLN
1250.0000 mg | Freq: Once | INTRAVENOUS | Status: DC
  Filled 2020-11-28: qty 250

## 2020-11-28 MED ORDER — SODIUM CHLORIDE 0.9 % IV BOLUS
1000.0000 mL | Freq: Once | INTRAVENOUS | Status: AC
  Administered 2020-11-28: 1000 mL via INTRAVENOUS

## 2020-11-28 MED ORDER — ACETAMINOPHEN 650 MG RE SUPP: 650.0000 mg | Freq: Four times a day (QID) | RECTAL | Status: DC | PRN

## 2020-11-28 MED ORDER — DEXAMETHASONE SODIUM PHOSPHATE 10 MG/ML IJ SOLN
10.0000 mg | Freq: Once | INTRAMUSCULAR | Status: AC
  Administered 2020-11-28: 10 mg via INTRAVENOUS
  Filled 2020-11-28: qty 1

## 2020-11-28 MED ORDER — VANCOMYCIN HCL IN DEXTROSE 1-5 GM/200ML-% IV SOLN: 1000.0000 mg | Freq: Once | INTRAVENOUS | Status: DC

## 2020-11-28 MED ORDER — SENNOSIDES-DOCUSATE SODIUM 8.6-50 MG PO TABS: 1.0000 | ORAL_TABLET | Freq: Every evening | ORAL | Status: DC | PRN

## 2020-11-28 MED ORDER — ACETAMINOPHEN 650 MG RE SUPP
650.0000 mg | Freq: Once | RECTAL | Status: AC
  Administered 2020-11-28: 650 mg via RECTAL
  Filled 2020-11-28: qty 1

## 2020-11-28 MED ORDER — ONDANSETRON HCL 4 MG/2ML IJ SOLN: 4.0000 mg | Freq: Four times a day (QID) | INTRAMUSCULAR | Status: DC | PRN

## 2020-11-28 MED ORDER — SODIUM CHLORIDE 0.9 % IV SOLN
2500.0000 mg | Freq: Two times a day (BID) | INTRAVENOUS | Status: DC
  Administered 2020-11-29 – 2020-12-05 (×14): 2500 mg via INTRAVENOUS
  Filled 2020-11-28 (×15): qty 25

## 2020-11-28 MED ORDER — LEVETIRACETAM IN NACL 500 MG/100ML IV SOLN: 500.0000 mg | Freq: Two times a day (BID) | INTRAVENOUS | Status: DC

## 2020-11-28 MED ORDER — LACTATED RINGERS IV SOLN: INTRAVENOUS | Status: AC

## 2020-11-28 MED ORDER — SODIUM CHLORIDE 0.9 % IV SOLN
2.0000 g | Freq: Once | INTRAVENOUS | Status: AC
  Administered 2020-11-28: 2 g via INTRAVENOUS
  Filled 2020-11-28: qty 20

## 2020-11-28 NOTE — ED Notes (Signed)
Talked to Pharm because unable to pull Vancomycin and was advised that the medication is not in our pyxis and would have to be curried. Vampit is also unavailable and was advised that the floor has it on stock. Was told eariler that most of other medications were to be curried over.

## 2020-11-28 NOTE — Progress Notes (Signed)
Pharmacy Antibiotic Note  Adam [name] is a 40 y.o. male admitted on 11/28/2020 with meningitis.  Pharmacy has been consulted for Vancomycin and Acyclovir dosing. Pt also received ceftriaxone 2gm.  Plan: Acyclovir 660mg  (10mg /kg) q8h (x 14 days) Vancomycin 1000mg  IV load (ordered at [facility]) then 750 mg IV Q 12 hrs (x 14 days). Will start second dose slightly closer to first dose as loading dose is under 20mg /kg. Goal AUC 400-550. Expected AUC: 474 SCr used: 1.24 Will f/u renal function, micro data, and pt's clinical condition Vanc levels prn   Height: 5\' 11"  (180.3 cm) Weight: 65.8 kg (145 lb) IBW/kg (Calculated) : 75.3  Temp (24hrs), Avg:100.6 F (38.1 C), Min:100.6 F (38.1 C), Max:100.6 F (38.1 C)  Recent Labs  Lab 11/28/20 0449  WBC 23.4*    CrCl cannot be calculated (Patient's most recent lab result is older than the maximum 21 days allowed.).    No Known Allergies  Antimicrobials this admission: 10/19 Rocephin >>  10/19 Vanc >> 11/1 10/19 Acyclovir >> 11/1  Microbiology results: Pending  Thank you for allowing pharmacy to be a part of this patient's care.  11/19, PharmD, BCPS Please see amion for complete clinical pharmacist phone list 11/28/2020 5:19 AM

## 2020-11-28 NOTE — ED Notes (Signed)
I have witnessed Pt making abnormal movements in bed. Pt is having what Wife says is Focal Seizures. No shaking but Pt is grabbing at things and clinching then not letting go until he comes out of the Focal Seizure. Pt also has been picking at sheets and will continue until he comes out of it and falls back asleep. When doing these things b/c does not speak.This has been happening off and on through out his stay in the ED per wife. Pt is very restless

## 2020-11-28 NOTE — ED Notes (Signed)
Obtained IV access

## 2020-11-28 NOTE — ED Notes (Signed)
Md Aware of no IV access and has recently assessed Pt.

## 2020-11-28 NOTE — ED Notes (Signed)
2nd order was placed for restriants but pt was semi cooperative and restraints will be held off as long a possible. Trying mits now

## 2020-11-28 NOTE — ED Notes (Signed)
Have been unable to pull acyclovir and have been on phone with Pharm in attempt to get access to obtain. Pharm is having trouble as well fixing the problem on their end and will call back

## 2020-11-28 NOTE — ED Notes (Signed)
Pt calm and resting. Family member stated that Pt seemed to be doing better. Restraints checked and are good

## 2020-11-28 NOTE — ED Notes (Signed)
Called [facility] to advise that patient has a bed ready @ [facility], 3W -10.  Advised [facility] that pt would need restraints (mentally altered) , cardiac monitoring, IV Main, and contact precations.  [facility] stated they would have the nurse call and talk with our nurse but it would be a while before they had transport available.

## 2020-11-28 NOTE — Assessment & Plan Note (Signed)
   Patient presenting with 24-hour history of progressive lethargy and confusion with a witnessed episode of several minutes of tonic-clonic seizure activity per the wife   On arrival, patient exhibiting multiple SIRS criteria including recurrent fevers and leukocytosis  Lumbar puncture performed at [facility] consistent with meningitis, gram stain continues to be negative for organisms.   Continue to follow cultures  Continue to treat with empiric therapy with ceftriaxone, vancomycin and acyclovir  Continuing patient on Keppra 2500 mg intravenously twice daily as well as intravenous Vimpat  Intravenous Ativan as needed for episodes of breakthrough seizure activity or agitation  Seizure precautions  I have personally discussed the case with Dr. Thomasena Edis who will see patient shortly, patient will likely be initiated on LTM EEG monitoring

## 2020-11-28 NOTE — Progress Notes (Signed)
[facility] will assume care on arrival to accepting facility. Until arrival, care as per EDP. However, [facility] available 24/7 for questions and assistance.  Nursing staff please page [facility] Admits and Consults ([phone]) as soon as the patient arrives the hospital.   

## 2020-11-28 NOTE — ED Notes (Signed)
Pt given apple juice  

## 2020-11-28 NOTE — ED Notes (Signed)
Have lost all 3 iv's, Attempted 4 more times to start another IV and without success. MD notified and he states he would attempt to place one in as well

## 2020-11-28 NOTE — ED Notes (Signed)
Md notifed of Pt tempt and PRN suppository given. Also asked MD to look over medications and admin of same

## 2020-11-28 NOTE — ED Notes (Signed)
Pt complains of a headache
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
## 2020-11-28 NOTE — ED Notes (Signed)
Spoke with Dorene Sorrow from [facility] at 11:28am, he'll get the on-call physician to give call back.

## 2020-11-28 NOTE — ED Notes (Signed)
ED Provider at bedside. 

## 2020-11-28 NOTE — Assessment & Plan Note (Signed)
   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 Thomasena Edis with neurology who likely will initiate long-term EEG monitoring  As needed dosing of intravenous Ativan for bouts of agitation and seizures.

## 2020-11-28 NOTE — ED Notes (Signed)
Pt moving around excessively. Leads keep coming off.

## 2020-11-28 NOTE — ED Notes (Signed)
Pt given water 

## 2020-11-28 NOTE — ED Provider Notes (Signed)
Emergency Department Provider Note   I have reviewed the triage vital signs and the nursing notes.   HISTORY  Chief Complaint Seizures   HPI Adam [name] is a 40 y.o. male with past medical history reviewed below including epilepsy on Keppra, Vimpat, Onfi presents emergency department by private vehicle with somnolence since yesterday.  In route to the emergency department, the wife reports the patient had a generalized tonic-clonic seizure lasting for around 2 minutes.  Patient did miss his evening seizure medication due to being very sleepy.  He had been complaining of headache.  Wife denied any head injury, fevers, chills.  Patient does not take other medications.  She denies any alcohol or drug use.   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  Family History  Problem Relation Age of Onset   Seizures Neg Hx     Social History Social History   Tobacco Use   Smoking status: Never   Smokeless tobacco: Never  Vaping Use   Vaping Use: Never used  Substance Use Topics   Alcohol use: Yes    Comment: Ocassional and Socially   Drug use: Yes    Types: Marijuana    Comment: daily use    Review of Systems  Level 5 caveat: Post-ictal state   ____________________________________________   PHYSICAL EXAM:  VITAL SIGNS: Vitals:   11/28/20 0450  BP: (!) 157/72  Pulse: (!) 117  Resp: (!) 49  Temp: (!)  100.6 F (38.1 C)  SpO2: 100%    Constitutional: Somnolent with rapid respirations.  Eyes: Conjunctivae are normal. PERRL (4 mm).  Head: Atraumatic. Nose: No congestion/rhinnorhea. Mouth/Throat: Mucous membranes are slightly dry.  Neck: No stridor.   Cardiovascular: Normal rate, regular rhythm. Good peripheral circulation. Grossly normal heart sounds.   Respiratory: Normal respiratory effort.  No retractions. Lungs CTAB. Gastrointestinal: Soft and nontender. No distention.  Musculoskeletal: No lower extremity tenderness nor edema. No gross deformities of extremities. Neurologic: post-ictal state limits exam. No generalized tonic clonic activity. No repetitive movements to suspect partial seizure.  Skin:  Skin is warm, dry and intact. No rash noted.   ____________________________________________   LABS (all labs ordered are listed, but only abnormal results are displayed)  Labs Reviewed  ACETAMINOPHEN LEVEL - Abnormal; Notable for the following components:      Result Value   Acetaminophen (Tylenol), Serum <10 (*)    All other components within normal limits  COMPREHENSIVE METABOLIC PANEL - Abnormal; Notable for the following components:   Potassium 3.3 (*)    Chloride 95 (*)    CO2 12 (*)    Glucose, Bld 164 (*)    Total Protein 8.4 (*)    Anion gap 29 (*)    All other components within normal limits  SALICYLATE LEVEL - Abnormal; Notable for the following components:   Salicylate Lvl <7.0 (*)    All other components within normal limits  CBC WITH DIFFERENTIAL/PLATELET - Abnormal; Notable for the following components:   WBC 23.4 (*)    Neutro Abs 20.4 (*)    Monocytes Absolute 1.2 (*)    Abs Immature Granulocytes 0.14 (*)    All other components within normal limits  RESP PANEL BY RT-PCR (FLU A&B, COVID) ARPGX2  CULTURE, BLOOD (ROUTINE X 2)  CULTURE, BLOOD (ROUTINE X 2)  CSF CULTURE W GRAM STAIN  GRAM STAIN  CULTURE, BLOOD (SINGLE)  ETHANOL  LIPASE, BLOOD  RAPID  URINE DRUG SCREEN, HOSP PERFORMED  LEVETIRACETAM LEVEL  CSF CELL COUNT WITH DIFFERENTIAL  CSF CELL COUNT WITH DIFFERENTIAL  GLUCOSE, CSF  PROTEIN, CSF  HSV 1/2 PCR, CSF   ____________________________________________  EKG  None  ____________________________________________  RADIOLOGY  CT Head Wo Contrast  Result Date: 11/28/2020 CLINICAL DATA:  40 year old male with headache yesterday, lethargy, seizure. EXAM: CT HEAD WITHOUT CONTRAST TECHNIQUE: Contiguous axial images were obtained from the base of the skull through the vertex without intravenous contrast. COMPARISON:  Head CT 01/20/2018. FINDINGS: Brain: Chronic encephalomalacia in the bilateral inferior frontal gyri is stable since 2019 and more extensive on the right. Mild ex vacuo enlargement of the frontal horns. Cerebral volume elsewhere is stable, within normal limits. No midline shift, ventriculomegaly, mass effect, evidence of mass lesion, intracranial hemorrhage or evidence of cortically based acute infarction. Outside of the inferior frontal lobes gray-white matter differentiation is stable and within normal limits. Vascular: No suspicious intracranial vascular hyperdensity. Skull: Fairly extensive previous bilateral craniotomy changes including bilateral frontotemporal and midline frontal osteotomies. Postoperative changes affect previous frontal bone/frontal sinus cranioplasty. No acute osseous abnormality identified. Sinuses/Orbits: Previous bilateral paranasal sinus disease with generalized mucoperiosteal thickening. Subtotal left maxillary sinus opacification. Some bubbly opacity. Tympanic cavities and mastoids remain clear. Other: No acute orbit or scalp soft tissue finding. IMPRESSION: 1. No acute intracranial abnormality. 2. Extensive remote bilateral frontal craniotomy/cranioplasty with chronic bilateral inferior frontal lobe encephalomalacia. 3. 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 Electronically Signed   By: Odessa Fleming M.D.   On: 11/28/2020 05:44    ____________________________________________   PROCEDURES  Procedure(s) performed:   .Lumbar Puncture  Date/Time: 11/28/2020 6:20 AM Performed by: Maia Plan, MD Authorized by: Maia Plan, MD   Consent:    Consent obtained:  Written   Consent given by:  Spouse   Risks, benefits, and alternatives were discussed: yes     Risks discussed:  Bleeding, headache, nerve damage, infection, pain and repeat procedure   Alternatives discussed:  Delayed treatment Universal protocol:    Patient identity confirmed:  Arm band Pre-procedure details:    Procedure purpose:  Diagnostic   Preparation: Patient was prepped and draped in usual sterile fashion   Sedation:    Sedation type:  None Anesthesia:    Anesthesia method:  Local infiltration   Local anesthetic:  Lidocaine 1% w/o epi Procedure details:    Lumbar space:  L3-L4 interspace   Patient position:  R lateral decubitus   Needle gauge:  20   Needle type:  Spinal needle - Quincke tip   Needle length (in):  3.5   Ultrasound guidance: no     Number of attempts:  1   Fluid appearance:  Cloudy   Tubes of fluid:  4   Total volume (ml):  6 Post-procedure details:    Puncture site:  Adhesive bandage applied and direct pressure applied   Procedure completion:  Tolerated well, no immediate complications .Critical  Care Performed by: Maia Plan, MD Authorized by: Maia Plan, MD   Critical care provider statement:    Critical care time (minutes):  45   Critical care time was exclusive of:  Separately billable procedures and treating other patients and teaching time   Critical care was necessary to treat or prevent imminent or life-threatening deterioration of the following conditions:  CNS failure or compromise   Critical care was time spent personally by me on the following activities:  Blood draw for specimens, development of treatment plan  with patient or surrogate, discussions with consultants, evaluation of patient's response to treatment, examination of patient, obtaining history from patient or surrogate, ordering and performing treatments and interventions, ordering and review of laboratory studies, ordering and review of radiographic studies, pulse oximetry, re-evaluation of patient's condition and review of old charts   I assumed direction of critical care for this patient from another provider in my specialty: no     Care discussed with: admitting provider     ____________________________________________   INITIAL IMPRESSION / ASSESSMENT AND PLAN / ED COURSE  Pertinent labs & imaging results that were available during my care of the patient were reviewed by me and considered in my medical decision making (see chart for details).   Patient presents emergency department with breakthrough seizure.  He arrives postictal.  Did miss his evening dose of meds.  Wife describes somnolent since yesterday but no obvious seizure activity until driving into the emergency department this evening.  Patient appears postictal but is managing his airway.  We will give initial IV Ativan dose here.  We will send labs and obtain CT imaging of the head.   05:18 AM  Patient is in CT scan now. Went back to discuss more with the patient's wife.  He is febrile here to 100.6 F. No other clear infection source. Discussed treating immediately with steroid, abx, and antivirals. Will perform LP here. Obtained verbal consent from the patient's wife after risk/benefit discussion.   06:20 AM  CT imaging of the head with no acute findings.  Patient's lab work coming back concerning for infection with antibiotics, steroid, antivirals already started.  Patient has been given additional Ativan and will load with Keppra as well.  LP performed.  Patient tolerated the procedure well.  The spinal fluid is cloudy.  Will discuss with neurology as well as hospitalist for  admission. COVID negative.   Discussed case with Dr. Thomasena Edis with Neurology. Plan for Keppra 2500 mg IV BID and will continue Vimpat 300 mg BID dosing. Patient remains confused but is clinically improving. Able to tell me his name and follow basic commands.   06:30 AM  Discussed patient's case with [facility], Dr. Loney Loh to request admission. Patient and family (if present) updated with plan.   I reviewed all nursing notes, vitals, pertinent old records, EKGs, labs, imaging (as available).  ____________________________________________  FINAL CLINICAL IMPRESSION(S) / ED DIAGNOSES  Final diagnoses:  Breakthrough seizure (HCC)  Fever, unspecified fever cause     MEDICATIONS GIVEN DURING THIS VISIT:  Medications  acetaminophen (TYLENOL) suppository 650 mg (has no administration in time range)  lidocaine (PF) (XYLOCAINE) 1 % injection 10 mL (has no administration in time range)  lactated ringers infusion (has no administration in time range)  acyclovir (ZOVIRAX) 660 mg in dextrose 5 % 100 mL IVPB (has no administration in time range)  levETIRAcetam (KEPPRA) IVPB 1000 mg/100 mL premix (1,000 mg Intravenous New Bag/Given 11/28/20 0557)  And  levETIRAcetam (KEPPRA) IVPB 500 mg/100 mL premix (has no administration in time range)  vancomycin (VANCOCIN) IVPB 1000 mg/200 mL premix (has no administration in time range)  LORazepam (ATIVAN) injection 1 mg (has no administration in time range)  acyclovir (ZOVIRAX) 660 mg in dextrose 5 % 100 mL IVPB (has no administration in time range)  vancomycin (VANCOREADY) IVPB 750 mg/150 mL (has no administration in time range)  LORazepam (ATIVAN) injection 1 mg (1 mg Intravenous Given 11/28/20 0452)  sodium chloride 0.9 % bolus 1,000 mL (1,000 mLs Intravenous New Bag/Given 11/28/20 0524)  dexamethasone (DECADRON) injection 10 mg (10 mg Intravenous Given 11/28/20 0559)  cefTRIAXone (ROCEPHIN) 2 g in sodium chloride 0.9 % 100 mL IVPB (2 g Intravenous New  Bag/Given 11/28/20 0547)     Note:  This document was prepared using Dragon voice recognition software and may include unintentional dictation errors.  Alona Bene, MD, [facility] Emergency Medicine    Cadance Raus, Arlyss Repress, MD 11/28/20 [phone]

## 2020-11-28 NOTE — ED Notes (Signed)
Accidentally documented "restraints non-violent completed."

## 2020-11-28 NOTE — ED Notes (Addendum)
Pt is stating, "My head is hurting." Joe - RN informed.

## 2020-11-28 NOTE — ED Notes (Signed)
Acyclovir given at 1341 along with the 100 ml d-5 is currently running and given. Spoke with Pharm on phone to correct admin times

## 2020-11-28 NOTE — ED Notes (Signed)
Pt once again has taken off his leads and has peed around his brief. Pt is not making much sense with words and is again very restless.

## 2020-11-28 NOTE — ED Notes (Signed)
Restraints removed. Pt lucid and answering questions and responding to commands. Pt disposition cooperative

## 2020-11-28 NOTE — ED Notes (Signed)
Pt's Family came out stating Pt had urinated all over himself and the bed. Myself and Tech Kenney Houseman went in to change Pt. Pt is disoriented again. Pt does not understand simple commands at this time again. Myself & Kenney Houseman had to turn pt b/c he would not turn himself. Pt still pulling at lead wires as well as blankets and his gown. Pt not making full sentences and is hard to understand. Pt placed in an Adult Brief and laying on his side. Pt is very restless.

## 2020-11-28 NOTE — ED Provider Notes (Signed)
Brief update note  See Dr. Edwin Dada note for full EDP documentaiton.   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 Started on keppra, vimpat, ceftriaxone, vanc, acyclovir, decadron, fluids. No further seizures. Admitted to [facility] at [facility], consulted neuro. Patient had some increased agitation. Given dose of ativan. Discussed case with Baldo Daub, pt primary neuro at [facility], who wanted to make sure we were aware of his prior history of nonconvulsive status. He recommends neuro eval and LTM. Though patient has some intermittent confusion and agitation he is not currently having any frank seizure activity. I discussed the case with Thomasena Edis who is on tonight for neuro at [facility]. He was already aware of patient and will see him when he gets to [facility] and can make arrangements for EEG. Vitals stable at present. Leaving with [facility] now.    Milagros Loll, MD 11/28/20 [phone]

## 2020-11-28 NOTE — ED Triage Notes (Signed)
Pt came in with c/o seizure. According to pt's wife he was asleep all day yesterday. He had a bad h/a yesterday and his neurologist advised to take Gabapentin. Pt's wife brought him to the hospital because he was so sleepy and lethargic last night. On his way to the hospital he had a seizure the whole way. Pt is postictal. RR 53.  Pt was unable to take his normal seizure meds yesterday because of how sleepy he was.

## 2020-11-28 NOTE — ED Notes (Signed)
Restraints placed

## 2020-11-28 NOTE — ED Notes (Signed)
Iv flushed well upon checking and seemed to do well with admin of ativan. Ativan was given with 10 mL flush. However it appears to have infiltrated on the push after giving ativan.

## 2020-11-28 NOTE — ED Notes (Signed)
Dr. Stevie Kern looked over Pt's Meds and concurred with current status and that the pt needs is what iscurrently showing in the [facility] (which is not in our Pharm,see previous blank note)

## 2020-11-28 NOTE — ED Notes (Signed)
Pt is able to give name and DOB but is still confused and restless. Ativan given but does not seem to be to effective.  2 out of 3 IV's have been lost due to Pt moving  and trashing in bed. 1 to 1 sitter in -place MD notified. Restraints ordered but holding off for a few and see if we can get Pt to lay still enough to complete care

## 2020-11-28 NOTE — H&P (Addendum)
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  Patient is an extremely poor historian due to significant lethargy and confusion.  Majority of the history is been obtained from speaking to the patient's wife as well as review of emergency department provider notes.  According to the wife, the patient has been exhibiting progressively worsening lethargy for approximately 24 hours.  Patient has additionally been complaining of associated generalized severe headaches that seem to have been unrelenting over the same period of time.  Patient has been compliant with his antiepileptic regimen.  Patient has not undergone any recent travel, been exposed any sick contacts or has any contact with known COVID-19 infection as of late.  Due to progressively worsening symptoms of headache and lethargy while decided to bring the patient [facility].  Patient began to exhibit tonic-clonic seizure activity.  Upon evaluation in the emergency department, upon initial evaluation patient was found to exhibit multiple surgical Teare including fever of 100.6 F and tachycardia in the 110's with substantial leukocytosis.  In the absence of any other source there was immediate concern for possible meningitis.  Lumbar puncture was quickly performed with initial cell count findings found to be consistent with meningitis.  Patient was  empirically initiated on intravenous ceftriaxone and vancomycin.  Case was discussed with Dr. Thomasena Edis with neurology who recommended intravenous Keppra  and Vmpat.  Patient's been accepted by the hospital service for transfer to [facility] for continued medical care.   Review of Systems:   Review of Systems  Unable to perform ROS: Mental status change   Past Medical History:  Diagnosis Date   Marijuana smoker, continuous 01/10/2018   Seizures (HCC)     Past Surgical History:  Procedure Laterality Date   NASAL SINUS SURGERY  2002   VAGUS NERVE STIMULATOR INSERTION Left 2021     reports that he has never smoked. He has never used smokeless tobacco. He reports current alcohol use. He reports current drug use. Drug: Marijuana.  No Known Allergies  Family History  Problem Relation Age of Onset   Seizures Neg Hx      Prior to Admission medications   Medication Sig Start Date End Date Taking? Authorizing Provider  cloBAZam (ONFI) 10 MG tablet Take 1 tablet (10 mg total) by mouth 2 (two) times daily. 11/18/18   Danford, Earl Lites, MD  Lacosamide (VIMPAT) 100 MG TABS Take 3 tablets (300 mg total) by mouth 2 (two) times daily. 11/18/18 12/18/18  Alberteen Sam, MD  levETIRAcetam (KEPPRA) 1000 MG tablet Take 2,500 mg by mouth 2 (two) times daily. 10/27/18   [provider]  LORazepam (ATIVAN) 2 MG/ML injection Inject 1 mL (2 mg total) into the muscle once as needed for up to 1 dose for seizure. 09/18/20   Koleen Distance, MD    Physical Exam: Vitals:   11/28/20 1813 11/28/20 1826 11/28/20 1900 11/28/20 2021  BP:  118/84 116/70 125/76  Pulse:  88 91 (!) 107  Resp:  (!) 23  (!) 23  Temp: 99.4 F (37.4 C)   99.6 F (37.6 C)  TempSrc: Oral   Axillary  SpO2:  100% 97%   Weight:      Height:        Constitutional: Lethargic but arousable with bouts of agitation, oriented x2  Skin: no rashes, no lesions, good skin turgor noted. Eyes: Pupils are equally  reactive to light.  No evidence of scleral icterus or conjunctival pallor.  ENMT: Moist mucous membranes noted.  Posterior pharynx clear of any exudate or lesions.   Neck: normal, supple, no masses, no thyromegaly.  No evidence of jugular venous distension.   Respiratory: clear to auscultation bilaterally, no wheezing, no crackles. Normal respiratory effort. No accessory muscle use.  Cardiovascular: Regular rate and rhythm, no murmurs / rubs / gallops. No extremity edema. 2+ pedal pulses. No carotid bruits.  Chest:   Nontender without crepitus or deformity.   Back:   Nontender without crepitus or deformity. Abdomen: Abdomen is soft and nontender.  No evidence of intra-abdominal masses.  Positive bowel sounds noted in all quadrants.   Musculoskeletal: No joint deformity upper and lower extremities. Good ROM, no contractures. Normal muscle tone.  Neurologic: Lethargic but arousable.  Agitation throughout the interview.  Orientation waxes and wanes.  Patient not following commands consistently.  Patient moving all 4 extremities spontaneously.  Sensation is grossly intact.   Psychiatric: Unable to assess due to lethargy with bouts of extreme agitation.  Patient currently does not seem to possess insight as to his current situation.     Labs on Admission: I have personally reviewed following labs and imaging studies -   CBC: Recent Labs  Lab 11/28/20 0449  WBC 23.4*  NEUTROABS 20.4*  HGB 13.5  HCT 42.0  MCV 92.5  PLT 265   Basic Metabolic Panel: Recent Labs  Lab 11/28/20 0449  NA 136  K 3.3*  CL 95*  CO2 12*  GLUCOSE 164*  BUN 10  CREATININE 1.24  CALCIUM 10.2   GFR: Estimated Creatinine Clearance: 75.2 mL/min (by C-G formula based on SCr of 1.24 mg/dL). Liver Function Tests: Recent Labs  Lab 11/28/20 0449  AST 30  ALT 26  ALKPHOS 78  BILITOT 0.5  PROT 8.4*  ALBUMIN 4.8   Recent Labs  Lab 11/28/20 0449  LIPASE 20   No results for input(s): AMMONIA in the last 168  hours. Coagulation Profile: No results for input(s): INR, PROTIME in the last 168 hours. Cardiac Enzymes: No results for input(s): CKTOTAL, CKMB, CKMBINDEX, TROPONINI in the last 168 hours. BNP (last 3 results) No results for input(s): PROBNP in the last 8760 hours. HbA1C: No results for input(s): HGBA1C in the last 72 hours. CBG: No results for input(s): GLUCAP in the last 168 hours. Lipid Profile: No results for input(s): CHOL, HDL, LDLCALC, TRIG, CHOLHDL, LDLDIRECT in the last 72 hours. Thyroid Function Tests: No results for input(s): TSH, T4TOTAL, FREET4, T3FREE, THYROIDAB in the last 72 hours. Anemia Panel: No results for input(s): VITAMINB12, FOLATE, FERRITIN, TIBC, IRON, RETICCTPCT in the last 72 hours. Urine analysis:    Component Value Date/Time   COLORURINE STRAW (A) 11/16/2018 1113   APPEARANCEUR CLEAR 11/16/2018 1113   LABSPEC 1.014 11/16/2018 1113   PHURINE 6.0 11/16/2018 1113   GLUCOSEU 150 (A) 11/16/2018 1113   HGBUR SMALL (A) 11/16/2018 1113   BILIRUBINUR NEGATIVE 11/16/2018 1113   KETONESUR NEGATIVE 11/16/2018 1113  PROTEINUR NEGATIVE 11/16/2018 1113   NITRITE NEGATIVE 11/16/2018 1113   LEUKOCYTESUR NEGATIVE 11/16/2018 1113    Radiological Exams on Admission - Personally Reviewed: CT Head Wo Contrast  Result Date: 11/28/2020 CLINICAL DATA:  40 year old male with headache yesterday, lethargy, seizure. EXAM: CT HEAD WITHOUT CONTRAST TECHNIQUE: Contiguous axial images were obtained from the base of the skull through the vertex without intravenous contrast. COMPARISON:  Head CT 01/20/2018. FINDINGS: Brain: Chronic encephalomalacia in the bilateral inferior frontal gyri is stable since 2019 and more extensive on the right. Mild ex vacuo enlargement of the frontal horns. Cerebral volume elsewhere is stable, within normal limits. No midline shift, ventriculomegaly, mass effect, evidence of mass lesion, intracranial hemorrhage or evidence of cortically based acute  infarction. Outside of the inferior frontal lobes gray-white matter differentiation is stable and within normal limits. Vascular: No suspicious intracranial vascular hyperdensity. Skull: Fairly extensive previous bilateral craniotomy changes including bilateral frontotemporal and midline frontal osteotomies. Postoperative changes affect previous frontal bone/frontal sinus cranioplasty. No acute osseous abnormality identified. Sinuses/Orbits: Previous bilateral paranasal sinus disease with generalized mucoperiosteal thickening. Subtotal left maxillary sinus opacification. Some bubbly opacity. Tympanic cavities and mastoids remain clear. Other: No acute orbit or scalp soft tissue finding. IMPRESSION: 1. No acute intracranial abnormality. 2. Extensive remote bilateral frontal craniotomy/cranioplasty with chronic bilateral inferior frontal lobe encephalomalacia. 3. Previous bilateral paranasal sinus surgery with diffuse acute on chronic sinusitis. Electronically Signed   By: Odessa Fleming M.D.   On: 11/28/2020 05:44     Assessment/Plan  * Breakthrough seizure (HCC) Witnessed breakthrough seizure activity and patient with known history of intractable epilepsy status post vagus nerve stimulation 04/2019 as well as history of nonconvulsive status epilepticus in the past and follows with [facility] neurology Currently on intravenous Vimpat every 12 hours and Keppra 2500 mg twice daily.  Patient is additionally on twice daily dosing of Onfi in the outpatient setting predisposing patient to benzodiazepine tolerance Continue to monitor for episodes of seizure activity Seizure precautions Discussed case with Dr. Thomasena Edis with neurology who likely will initiate long-term EEG monitoring As needed dosing of intravenous Ativan for bouts of agitation and seizures.  Acute bacterial meningitis Patient presenting with 24-hour history of progressive lethargy and confusion with a witnessed episode of several minutes of  tonic-clonic seizure activity per the wife  On arrival, patient exhibiting multiple SIRS criteria including recurrent fevers and leukocytosis Lumbar puncture performed at [facility] consistent with meningitis, gram stain continues to be negative for organisms.  Continue to follow cultures Continue to treat with empiric therapy with ceftriaxone, vancomycin and acyclovir Continuing patient on Keppra 2500 mg intravenously twice daily as well as intravenous Vimpat Intravenous Ativan as needed for episodes of breakthrough seizure activity or agitation Seizure precautions I have personally discussed the case with Dr. Thomasena Edis who will see patient shortly, patient will likely be initiated on LTM EEG monitoring     Code Status:  Full code  code status decision has been confirmed with: wife at the bedside Family Communication: Wife is at the bedside and has been updated on plan of care.  Status is: Inpatient  Remains inpatient appropriate because: Continued need of intravenous treatment, close clinical monitoring via continuous EEG and specialty consultation with neurology.        Marinda Elk MD [facility] Pager [phone]  If 7PM-7AM, please contact night-coverage [email or web address] Use universal [place] password for that web site. If you do not have the password, please call the hospital operator.  11/28/2020, 10:18 PM

## 2020-11-28 NOTE — ED Notes (Signed)
Just now receiving curried medications of this Pt from Pharm tech VIMPAT was one of those medications just now received and was due at 1000 this morning. Need to get clearance from MD to see if still need to give or with hold.

## 2020-11-29 ENCOUNTER — Inpatient Hospital Stay (HOSPITAL_COMMUNITY): Payer: Medicare Other

## 2020-11-29 DIAGNOSIS — G40803 Other epilepsy, intractable, with status epilepticus: Secondary | ICD-10-CM

## 2020-11-29 DIAGNOSIS — G40919 Epilepsy, unspecified, intractable, without status epilepticus: Secondary | ICD-10-CM

## 2020-11-29 DIAGNOSIS — G009 Bacterial meningitis, unspecified: Secondary | ICD-10-CM | POA: Diagnosis not present

## 2020-11-29 LAB — COMPREHENSIVE METABOLIC PANEL
ALT: 27 U/L (ref 0–44)
AST: 34 U/L (ref 15–41)
Albumin: 3.4 g/dL — ABNORMAL LOW (ref 3.5–5.0)
Alkaline Phosphatase: 68 U/L (ref 38–126)
Anion gap: 11 (ref 5–15)
BUN: 11 mg/dL (ref 6–20)
CO2: 23 mmol/L (ref 22–32)
Calcium: 8.9 mg/dL (ref 8.9–10.3)
Chloride: 103 mmol/L (ref 98–111)
Creatinine, Ser: 1.03 mg/dL (ref 0.61–1.24)
GFR, Estimated: >60 mL/min (ref >60)
Glucose, Bld: 105 mg/dL — ABNORMAL HIGH (ref 70–99)
Potassium: 3.6 mmol/L (ref 3.5–5.1)
Sodium: 137 mmol/L (ref 135–145)
Total Bilirubin: 0.5 mg/dL (ref 0.3–1.2)
Total Protein: 7.5 g/dL (ref 6.5–8.1)

## 2020-11-29 LAB — RAPID URINE DRUG SCREEN, HOSP PERFORMED
Amphetamines: NOT DETECTED
Barbiturates: NOT DETECTED
Benzodiazepines: POSITIVE — AB
Cocaine: NOT DETECTED
Opiates: NOT DETECTED
Tetrahydrocannabinol: POSITIVE — AB

## 2020-11-29 LAB — MAGNESIUM: Magnesium: 2 mg/dL (ref 1.7–2.4)

## 2020-11-29 LAB — CBC WITH DIFFERENTIAL/PLATELET
Abs Immature Granulocytes: 0.27 10*3/uL — ABNORMAL HIGH (ref 0.00–0.07)
Basophils Absolute: 0 10*3/uL (ref 0.0–0.1)
Basophils Relative: 0 %
Eosinophils Absolute: 0 10*3/uL (ref 0.0–0.5)
Eosinophils Relative: 0 %
HCT: 38 % — ABNORMAL LOW (ref 39.0–52.0)
Hemoglobin: 13 g/dL (ref 13.0–17.0)
Immature Granulocytes: 1 %
Lymphocytes Relative: 6 %
Lymphs Abs: 1.5 10*3/uL (ref 0.7–4.0)
MCH: 29.8 pg (ref 26.0–34.0)
MCHC: 34.2 g/dL (ref 30.0–36.0)
MCV: 87.2 fL (ref 80.0–100.0)
Monocytes Absolute: 1.9 10*3/uL — ABNORMAL HIGH (ref 0.1–1.0)
Monocytes Relative: 7 %
Neutro Abs: 24.1 10*3/uL — ABNORMAL HIGH (ref 1.7–7.7)
Neutrophils Relative %: 86 %
Platelets: 216 10*3/uL (ref 150–400)
RBC: 4.36 MIL/uL (ref 4.22–5.81)
RDW: 12.9 % (ref 11.5–15.5)
WBC: 27.8 10*3/uL — ABNORMAL HIGH (ref 4.0–10.5)
nRBC: 0 % (ref 0.0–0.2)

## 2020-11-29 LAB — HSV 1/2 PCR, CSF
HSV-1 DNA: NEGATIVE
HSV-2 DNA: NEGATIVE

## 2020-11-29 LAB — LEVETIRACETAM LEVEL: Levetiracetam Lvl: 14 ug/mL (ref 10.0–40.0)

## 2020-11-29 LAB — HIV ANTIBODY (ROUTINE TESTING W REFLEX): HIV Screen 4th Generation wRfx: NONREACTIVE

## 2020-11-29 MED ORDER — HALOPERIDOL LACTATE 5 MG/ML IJ SOLN: 1.0000 mg | Freq: Four times a day (QID) | INTRAMUSCULAR | Status: DC | PRN

## 2020-11-29 MED ORDER — SODIUM CHLORIDE 0.9 % IV SOLN: INTRAVENOUS | Status: DC

## 2020-11-29 MED ORDER — LORAZEPAM 2 MG/ML IJ SOLN
4.0000 mg | INTRAMUSCULAR | Status: DC | PRN
  Administered 2020-11-29 (×2): 4 mg via INTRAVENOUS
  Filled 2020-11-29: qty 2

## 2020-11-29 MED ORDER — LORAZEPAM 2 MG/ML IJ SOLN: 1.0000 mg | INTRAMUSCULAR | Status: DC | PRN

## 2020-11-29 MED ORDER — VANCOMYCIN HCL 750 MG/150ML IV SOLN
750.0000 mg | Freq: Three times a day (TID) | INTRAVENOUS | Status: DC
Start: 2020-11-29 — End: 2020-12-01
  Administered 2020-11-29 – 2020-12-01 (×5): 750 mg via INTRAVENOUS
  Filled 2020-11-29 (×7): qty 150

## 2020-11-29 MED ORDER — MELATONIN 3 MG PO TABS
3.0000 mg | ORAL_TABLET | Freq: Every evening | ORAL | Status: DC | PRN
  Administered 2020-11-29 – 2020-11-30 (×2): 3 mg via ORAL
  Filled 2020-11-29 (×2): qty 1

## 2020-11-29 MED ORDER — LORAZEPAM 2 MG/ML IJ SOLN
INTRAMUSCULAR | Status: AC
  Filled 2020-11-29: qty 2

## 2020-11-29 NOTE — Progress Notes (Signed)
[facility] PROGRESS NOTE    Progress Note  Adam [name]  NIO:[id number] DOB: 1980/03/08 DOA: 11/28/2020 PCP: Patient, No Pcp Per (Inactive)     Brief Narrative:   Adam [name] is an 40 y.o. male past medical history of intractable epilepsy, status post vagus nerve stimulation on March 2021, history of nonconvulsive status epilepticus, cannabis use, depression who [facility] for 1 day of progressive worsening headache lethargy and seizure activity reported by the patient's spouse.  As per the wife the patient has been compliant with his antiepileptic drug has not undergone any recent travel exposures or changes in her medication.  Due to her progressive weakness she decided to bring him to the ED where he exhibited tonic-clonic movement activity.  Patient was initially started empirically on Rocephin and vancomycin the case case was discussed with neurology recommended loading her up with IV Keppra and Vimpat.  CT of the head showed no acute cranial abnormalitie but it did showed extensive remote bilateral frontal craniotomy with chronic bilateral inferior frontal lobe encephalomalacia.   Assessment/Plan:   Breakthrough seizure (HCC) Breakthrough seizure in the ED with a known history of intractable epilepsy. She was loaded with Vimpat and Keppra. Neurology was consulted who recommended 24-hour EEG. Continue to use Ativan as needed for agitation or seizures. Empirically on IV vancomycin and cefepime and acyclovir.  LP was xanthrochromic. Appreciate neurology's assistance. Haldol for agitation.  Acute bacterial meningitis Arrival to the ED she was experiencing's SIRS criteria with fevers and leukocytosis. Lumbar puncture performed at [facility] concerning for meningitis. Gram stain remains negative, cultures has been sent. Continue IV Rocephin and vancomycin, acyclovir was added.    DVT prophylaxis: lovenox Family  Communication:none Status is: Inpatient  Remains inpatient appropriate because: Acute severity of illness dictated the patient is inpatient and there is a concern for acute meningitis        Code Status:     Code Status Orders  (From admission, onward)           Start     Ordered   11/28/20 2130  Full code  Continuous        11/28/20 2130           Code Status History     Date Active Date Inactive Code Status Order ID Comments User Context   11/16/2018 1208 11/18/2018 2051 Full Code [id number]  Jonah Blue, MD ED         IV Access:   Peripheral IV   Procedures and diagnostic studies:   CT Head Wo Contrast  Result Date: 11/28/2020 CLINICAL DATA:  40 year old male with headache yesterday, lethargy, seizure. EXAM: CT HEAD WITHOUT CONTRAST TECHNIQUE: Contiguous axial images were obtained from the base of the skull through the vertex without intravenous contrast. COMPARISON:  Head CT 01/20/2018. FINDINGS: Brain: Chronic encephalomalacia in the bilateral inferior frontal gyri is stable since 2019 and more extensive on the right. Mild ex vacuo enlargement of the frontal horns. Cerebral volume elsewhere is stable, within normal limits. No midline shift, ventriculomegaly, mass effect, evidence of mass lesion, intracranial hemorrhage or evidence of cortically based acute infarction. Outside of the inferior frontal lobes gray-white matter differentiation is stable and within normal limits. Vascular: No suspicious intracranial vascular hyperdensity. Skull: Fairly extensive previous bilateral craniotomy changes including bilateral frontotemporal and midline frontal osteotomies. Postoperative changes affect previous frontal bone/frontal sinus cranioplasty. No acute osseous abnormality identified. Sinuses/Orbits: Previous bilateral paranasal sinus disease with generalized mucoperiosteal thickening. Subtotal  left maxillary sinus opacification. Some bubbly opacity. Tympanic cavities  and mastoids remain clear. Other: No acute orbit or scalp soft tissue finding. IMPRESSION: 1. No acute intracranial abnormality. 2. Extensive remote bilateral frontal craniotomy/cranioplasty with chronic bilateral inferior frontal lobe encephalomalacia. 3. Previous bilateral paranasal sinus surgery with diffuse acute on chronic sinusitis. Electronically Signed   By: Odessa Fleming M.D.   On: 11/28/2020 05:44     Medical Consultants:   None.   Subjective:    Adam [name] nonverbal this morning sleepy he received Ativan at 4 AM.  Objective:    Vitals:   11/28/20 1900 11/28/20 2021 11/29/20 0059 11/29/20 0536  BP: 116/70 125/76 (!) 126/92 128/69  Pulse: 91 (!) 107 (!) 105 91  Resp:  (!) 23 (!) 22 (!) 23  Temp:  99.6 F (37.6 C) 99.2 F (37.3 C) 99.4 F (37.4 C)  TempSrc:  Axillary Axillary Axillary  SpO2: 97%  100% 100%  Weight:      Height:       SpO2: 100 %   Intake/Output Summary (Last 24 hours) at 11/29/2020 0904 Last data filed at 11/28/2020 1509 Gross per 24 hour  Intake 203.77 ml  Output 1350 ml  Net -1146.23 ml   Filed Weights   11/28/20 0451  Weight: 65.8 kg    Exam: General exam: In no acute distress. Respiratory system: Good air movement and clear to auscultation. Cardiovascular system: S1 & S2 heard, RRR. No JVD.  Gastrointestinal system: Abdomen is nondistended, soft and nontender.  Central nervous system: Lethargic this morning. Extremities: No pedal edema. Skin: No rashes, lesions or ulcers   Data Reviewed:    Labs: Basic Metabolic Panel: Recent Labs  Lab 11/28/20 0449 11/29/20 0526  NA 136 137  K 3.3* 3.6  CL 95* 103  CO2 12* 23  GLUCOSE 164* 105*  BUN 10 11  CREATININE 1.24 1.03  CALCIUM 10.2 8.9  MG  --  2.0   GFR Estimated Creatinine Clearance: 90.5 mL/min (by C-G formula based on SCr of 1.03 mg/dL). Liver Function Tests: Recent Labs  Lab 11/28/20 0449 11/29/20 0526  AST 30 34  ALT 26 27  ALKPHOS 78 68  BILITOT 0.5 0.5   PROT 8.4* 7.5  ALBUMIN 4.8 3.4*   Recent Labs  Lab 11/28/20 0449  LIPASE 20   No results for input(s): AMMONIA in the last 168 hours. Coagulation profile No results for input(s): INR, PROTIME in the last 168 hours. COVID-19 Labs  No results for input(s): DDIMER, FERRITIN, LDH, CRP in the last 72 hours.  Lab Results  Component Value Date   SARSCOV2NAA NEGATIVE 11/28/2020   SARSCOV2NAA NEGATIVE 10/22/2019   SARSCOV2NAA NEGATIVE 11/16/2018    CBC: Recent Labs  Lab 11/28/20 0449 11/29/20 0526  WBC 23.4* 27.8*  NEUTROABS 20.4* PENDING  HGB 13.5 13.0  HCT 42.0 38.0*  MCV 92.5 87.2  PLT 265 216   Cardiac Enzymes: No results for input(s): CKTOTAL, CKMB, CKMBINDEX, TROPONINI in the last 168 hours. BNP (last 3 results) No results for input(s): PROBNP in the last 8760 hours. CBG: No results for input(s): GLUCAP in the last 168 hours. D-Dimer: No results for input(s): DDIMER in the last 72 hours. Hgb A1c: No results for input(s): HGBA1C in the last 72 hours. Lipid Profile: No results for input(s): CHOL, HDL, LDLCALC, TRIG, CHOLHDL, LDLDIRECT in the last 72 hours. Thyroid function studies: No results for input(s): TSH, T4TOTAL, T3FREE, THYROIDAB in the last 72 hours.  Invalid input(s): FREET3 Anemia  work up: No results for input(s): VITAMINB12, FOLATE, FERRITIN, TIBC, IRON, RETICCTPCT in the last 72 hours. Sepsis Labs: Recent Labs  Lab 11/28/20 0449 11/29/20 0526  WBC 23.4* 27.8*   Microbiology Recent Results (from the past 240 hour(s))  Resp Panel by RT-PCR (Flu A&B, Covid) Nasopharyngeal Swab     Status: None   Collection Time: 11/28/20  4:47 AM   Specimen: Nasopharyngeal Swab; Nasopharyngeal(NP) swabs in vial transport medium  Result Value Ref Range Status   SARS Coronavirus 2 by RT PCR NEGATIVE NEGATIVE Final    Comment: (NOTE) SARS-CoV-2 target nucleic acids are NOT DETECTED.  The SARS-CoV-2 RNA is generally detectable in upper respiratory specimens  during the acute phase of infection. The lowest concentration of SARS-CoV-2 viral copies this assay can detect is 138 copies/mL. A negative result does not preclude SARS-Cov-2 infection and should not be used as the sole basis for treatment or other patient management decisions. A negative result may occur with  improper specimen collection/handling, submission of specimen other than nasopharyngeal swab, presence of viral mutation(s) within the areas targeted by this assay, and inadequate number of viral copies(<138 copies/mL). A negative result must be combined with clinical observations, patient history, and epidemiological information. The expected result is Negative.  Fact Sheet for Patients:  BloggerCourse.com  Fact Sheet for Healthcare Providers:  SeriousBroker.it  This test is no t yet approved or cleared by the [place] FDA and  has been authorized for detection and/or diagnosis of SARS-CoV-2 by FDA under an Emergency Use Authorization (EUA). This EUA will remain  in effect (meaning this test can be used) for the duration of the COVID-19 declaration under Section 564(b)(1) of the Act, 21 U.S.C.section 360bbb-3(b)(1), unless the authorization is terminated  or revoked sooner.       Influenza A by PCR NEGATIVE NEGATIVE Final   Influenza B by PCR NEGATIVE NEGATIVE Final    Comment: (NOTE) The Xpert Xpress SARS-CoV-2/FLU/RSV plus assay is intended as an aid in the diagnosis of influenza from Nasopharyngeal swab specimens and should not be used as a sole basis for treatment. Nasal washings and aspirates are unacceptable for Xpert Xpress SARS-CoV-2/FLU/RSV testing.  Fact Sheet for Patients: BloggerCourse.com  Fact Sheet for Healthcare Providers: SeriousBroker.it  This test is not yet approved or cleared by the [place] FDA and has been authorized for detection  and/or diagnosis of SARS-CoV-2 by FDA under an Emergency Use Authorization (EUA). This EUA will remain in effect (meaning this test can be used) for the duration of the COVID-19 declaration under Section 564(b)(1) of the Act, 21 U.S.C. section 360bbb-3(b)(1), unless the authorization is terminated or revoked.  Performed at [facility], [address], [place], [place] [address]   CSF culture     Status: None (Preliminary result)   Collection Time: 11/28/20  6:21 AM   Specimen: CSF; Cerebrospinal Fluid  Result Value Ref Range Status   Specimen Description CSF  Final   Special Requests BACK  Final   Gram Stain   Final    WBC PRESENT, PREDOMINANTLY PMN NO ORGANISMS SEEN CYTOSPIN SMEAR    Culture   Final    NO GROWTH 1 DAY Performed at [facility], [address], [place], [place] [address]    Report Status PENDING  Incomplete     Medications:    enoxaparin (LOVENOX) injection  40 mg Subcutaneous Q24H   lidocaine (PF)  10 mL Infiltration Once   LORazepam  Continuous Infusions:  acyclovir 660 mg (11/29/20 0519)   cefTRIAXone (ROCEPHIN)  IV Stopped (11/29/20 0010)   lacosamide (VIMPAT) IV 300 mg (11/29/20 0007)   levETIRAcetam 2,500 mg (11/29/20 0112)   vancomycin 750 mg (11/29/20 0254)      LOS: 1 day   Marinda Elk  [facility]  11/29/2020, 9:04 AM

## 2020-11-29 NOTE — Plan of Care (Signed)
Neurology plan of care given patient's consult was done after MN last pm.   Patient is very sleepy, as he received a large dose of Ativan around 0500 hours. He will open his eyes and protrude his tongue on command. Follows no other commands. Can move RLE spontaneously as he crosses and uncrosses his legs. He grimaces to noxious stimuli in BUEs, but no reaction to BLEs with noxious stimuli. No speech at present.   CSF with Xanthochromia, so concern for meningitis lead to Acyclovir IV. No nuchal rigidity on exam. He is non compliant with seizure medications at home, and this seizure was likely due to non compliance and/or infection. T max 99.4 F. WBCC 27.8. Due to major lethargy, decreased Ativan to 1mg .   MRI brain with and without contrast and EEG overnight pending but preliminary read showed no seizures or epileptiform discharges. .   Continue present plan and we will follow up tomorrow.   , MSN, APN-BC Neurology Nurse Practitioner Pager [phone]

## 2020-11-29 NOTE — Plan of Care (Signed)
Neurology plan of care given patient's consult was done after MN last pm.   Patient is very sleepy, as he received a large dose of Ativan around 0500 hours. He will open his eyes and protrude his tongue on command. Follows no other commands. Can move RLE spontaneously as he crosses and uncrosses his legs. He grimaces to noxious stimuli in BUEs, but no reaction to BLEs with noxious stimuli. No speech at present.   CSF with Xanthochromia, so concern for meningitis lead to Acyclovir IV. No nuchal rigidity on exam. He is non compliant with seizure medications at home, and this seizure was likely due to non compliance and/or infection. T max 99.4 F. WBCC 27.8. Due to major lethargy, decreased Ativan to 1mg .   Continue present plan and we will follow up tomorrow.   , MSN, APN-BC Neurology Nurse Practitioner Pager [phone]

## 2020-11-29 NOTE — Progress Notes (Signed)
Patient arrived in the unit at 2000 from [facility], [place], very restlessness, agitated, confuse. Pt is on the bed at this moment, will contact attending provider and go with that and  will continue to monitor closely.

## 2020-11-29 NOTE — Progress Notes (Signed)
Pharmacy Antibiotic Note  Adam [name] is a 40 y.o. male admitted on 11/28/2020 with possible meningitis.  Pharmacy has been consulted for Vancomycin and Acyclovir dosing.  Also on Ceftriaxone.     Creatinine 1.24 on admit, now 1.03.  LR at 125 ml/hr given 10/19 am thru ~0130am. IV fluids recommened while on IV Acyclovir to try to prevent nephrotoxicity. Per discussion with Dr. David Stall, to resume IV fluids at with NS at 125 ml/hr.  Plan: Vancomycin 750 mg IV q12h > to q8h. Target Vancomycin troughs 15-20 mcg/ml Will plan to check trough level at steady state. Acyclovir 660 mg (10 mg/kg) IV q8h. Also on Ceftriaxone 2 gm IV q12h Vancomycin and Acyclovir currently requested for 14 days. Follow renal function, culture data, clinical progress and antibiotic plans.  Height: 5\' 11"  (180.3 cm) Weight: 65.8 kg (145 lb) IBW/kg (Calculated) : 75.3  Temp (24hrs), Avg:100 F (37.8 C), Min:99.2 F (37.3 C), Max:102.4 F (39.1 C)  Recent Labs  Lab 11/28/20 0449 11/29/20 0526  WBC 23.4* 27.8*  CREATININE 1.24 1.03    Estimated Creatinine Clearance: 90.5 mL/min (by C-G formula based on SCr of 1.03 mg/dL).    No Known Allergies  Antimicrobials this admission: Ceftriaxone 10/19 >>  Vanc 10/19 >> (11/2) Acyclovir 10/19 >> (11/2)  Dose adjustments this admission: 10/20: empiric Vanc 750 mg IV q12h > q8h for improved creatinine  Microbiology results: 10/19 CSF: no organisms on gram stain, pending 10/19 COVID and flu: neg 10/19 blood: sent 10/19 HSV PCR: in process 10/19 HIV: non-reactive  Thank you for allowing pharmacy to be a part of this patient's care.  11/19, RPh 11/29/2020 10:56 AM

## 2020-11-29 NOTE — Progress Notes (Signed)
LTM EEG hooked up and running - no initial skin breakdown - push button tested - neuro notified. [facility] monitoring.  

## 2020-11-29 NOTE — Consult Note (Addendum)
Neurology Consult H&P  Adam [name] MR# [id number] 11/29/2020   CC: meningitis with history of seizure.  History is obtained from: patient and chart.  HPI: Adam [name] is a 40 y.o. male PMHx as reviewed below intractable epilepsy with febrile illness and CSF suggesting meningitis and breakthrough seizure.  On exam the patient is somnolent however he is able to interact appropriately. He states that he first started to have seizures at age 71-17 after he had episode of meningitis prolonged hospital stay.  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   Patient is an extremely poor historian due to significant lethargy and confusion.  Majority of the history is been obtained from speaking to the patient's wife as well as review of emergency department provider notes.   According to the wife, the patient has been exhibiting progressively worsening lethargy for approximately 24 hours.  Patient has additionally been complaining of associated generalized severe headaches that seem to have been unrelenting over the same period of time.  Patient has been compliant with his antiepileptic regimen.  Patient has not undergone any recent travel, been exposed any sick contacts or has any contact with known COVID-19 infection as of late.   Due to progressively worsening symptoms of headache and lethargy while decided to [facility] the [facility].  Patient began to exhibit tonic-clonic seizure activity.   Upon evaluation in the emergency department, upon initial evaluation patient was found to exhibit  multiple surgical Teare including fever of 100.6 F and tachycardia in the 110's with substantial leukocytosis.  In the absence of any other source there was immediate concern for possible meningitis.  Lumbar puncture was quickly performed with initial cell count findings found to be consistent with meningitis.  Patient was empirically initiated on intravenous ceftriaxone and vancomycin.  Case was discussed with Dr. Thomasena Edis with neurology who recommended intravenous Keppra  and Vmpat.  Patient's been accepted by the hospital service for transfer to [facility] for continued medical care."   ROS: A complete ROS was performed and is negative except as noted in the HPI.  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 He has never used smokeless tobacco. He reports current alcohol use. He reports current drug use. Drug: Marijuana.   Prior to Admission medications   Medication Sig Start Date End Date Taking? Authorizing Provider  cloBAZam (ONFI) 10 MG tablet Take 1 tablet (10 mg total) by mouth 2 (two) times daily. 11/18/18   Danford, Earl Lites, MD  Lacosamide (VIMPAT) 100 MG TABS Take 3 tablets (300 mg total) by mouth 2 (two) times daily. 11/18/18 12/18/18  Alberteen Sam, MD  levETIRAcetam (KEPPRA) 1000 MG tablet Take 2,500 mg by mouth 2 (two) times daily. 10/27/18   [provider]  LORazepam (ATIVAN) 2 MG/ML injection Inject 1 mL (2 mg total) into the muscle once as needed for up to 1 dose for seizure. 09/18/20   Koleen Distance, MD  Exam: Current vital signs: BP (!) 126/92 (BP Location: Left Leg)   Pulse (!) 105   Temp 99.2 F (37.3 C) (Axillary)   Resp (!) 22   Ht 5\' 11"  (1.803 m)   Wt 65.8 kg   SpO2 100%   BMI 20.22 kg/m   Physical Exam  Constitutional: Appears well-developed  and well-nourished.  Psych: Affect appropriate to situation Eyes: No scleral injection HENT: No OP obstruction. Head: Normocephalic.  Cardiovascular: Normal rate and regular rhythm.  Respiratory: Effort normal, symmetric excursions bilaterally, no audible wheezing. GI: Soft.  No distension. There is no tenderness.  Skin: WDI  Neuro: Mental Status: Patient is is sleepy however wakes to voice.   He is oriented to person, place, year, and situation. Patient is able to give a reasonably clear and coherent history. Speech hypophonic and fluent, intact comprehension and repetition. No signs of aphasia or neglect. Visual Fields are full. Pupils are equal, round, and reactive to light. EOMI without ptosis or diploplia.  Facial sensation is symmetric to temperature Facial movement is symmetric.  Hearing is intact to voice. Uvula midline and palate elevates symmetrically. Shoulder shrug is symmetric. Tongue is midline without atrophy or fasciculations.  Tone is normal. Bulk is normal. 5/5 strength was present in all four extremities. Sensation is symmetric to light touch and temperature in the arms and legs. Deep Tendon Reflexes: 2+ and symmetric in the biceps and patellae. Toes are downgoing bilaterally. Gait - Deferred  I have reviewed labs in epic and the pertinent results are: THC positive WBC 27.8  Ref. Range 11/28/2020 06:21  Appearance, CSF Latest Ref Range: CLEAR  CLOUDY (A)  RBC Count, CSF Latest Ref Range: 0 /cu mm 38 (H)  WBC, CSF Latest Ref Range: 0 - 5 /cu mm 2,855 (HH)  Segmented Neutrophils-CSF Latest Ref Range: 0 - 6 % 97 (H)  Lymphs, CSF Latest Ref Range: 40 - 80 % 2 (L)  Monocyte-Macrophage-Spinal Fluid Latest Ref Range: 15 - 45 % 1 (L)  Eosinophils, CSF Latest Ref Range: 0 - 1 % 0  Color, CSF Latest Ref Range: COLORLESS  YELLOW (A)  Supernatant Unknown XANTHOCHROMIC  Tube # Unknown 4    I have reviewed the images obtained: NCT head showed No acute intracranial  abnormality. Extensive remote bilateral frontal craniotomy/cranioplasty with chronic bilateral inferior frontal lobe encephalomalacia. Previous bilateral paranasal sinus surgery with diffuse acute on chronic sinusitis.  Assessment: Adam [name] is a 40 y.o. male PMHx intractable epilepsy with febrile illness and CSF suggesting meningitis and breakthrough seizure.  On exam the patient is somnolent however he is able to interact appropriately. He states that he first started to have seizures at age 4-17 after he had episode of meningitis prolonged hospital stay.  Currently he is calm and appropriately interactive and most likely not seizing.  He was evaluated for possible Ceribell/rapid EEG however his hair is a limiting factor.  We will order EEG routine to LTM in order to monitor.   Plan: -Lorazepam 4 mg IV for witnessed seizure. -Continue Keppra, lacosamide and clobazam. -Continue empiric antibiotics for meningitis. -Continue acyclovir as LP was xanthrochromic - MRI brain without contrast -he will need send-received coil as he has vagal nerve stimulator. - Routine EEG to LTM - Ordered. -Neurology will continue to follow  Electronically signed by:  15-19, MD Page: Marisue Humble 11/29/2020, 3:36 AM

## 2020-11-29 NOTE — Procedures (Signed)
Patient Name: Adam [name]  MRN: [id number]  Epilepsy Attending: Charlsie Quest  Referring Physician/Provider: Dr Marisue Humble Date: 11/29/2020 Duration: 24.22 mins  Patient history:  40 y.o. male PMHx intractable epilepsy with febrile illness and CSF suggesting meningitis and breakthrough seizure.  EEG to evaluate for seizure.  Level of alertness: asleep  AEDs during EEG study: LEV, LCM, Ativan  Technical aspects: This EEG study was done with scalp electrodes positioned according to the 10-20 International system of electrode placement. Electrical activity was acquired at a sampling rate of 500Hz  and reviewed with a high frequency filter of 70Hz  and a low frequency filter of 1Hz . EEG data were recorded continuously and digitally stored.   Description: Sleep was characterized by vertex waves, sleep spindles (12 to 14 Hz), maximal frontocentral region. Hyperventilation and photic stimulation were not performed.     IMPRESSION: This study during sleep only is within normal limits. No seizures or epileptiform discharges were seen throughout the recording.  Adam [name] 

## 2020-11-29 NOTE — Progress Notes (Signed)
LTM maint complete - no skin breakdown under: Fp2 A2 F8 Reapplied O2 P8 A2 [facility] monitored, Event button test confirmed by [facility].

## 2020-11-29 NOTE — Progress Notes (Signed)
EEG complete - results pending 

## 2020-11-30 DIAGNOSIS — G009 Bacterial meningitis, unspecified: Secondary | ICD-10-CM | POA: Diagnosis not present

## 2020-11-30 DIAGNOSIS — G40919 Epilepsy, unspecified, intractable, without status epilepticus: Secondary | ICD-10-CM | POA: Diagnosis not present

## 2020-11-30 LAB — CBC WITH DIFFERENTIAL/PLATELET
Abs Immature Granulocytes: 0.15 10*3/uL — ABNORMAL HIGH (ref 0.00–0.07)
Basophils Absolute: 0 10*3/uL (ref 0.0–0.1)
Basophils Relative: 0 %
Eosinophils Absolute: 0 10*3/uL (ref 0.0–0.5)
Eosinophils Relative: 0 %
HCT: 36.9 % — ABNORMAL LOW (ref 39.0–52.0)
Hemoglobin: 12.6 g/dL — ABNORMAL LOW (ref 13.0–17.0)
Immature Granulocytes: 1 %
Lymphocytes Relative: 11 %
Lymphs Abs: 2.5 10*3/uL (ref 0.7–4.0)
MCH: 29.9 pg (ref 26.0–34.0)
MCHC: 34.1 g/dL (ref 30.0–36.0)
MCV: 87.4 fL (ref 80.0–100.0)
Monocytes Absolute: 2 10*3/uL — ABNORMAL HIGH (ref 0.1–1.0)
Monocytes Relative: 9 %
Neutro Abs: 17.2 10*3/uL — ABNORMAL HIGH (ref 1.7–7.7)
Neutrophils Relative %: 79 %
Platelets: 160 10*3/uL (ref 150–400)
RBC: 4.22 MIL/uL (ref 4.22–5.81)
RDW: 13.2 % (ref 11.5–15.5)
WBC: 21.9 10*3/uL — ABNORMAL HIGH (ref 4.0–10.5)
nRBC: 0 % (ref 0.0–0.2)

## 2020-11-30 LAB — BASIC METABOLIC PANEL
Anion gap: 8 (ref 5–15)
BUN: 11 mg/dL (ref 6–20)
CO2: 21 mmol/L — ABNORMAL LOW (ref 22–32)
Calcium: 8.2 mg/dL — ABNORMAL LOW (ref 8.9–10.3)
Chloride: 108 mmol/L (ref 98–111)
Creatinine, Ser: 0.95 mg/dL (ref 0.61–1.24)
GFR, Estimated: >60 mL/min (ref >60)
Glucose, Bld: 91 mg/dL (ref 70–99)
Potassium: 3.6 mmol/L (ref 3.5–5.1)
Sodium: 137 mmol/L (ref 135–145)

## 2020-11-30 NOTE — Progress Notes (Signed)
Neurology Progress Note  Brief HPI: 40 y.o. male with a PMHx of intractable epilepsy 2/2 meningitis at age 22 s/p vagus nerve stimulator placement 04/2019, prior nasal sinus surgery, remote bilateral frontal craniotomy/cranioplasty, chronic bilateral inferior frontal lobe encephalomalacia, seizures (on Vimpat and Keppra at home) history of nonconvulsive status epilepticus, marijuana use, anxiety, and depression who presented to [facility] 10/19 for evaluation of one day of progressive lethargy and headaches with witnessed seizure by spouse en route to the ED for evaluation. Initial work up in the ED revealed fever of 100.95F with tachycardia, CBC with leukocytosis, and CSF with findings consistent with meningitis. The patient was started on empiric antibiotics while awaiting culture results.   Subjective: No acute overnight events, patient with improved mental status today Endorses ongoing generalized headache 8/10 in severity without associated photophobia or phonophobia  Exam: Vitals:   11/29/20 2327 11/30/20 0300  BP: 120/66 115/71  Pulse:  82  Resp: 20 (!) 21  Temp:  98.9 F (37.2 C)  SpO2:  99%   Gen: Laying comfortably in bed, in no acute distress, on LTM EEG monitoring Resp: non-labored breathing, no respiratory distress on room air, SpO2 99% on cardiac monitor Neck: Endorses mild tenderness to palpation of midline neck, full active ROM present without tenderness Abd: soft, non-tender, non-distended  Neuro: Mental Status: Drowsy, wakes easily to voice, prefers to have conversation with his eyes closed.  He is alert and oriented x 4. Speech is intact without dysarthria.  He is able to provide details regarding his current hospitalization and states that he is in the hospital because he had a seizure. Notes that he has been compliant with his medications.  No aphasia or neglect noted.  Cranial Nerves: PERRL, EOMI without ptosis, VFF, facial sensation to light touch is intact  and symmetric, face is symmetric resting and smiling, hearing is intact to voice, phonation intact, shoulders shrug symmetrically, tongue is midline.  Motor: 5/5 strength throughout without vertical drift. Tone and bulk are normal.  Sensory: Intact and symmetric to light touch in bilateral upper and lower extremities.  DTR: 2+ and symmetric patellae and biceps Plantars: Toes downgoing bilaterally Gait: Deferred  Pertinent Labs: CBC    Component Value Date/Time   WBC 27.8 (H) 11/29/2020 0526   RBC 4.36 11/29/2020 0526   HGB 13.0 11/29/2020 0526   HCT 38.0 (L) 11/29/2020 0526   PLT 216 11/29/2020 0526   MCV 87.2 11/29/2020 0526   MCH 29.8 11/29/2020 0526   MCHC 34.2 11/29/2020 0526   RDW 12.9 11/29/2020 0526   LYMPHSABS 1.5 11/29/2020 0526   MONOABS 1.9 (H) 11/29/2020 0526   EOSABS 0.0 11/29/2020 0526   BASOSABS 0.0 11/29/2020 0526   CMP     Component Value Date/Time   NA 137 11/30/2020 0137   K 3.6 11/30/2020 0137   CL 108 11/30/2020 0137   CO2 21 (L) 11/30/2020 0137   GLUCOSE 91 11/30/2020 0137   BUN 11 11/30/2020 0137   CREATININE 0.95 11/30/2020 0137   CALCIUM 8.2 (L) 11/30/2020 0137   PROT 7.5 11/29/2020 0526   ALBUMIN 3.4 (L) 11/29/2020 0526   AST 34 11/29/2020 0526   ALT 27 11/29/2020 0526   ALKPHOS 68 11/29/2020 0526   BILITOT 0.5 11/29/2020 0526   GFRNONAA >60 11/30/2020 0137   GFRAA >60 11/18/2018 0303    Ref. Range 11/28/2020 21:58  HIV Screen 4th Generation wRfx Latest Ref Range: Non Reactive  Non Reactive   Tube #  4  1  CM   Comment: Performed at [facility], [address] [place], [place] [address]  Color, CSF COLORLESS YELLOW Abnormal   YELLOW Abnormal    Appearance, CSF CLEAR CLOUDY Abnormal   CLOUDY Abnormal    Supernatant  XANTHOCHROMIC  XANTHOCHROMIC   RBC Count, CSF 0 /cu mm 38 High   64 High    WBC, CSF 0 - 5 /cu mm 2,855 High Panic   4,743 High Panic  CM   Comment: CRITICAL RESULT CALLED TO, READ BACK BY AND VERIFIED  WITH:  BARWICK,J RN @ 1050 11/28/20 LEONARD,A   Segmented Neutrophils-CSF 0 - 6 % 97 High   96 High    Lymphs, CSF 40 - 80 % 2 Low   2 Low    Monocyte-Macrophage-Spinal Fluid 15 - 45 % 1 Low   2 Low    Eosinophils, CSF 0 - 1 % 0  0 CM    Glucose, CSF 40 - 70 mg/dL <73 Low Panic     Total  Protein, CSF 15 - 45 mg/dL >532 High     Gram Stain WBC PRESENT, PREDOMINANTLY PMN  NO ORGANISMS SEEN  CYTOSPIN SMEAR   Culture NO GROWTH 1 DAY    Specimen Description BLOOD BOTTLES DRAWN AEROBIC AND ANAEROBIC  Performed at [facility], [address] [place], [place] [address]   Special Requests Blood Culture adequate volume BLOOD LEFT WRIST  Performed at [facility], [address], [place], [place] [address]   Culture NO GROWTH 1 DAY   Drugs of Abuse     Component Value Date/Time   LABOPIA NONE DETECTED 11/28/2020 2253   COCAINSCRNUR NONE DETECTED 11/28/2020 2253   LABBENZ POSITIVE (A) 11/28/2020 2253   AMPHETMU NONE DETECTED 11/28/2020 2253   THCU POSITIVE (A) 11/28/2020 2253   LABBARB NONE DETECTED 11/28/2020 2253    Urinalysis    Component Value Date/Time   COLORURINE STRAW (A) 11/16/2018 1113   APPEARANCEUR CLEAR 11/16/2018 1113   LABSPEC 1.014 11/16/2018 1113   PHURINE 6.0 11/16/2018 1113   GLUCOSEU 150 (A) 11/16/2018 1113   HGBUR SMALL (A) 11/16/2018 1113   BILIRUBINUR NEGATIVE 11/16/2018 1113   KETONESUR NEGATIVE 11/16/2018 1113   PROTEINUR NEGATIVE 11/16/2018 1113   NITRITE NEGATIVE 11/16/2018 1113   LEUKOCYTESUR NEGATIVE 11/16/2018 1113   Imaging Reviewed: Routine EEG 10/20: "This study during sleep only is within normal limits. No seizures or epileptiform discharges were seen throughout the recording."  Overnight EEG 11/29/2020 - 11/30/2020: "This study is within normal limits. No seizures or epileptiform discharges were seen throughout the recording."  CT Head 10/19: "1. No acute intracranial abnormality. 2. Extensive  remote bilateral frontal craniotomy/cranioplasty with chronic bilateral inferior frontal lobe encephalomalacia. 3. Previous bilateral paranasal sinus surgery with diffuse acute on chronic sinusitis."  Assessment: 40 y.o. male with a PMHx of intractable epilepsy with febrile illness and CSF suggesting meningitis and breakthrough seizure.  - Examination and interview today reveals improvement in mental status, but with ongoing generalized headache. Patient also endorses mild tenderness to palpation of midline neck without limitation of ROM.  - EEG overnight is within normal limits without seizures or epileptiform discharges.  - Presentation is most consistent with a breakthrough seizure that was provoked in the setting of meningitis with CSF findings of xanthochromia with a neutrophilic predominant severe pleocytosis, cloudy appearance, low glucose, and severely elevated protein. He has been started on empiric meningitis coverage which will be modified pending CSF culture results.  - CSF bacterial  culture: No growth x 2 days.  - CSF gram stain: No organisms seen - Blood cultures x 2: No growth to date  Recommendations: - Discontinue LTM monitoring - MRI brain with and without contrast ordered, pending - Continue home antiepileptic medications: Keppra and lacosamide - Patient states he does not take clobazam (Onfi), which is listed in Epic as one of his home anticonvulsants.  - Continue meningitis coverage; ceftriaxone, vancomycin and acyclovir - Inpatient seizure precautions - 2 mg IV Ativan PRN for any seizure lasting > 5 minutes and notify neurology  Lanae Boast, AGACNP-BC [facility] [phone]  Electronically signed: Dr. Caryl Pina

## 2020-11-30 NOTE — Progress Notes (Signed)
Pt has VNS system. Will need to have impedance checked and be programmed as to turn off all stimulation before MRI can be done. Programming will have to be done in MRI area so Technologist can witness. Elbert sent to ordering NP.

## 2020-11-30 NOTE — Procedures (Addendum)
Patient Name: Adam [name]  MRN: [id number]  Epilepsy Attending: Charlsie Quest  Referring Physician/Provider: Dr Marisue Humble Duration: 11/29/2020 1239 to 11/30/2020 1123   Patient history:  40 y.o. male PMHx intractable epilepsy with febrile illness and CSF suggesting meningitis and breakthrough seizure.  EEG to evaluate for seizure.   Level of alertness: awake, asleep   AEDs during EEG study: LEV, LCM   Technical aspects: This EEG study was done with scalp electrodes positioned according to the 10-20 International system of electrode placement. Electrical activity was acquired at a sampling rate of 500Hz  and reviewed with a high frequency filter of 70Hz  and a low frequency filter of 1Hz . EEG data were recorded continuously and digitally stored.    Description:  The posterior dominant rhythm consists of 9 Hz activity of moderate voltage (25-35 uV) seen predominantly in posterior head regions, symmetric and reactive to eye opening and eye closing. Sleep was characterized by vertex waves, sleep spindles (12 to 14 Hz), maximal frontocentral region.  EEG also showed continuous generalized 15 to 18 Hz beta activity. Hyperventilation and photic stimulation were not performed.     ABNORMALITY -Excessive beta, generalized  IMPRESSION: This study is within normal limits. The excessive beta activity is a benign EEG pattern which can be seen due to benzodiazepine use. No seizures or epileptiform discharges were seen throughout the recording.  Nesta Kimple 

## 2020-11-30 NOTE — Progress Notes (Signed)
LTM EEG discontinued - no skin breakdown at unhook.   

## 2020-11-30 NOTE — Progress Notes (Signed)
[facility] PROGRESS NOTE    Progress Note  Adam [name]  WUJ:[id number] DOB: [birth year] DOA: 11/28/2020 PCP: Patient, No Pcp Per (Inactive)     Brief Narrative:   Adam [name] is an 40 y.o. male past medical history of intractable epilepsy, status post vagus nerve stimulation on March 2021, history of nonconvulsive status epilepticus, cannabis use, depression who presented to [facility] dropp[facility] emergency for 1 day of progressive worsening headache lethargy and seizure activity reported by the patient's spouse.  As per the wife the patient has been compliant with his antiepileptic drug has not undergone any recent travel exposures or changes in her medication.  Due to her progressive weakness she decided to bring him to the ED where he exhibited tonic-clonic movement activity.  Patient was initially started empirically on Rocephin and vancomycin the case case was discussed with neurology recommended loading her up with IV Keppra and Vimpat.  CT of the head showed no acute cranial abnormalitie but it did showed extensive remote bilateral frontal craniotomy with chronic bilateral inferior frontal lobe encephalomalacia.   Assessment/Plan:   Breakthrough seizure (HCC) Breakthrough seizure in the ED with a known history of intractable epilepsy. She was loaded with Vimpat and Keppra. EEG showed no signs of seizures. He has been noncompliant with his seizure medications at home. Continue Ativan as needed dose has been decreased. Continue empirically Vanco, cefepime and acyclovir, LP was xanthrochromic. Appreciate neurology's assistance. Haldol for agitation.  Possible acute bacterial meningitis Arrival to the ED she was experiencing's SIRS criteria with fevers and leukocytosis. Gram stain remains negative, cultures has been sent. Continue IV Rocephin and vancomycin, acyclovir was added. Has remained afebrile leukocytosis pending this morning   DVT prophylaxis: lovenox Family  Communication:none Status is: Inpatient  Remains inpatient appropriate because: Acute severity of illness dictated the patient is inpatient and there is a concern for acute meningitis      Code Status:     Code Status Orders  (From admission, onward)           Start     Ordered   11/28/20 2130  Full code  Continuous        11/28/20 2130           Code Status History     Date Active Date Inactive Code Status Order ID Comments User Context   11/16/2018 1208 11/18/2018 2051 Full Code [id number]  Jonah Blue, MD ED         IV Access:   Peripheral IV   Procedures and diagnostic studies:   EEG adult  Result Date: 11/29/2020 Charlsie Quest, MD     11/29/2020  2:33 PM Patient Name: Adam [name] MRN: [id number] Epilepsy Attending: Charlsie Quest Referring Physician/Provider: Dr Marisue Humble Date: 11/29/2020 Duration: 24.22 mins Patient history:  40 y.o. male PMHx intractable epilepsy with febrile illness and CSF suggesting meningitis and breakthrough seizure.  EEG to evaluate for seizure. Level of alertness: asleep AEDs during EEG study: LEV, LCM, Ativan Technical aspects: This EEG study was done with scalp electrodes positioned according to the 10-20 International system of electrode placement. Electrical activity was acquired at a sampling rate of 500Hz  and reviewed with a high frequency filter of 70Hz  and a low frequency filter of 1Hz . EEG data were recorded continuously and digitally stored. Description: Sleep was characterized by vertex waves, sleep spindles (12 to 14 Hz), maximal frontocentral region. Hyperventilation and photic stimulation were not performed.   IMPRESSION: This study during sleep only is within  normal limits. No seizures or epileptiform discharges were seen throughout the recording. Charlsie Quest     Medical Consultants:   None.   Subjective:    Adam [name] awake this morning relates he is compliant with his seizure  medication.  Objective:    Vitals:   11/29/20 2044 11/29/20 2327 11/30/20 0300 11/30/20 0817  BP: 127/75 120/66 115/71 126/75  Pulse: 78  82 91  Resp: 15 20 (!) 21 20  Temp: 98.3 F (36.8 C) 99.3 F (37.4 C) 98.9 F (37.2 C) 99.5 F (37.5 C)  TempSrc: Axillary Oral Oral Oral  SpO2: 98% 99% 99%   Weight:      Height:       SpO2: 99 %   Intake/Output Summary (Last 24 hours) at 11/30/2020 0836 Last data filed at 11/30/2020 0454 Gross per 24 hour  Intake 3556.69 ml  Output 1502 ml  Net 2054.69 ml    Filed Weights   11/28/20 0451  Weight: 65.8 kg    Exam: General exam: In no acute distress. Respiratory system: Good air movement and clear to auscultation. Cardiovascular system: S1 & S2 heard, RRR. No JVD. Gastrointestinal system: Abdomen is nondistended, soft and nontender.  Extremities: No pedal edema. Skin: No rashes, lesions or ulcers Psychiatry: Judgement and insight appear normal. Mood & affect appropriate.   Data Reviewed:    Labs: Basic Metabolic Panel: Recent Labs  Lab 11/28/20 0449 11/29/20 0526 11/30/20 0137  NA 136 137 137  K 3.3* 3.6 3.6  CL 95* 103 108  CO2 12* 23 21*  GLUCOSE 164* 105* 91  BUN 10 11 11   CREATININE 1.24 1.03 0.95  CALCIUM 10.2 8.9 8.2*  MG  --  2.0  --     GFR Estimated Creatinine Clearance: 98.1 mL/min (by C-G formula based on SCr of 0.95 mg/dL). Liver Function Tests: Recent Labs  Lab 11/28/20 0449 11/29/20 0526  AST 30 34  ALT 26 27  ALKPHOS 78 68  BILITOT 0.5 0.5  PROT 8.4* 7.5  ALBUMIN 4.8 3.4*    Recent Labs  Lab 11/28/20 0449  LIPASE 20    No results for input(s): AMMONIA in the last 168 hours. Coagulation profile No results for input(s): INR, PROTIME in the last 168 hours. COVID-19 Labs  No results for input(s): DDIMER, FERRITIN, LDH, CRP in the last 72 hours.  Lab Results  Component Value Date   SARSCOV2NAA NEGATIVE 11/28/2020   SARSCOV2NAA NEGATIVE 10/22/2019   SARSCOV2NAA NEGATIVE  11/16/2018    CBC: Recent Labs  Lab 11/28/20 0449 11/29/20 0526  WBC 23.4* 27.8*  NEUTROABS 20.4* 24.1*  HGB 13.5 13.0  HCT 42.0 38.0*  MCV 92.5 87.2  PLT 265 216    Cardiac Enzymes: No results for input(s): CKTOTAL, CKMB, CKMBINDEX, TROPONINI in the last 168 hours. BNP (last 3 results) No results for input(s): PROBNP in the last 8760 hours. CBG: No results for input(s): GLUCAP in the last 168 hours. D-Dimer: No results for input(s): DDIMER in the last 72 hours. Hgb A1c: No results for input(s): HGBA1C in the last 72 hours. Lipid Profile: No results for input(s): CHOL, HDL, LDLCALC, TRIG, CHOLHDL, LDLDIRECT in the last 72 hours. Thyroid function studies: No results for input(s): TSH, T4TOTAL, T3FREE, THYROIDAB in the last 72 hours.  Invalid input(s): FREET3 Anemia work up: No results for input(s): VITAMINB12, FOLATE, FERRITIN, TIBC, IRON, RETICCTPCT in the last 72 hours. Sepsis Labs: Recent Labs  Lab 11/28/20 0449 11/29/20 0526  WBC 23.4* 27.8*  Microbiology Recent Results (from the past 240 hour(s))  Resp Panel by RT-PCR (Flu A&B, Covid) Nasopharyngeal Swab     Status: None   Collection Time: 11/28/20  4:47 AM   Specimen: Nasopharyngeal Swab; Nasopharyngeal(NP) swabs in vial transport medium  Result Value Ref Range Status   SARS Coronavirus 2 by RT PCR NEGATIVE NEGATIVE Final    Comment: (NOTE) SARS-CoV-2 target nucleic acids are NOT DETECTED.  The SARS-CoV-2 RNA is generally detectable in upper respiratory specimens during the acute phase of infection. The lowest concentration of SARS-CoV-2 viral copies this assay can detect is 138 copies/mL. A negative result does not preclude SARS-Cov-2 infection and should not be used as the sole basis for treatment or other patient management decisions. A negative result may occur with  improper specimen collection/handling, submission of specimen other than nasopharyngeal swab, presence of viral mutation(s) within  the areas targeted by this assay, and inadequate number of viral copies(<138 copies/mL). A negative result must be combined with clinical observations, patient history, and epidemiological information. The expected result is Negative.  Fact Sheet for Patients:  BloggerCourse.com  Fact Sheet for Healthcare Providers:  SeriousBroker.it  This test is no t yet approved or cleared by the [place] FDA and  has been authorized for detection and/or diagnosis of SARS-CoV-2 by FDA under an Emergency Use Authorization (EUA). This EUA will remain  in effect (meaning this test can be used) for the duration of the COVID-19 declaration under Section 564(b)(1) of the Act, 21 U.S.C.section 360bbb-3(b)(1), unless the authorization is terminated  or revoked sooner.       Influenza A by PCR NEGATIVE NEGATIVE Final   Influenza B by PCR NEGATIVE NEGATIVE Final    Comment: (NOTE) The Xpert Xpress SARS-CoV-2/FLU/RSV plus assay is intended as an aid in the diagnosis of influenza from Nasopharyngeal swab specimens and should not be used as a sole basis for treatment. Nasal washings and aspirates are unacceptable for Xpert Xpress SARS-CoV-2/FLU/RSV testing.  Fact Sheet for Patients: BloggerCourse.com  Fact Sheet for Healthcare Providers: SeriousBroker.it  This test is not yet approved or cleared by the [place] FDA and has been authorized for detection and/or diagnosis of SARS-CoV-2 by FDA under an Emergency Use Authorization (EUA). This EUA will remain in effect (meaning this test can be used) for the duration of the COVID-19 declaration under Section 564(b)(1) of the Act, 21 U.S.C. section 360bbb-3(b)(1), unless the authorization is terminated or revoked.  Performed at [facility], [address] [place], [place] [address]   CSF culture     Status: None (Preliminary  result)   Collection Time: 11/28/20  6:21 AM   Specimen: CSF; Cerebrospinal Fluid  Result Value Ref Range Status   Specimen Description CSF  Final   Special Requests BACK  Final   Gram Stain   Final    WBC PRESENT, PREDOMINANTLY PMN NO ORGANISMS SEEN CYTOSPIN SMEAR    Culture   Final    NO GROWTH 1 DAY Performed at [facility], [address] [place], [place] [address]    Report Status PENDING  Incomplete  Culture, blood (routine x 2)     Status: None (Preliminary result)   Collection Time: 11/28/20  6:45 AM   Specimen: BLOOD  Result Value Ref Range Status   Specimen Description   Final    BLOOD BOTTLES DRAWN AEROBIC AND ANAEROBIC Performed at [facility], [address], [place], [place] [address]    Special Requests  Final    Blood Culture adequate volume BLOOD LEFT WRIST Performed at [facility], [address], [place], [place] [address]    Culture   Final    NO GROWTH 1 DAY Performed at [facility], [address], [place], [place] [address]    Report Status PENDING  Incomplete  Culture, blood (routine x 2)     Status: None (Preliminary result)   Collection Time: 11/28/20  6:48 AM   Specimen: BLOOD  Result Value Ref Range Status   Specimen Description   Final    BLOOD BOTTLES DRAWN AEROBIC AND ANAEROBIC Performed at [facility], [address], [place], [place] [address]    Special Requests   Final    Blood Culture adequate volume BLOOD LEFT WRIST Performed at [facility], [address], [place], [place] [address]    Culture   Final    NO GROWTH 1 DAY Performed at [facility], [address], [place], [place] [address]    Report Status PENDING  Incomplete     Medications:    enoxaparin (LOVENOX) injection  40 mg Subcutaneous Q24H   lidocaine (PF)  10 mL Infiltration Once   Continuous Infusions:  sodium chloride 125 mL/hr at 11/29/20 2235    acyclovir 660 mg (11/30/20 0533)   cefTRIAXone (ROCEPHIN)  IV 2 g (11/29/20 2328)   lacosamide (VIMPAT) IV 300 mg (11/30/20 0017)   levETIRAcetam 2,500 mg (11/29/20 2236)   vancomycin 750 mg (11/30/20 0801)      LOS: 2 days   Marinda Elk  [facility]  11/30/2020, 8:36 AM

## 2020-11-30 NOTE — Progress Notes (Signed)
Patient and significant other have concern for current hospital seizure medications vs. Home seizure medications.  Patient and significant other explained that he takes the seizure medication (cloBAZam/Onfi 20mg  twice a day at 0900 and 2100) for seizures at home.   I looked in the order history and there is an order for Onfi of 20mg  by 'Historical Provider MD' and patient's significant other expressed that patient has not been scheduled this medication to take yet.   Significant other was on the phone with patient's primary neurologist/epilepsy specialist [id number], MD) and he expressed desire for the patient to be on this medication as soon as possible due it being in the patient's home medication routine.   Dr. would like to be called if there are concerns. The number provided is [phone]

## 2020-12-01 DIAGNOSIS — G039 Meningitis, unspecified: Secondary | ICD-10-CM

## 2020-12-01 LAB — BASIC METABOLIC PANEL
Anion gap: 6 (ref 5–15)
BUN: 7 mg/dL (ref 6–20)
CO2: 23 mmol/L (ref 22–32)
Calcium: 8.2 mg/dL — ABNORMAL LOW (ref 8.9–10.3)
Chloride: 110 mmol/L (ref 98–111)
Creatinine, Ser: 0.93 mg/dL (ref 0.61–1.24)
GFR, Estimated: >60 mL/min (ref >60)
Glucose, Bld: 110 mg/dL — ABNORMAL HIGH (ref 70–99)
Potassium: 3.5 mmol/L (ref 3.5–5.1)
Sodium: 139 mmol/L (ref 135–145)

## 2020-12-01 LAB — CSF CULTURE W GRAM STAIN: Culture: NO GROWTH

## 2020-12-01 MED ORDER — VANCOMYCIN HCL IN DEXTROSE 1-5 GM/200ML-% IV SOLN
1000.0000 mg | Freq: Three times a day (TID) | INTRAVENOUS | Status: DC
  Administered 2020-12-01 – 2020-12-04 (×8): 1000 mg via INTRAVENOUS
  Filled 2020-12-01 (×10): qty 200

## 2020-12-01 MED ORDER — CLOBAZAM 10 MG PO TABS
20.0000 mg | ORAL_TABLET | Freq: Two times a day (BID) | ORAL | Status: DC
  Administered 2020-12-01 – 2020-12-06 (×10): 20 mg via ORAL
  Filled 2020-12-01 (×10): qty 2

## 2020-12-01 NOTE — Progress Notes (Signed)
24-hour EEG showed no signs of seizures some beta activity probably due to benzos.  [facility] PROGRESS NOTE    Progress Note  Adam [name]  RSW:[id number] DOB: [birth year] DOA: 11/28/2020 PCP: Patient, No Pcp Per (Inactive)     Brief Narrative:   Adam [name] is an 40 y.o. male past medical history of intractable epilepsy, status post vagus nerve stimulation on March 2021, history of nonconvulsive status epilepticus, cannabis use, depression who presented to [place] emergency for 1 day of progressive worsening headache lethargy and seizure activity reported by the patient's spouse.  As per the wife the patient has been compliant with his antiepileptic drug has not undergone any recent travel exposures or changes in her medication.  Due to her progressive weakness she decided to bring him to the ED where he exhibited tonic-clonic movement activity.  Patient was initially started empirically on Rocephin and vancomycin the case case was discussed with neurology recommended loading her up with IV Keppra and Vimpat.  CT of the head showed no acute cranial abnormalitie but it did showed extensive remote bilateral frontal craniotomy with chronic bilateral inferior frontal lobe encephalomalacia.   Assessment/Plan:   Breakthrough seizure (Fate) Breakthrough seizure in the ED with a known history of intractable epilepsy. She was loaded with Vimpat and Keppra. 24-hour EEG showed no signs of seizures, beta activity is likely due to benzos. Continue Ativan as needed dose has been decreased. Continue empirically Vanco, cefepime and acyclovir, LP was xanthrochromic. Haldol for agitation appreciate neurology assistance.  Possible acute bacterial meningitis Met SIRS criteria in the ED with fever and leukocytosis. Culture data has remained negative till date. Continue acyclovir, IV Rocephin and vancomycin. Afebrile leukocytosis improving.   DVT prophylaxis: lovenox Family  Communication:none Status is: Inpatient  Remains inpatient appropriate because: Acute severity of illness dictated the patient is inpatient and there is a concern for acute meningitis      Code Status:     Code Status Orders  (From admission, onward)           Start     Ordered   11/28/20 2130  Full code  Continuous        11/28/20 2130           Code Status History     Date Active Date Inactive Code Status Order ID Comments User Context   11/16/2018 1208 11/18/2018 2051 Full Code [id number]  Karmen Bongo, MD ED         IV Access:   Peripheral IV   Procedures and diagnostic studies:   EEG adult  Result Date: 11/29/2020 Lora Havens, MD     11/29/2020  2:33 PM Patient Name: Adam [name] MRN: [id number] Epilepsy Attending: Lora Havens Referring Physician/Provider: Dr Lynnae Sandhoff Date: 11/29/2020 Duration: 24.22 mins Patient history:  40 y.o. male PMHx intractable epilepsy with febrile illness and CSF suggesting meningitis and breakthrough seizure.  EEG to evaluate for seizure. Level of alertness: asleep AEDs during EEG study: LEV, LCM, Ativan Technical aspects: This EEG study was done with scalp electrodes positioned according to the 10-20 International system of electrode placement. Electrical activity was acquired at a sampling rate of 500Hz  and reviewed with a high frequency filter of 70Hz  and a low frequency filter of 1Hz . EEG data were recorded continuously and digitally stored. Description: Sleep was characterized by vertex waves, sleep spindles (12 to 14 Hz), maximal frontocentral region. Hyperventilation and photic stimulation were not performed.   IMPRESSION: This study during sleep  only is within normal limits. No seizures or epileptiform discharges were seen throughout the recording. Priyanka Barbra Sarks   Overnight EEG with video  Result Date: 11/30/2020 Lora Havens, MD     11/30/2020 12:32 PM Patient Name: Adam [name] MRN: [id number]  Epilepsy Attending: Lora Havens Referring Physician/Provider: Dr Lynnae Sandhoff Duration: 11/29/2020 1239 to 11/30/2020 1123  Patient history:  40 y.o. male PMHx intractable epilepsy with febrile illness and CSF suggesting meningitis and breakthrough seizure.  EEG to evaluate for seizure.  Level of alertness: awake, asleep  AEDs during EEG study: LEV, LCM  Technical aspects: This EEG study was done with scalp electrodes positioned according to the 10-20 International system of electrode placement. Electrical activity was acquired at a sampling rate of 500Hz  and reviewed with a high frequency filter of 70Hz  and a low frequency filter of 1Hz . EEG data were recorded continuously and digitally stored.  Description:  The posterior dominant rhythm consists of 9 Hz activity of moderate voltage (25-35 uV) seen predominantly in posterior head regions, symmetric and reactive to eye opening and eye closing. Sleep was characterized by vertex waves, sleep spindles (12 to 14 Hz), maximal frontocentral region.  EEG also showed continuous generalized 15 to 18 Hz beta activity. Hyperventilation and photic stimulation were not performed.   ABNORMALITY -Excessive beta, generalized IMPRESSION: This study is within normal limits. The excessive beta activity is a benign EEG pattern which can be seen due to benzodiazepine use. No seizures or epileptiform discharges were seen throughout the recording. [facility]:   None.   Subjective:    Adam [name] a relates he has been taking his medication, feels great tolerating his diet.  General exam: In no acute distress. Respiratory system: Good air movement and clear to auscultation. Cardiovascular system: S1 & S2 heard, RRR. No JVD. Gastrointestinal system: Abdomen is nondistended, soft and nontender.  Extremities: No pedal edema. Skin: No rashes, lesions or ulcers  Objective:    Vitals:   11/30/20 2014 11/30/20 2359 12/01/20 0357  12/01/20 0805  BP: 106/82 125/66 120/79 117/73  Pulse:    75  Resp: 17 19 20 18   Temp:  98 F (36.7 C) 98.5 F (36.9 C) 98.9 F (37.2 C)  TempSrc: Oral Oral Oral Oral  SpO2: 100% 97% 98% 96%  Weight:      Height:       SpO2: 96 %   Intake/Output Summary (Last 24 hours) at 12/01/2020 0949 Last data filed at 12/01/2020 0658 Gross per 24 hour  Intake 4947.66 ml  Output 6125 ml  Net -1177.34 ml    Filed Weights   11/28/20 0451  Weight: 65.8 kg    Exam: General exam: In no acute distress. Respiratory system: Good air movement and clear to auscultation. Cardiovascular system: S1 & S2 heard, RRR. No JVD. Gastrointestinal system: Abdomen is nondistended, soft and nontender.  Extremities: No pedal edema. Skin: No rashes, lesions or ulcers Psychiatry: Judgement and insight appear normal. Mood & affect appropriate.   Data Reviewed:    Labs: Basic Metabolic Panel: Recent Labs  Lab 11/28/20 0449 11/29/20 0526 11/30/20 0137 12/01/20 0151  NA 136 137 137 139  K 3.3* 3.6 3.6 3.5  CL 95* 103 108 110  CO2 12* 23 21* 23  GLUCOSE 164* 105* 91 110*  BUN 10 11 11 7   CREATININE 1.24 1.03 0.95 0.93  CALCIUM 10.2 8.9 8.2* 8.2*  MG  --  2.0  --   --  GFR Estimated Creatinine Clearance: 100.2 mL/min (by C-G formula based on SCr of 0.93 mg/dL). Liver Function Tests: Recent Labs  Lab 11/28/20 0449 11/29/20 0526  AST 30 34  ALT 26 27  ALKPHOS 78 68  BILITOT 0.5 0.5  PROT 8.4* 7.5  ALBUMIN 4.8 3.4*    Recent Labs  Lab 11/28/20 0449  LIPASE 20    No results for input(s): AMMONIA in the last 168 hours. Coagulation profile No results for input(s): INR, PROTIME in the last 168 hours. COVID-19 Labs  No results for input(s): DDIMER, FERRITIN, LDH, CRP in the last 72 hours.  Lab Results  Component Value Date   SARSCOV2NAA NEGATIVE 11/28/2020   [place] NEGATIVE 10/22/2019   [place] NEGATIVE 11/16/2018    CBC: Recent Labs  Lab 11/28/20 0449  11/29/20 0526 11/30/20 0137  WBC 23.4* 27.8* 21.9*  NEUTROABS 20.4* 24.1* 17.2*  HGB 13.5 13.0 12.6*  HCT 42.0 38.0* 36.9*  MCV 92.5 87.2 87.4  PLT 265 216 160    Cardiac Enzymes: No results for input(s): CKTOTAL, CKMB, CKMBINDEX, TROPONINI in the last 168 hours. BNP (last 3 results) No results for input(s): PROBNP in the last 8760 hours. CBG: No results for input(s): GLUCAP in the last 168 hours. D-Dimer: No results for input(s): DDIMER in the last 72 hours. Hgb A1c: No results for input(s): HGBA1C in the last 72 hours. Lipid Profile: No results for input(s): CHOL, HDL, LDLCALC, TRIG, CHOLHDL, LDLDIRECT in the last 72 hours. Thyroid function studies: No results for input(s): TSH, T4TOTAL, T3FREE, THYROIDAB in the last 72 hours.  Invalid input(s): FREET3 Anemia work up: No results for input(s): VITAMINB12, FOLATE, FERRITIN, TIBC, IRON, RETICCTPCT in the last 72 hours. Sepsis Labs: Recent Labs  Lab 11/28/20 0449 11/29/20 0526 11/30/20 0137  WBC 23.4* 27.8* 21.9*    Microbiology Recent Results (from the past 240 hour(s))  Resp Panel by RT-PCR (Flu A&B, Covid) Nasopharyngeal Swab     Status: None   Collection Time: 11/28/20  4:47 AM   Specimen: Nasopharyngeal Swab; Nasopharyngeal(NP) swabs in vial transport medium  Result Value Ref Range Status   SARS Coronavirus 2 by RT PCR NEGATIVE NEGATIVE Final    Comment: (NOTE) SARS-CoV-2 target nucleic acids are NOT DETECTED.  The SARS-CoV-2 RNA is generally detectable in upper respiratory specimens during the acute phase of infection. The lowest concentration of SARS-CoV-2 viral copies this assay can detect is 138 copies/mL. A negative result does not preclude SARS-Cov-2 infection and should not be used as the sole basis for treatment or other patient management decisions. A negative result may occur with  improper specimen collection/handling, submission of specimen other than nasopharyngeal swab, presence of viral  mutation(s) within the areas targeted by this assay, and inadequate number of viral copies(<138 copies/mL). A negative result must be combined with clinical observations, patient history, and epidemiological information. The expected result is Negative.  Fact Sheet for Patients:  EntrepreneurPulse.com.au  Fact Sheet for Healthcare Providers:  IncredibleEmployment.be  This test is no t yet approved or cleared by the [place] FDA and  has been authorized for detection and/or diagnosis of SARS-CoV-2 by FDA under an Emergency Use Authorization (EUA). This EUA will remain  in effect (meaning this test can be used) for the duration of the COVID-19 declaration under Section 564(b)(1) of the Act, 21 U.S.C.section 360bbb-3(b)(1), unless the authorization is terminated  or revoked sooner.       Influenza A by PCR NEGATIVE NEGATIVE Final   Influenza B by PCR  NEGATIVE NEGATIVE Final    Comment: (NOTE) The Xpert Xpress SARS-CoV-2/FLU/RSV plus assay is intended as an aid in the diagnosis of influenza from Nasopharyngeal swab specimens and should not be used as a sole basis for treatment. Nasal washings and aspirates are unacceptable for Xpert Xpress SARS-CoV-2/FLU/RSV testing.  Fact Sheet for Patients: EntrepreneurPulse.com.au  Fact Sheet for Healthcare Providers: IncredibleEmployment.be  This test is not yet approved or cleared by the [place] FDA and has been authorized for detection and/or diagnosis of SARS-CoV-2 by FDA under an Emergency Use Authorization (EUA). This EUA will remain in effect (meaning this test can be used) for the duration of the COVID-19 declaration under Section 564(b)(1) of the Act, 21 U.S.C. section 360bbb-3(b)(1), unless the authorization is terminated or revoked.  Performed at [facility], [address] [place], [place] [address]   CSF culture      Status: None (Preliminary result)   Collection Time: 11/28/20  6:21 AM   Specimen: CSF; Cerebrospinal Fluid  Result Value Ref Range Status   Specimen Description CSF  Final   Special Requests BACK  Final   Gram Stain   Final    WBC PRESENT, PREDOMINANTLY PMN NO ORGANISMS SEEN CYTOSPIN SMEAR    Culture   Final    NO GROWTH 3 DAYS Performed at [facility], [place] [address], [place], [place] [address]    Report Status PENDING  Incomplete  Culture, blood (routine x 2)     Status: None (Preliminary result)   Collection Time: 11/28/20  6:45 AM   Specimen: BLOOD  Result Value Ref Range Status   Specimen Description   Final    BLOOD BOTTLES DRAWN AEROBIC AND ANAEROBIC Performed at [facility], [address] [place], [place] [address]    Special Requests   Final    Blood Culture adequate volume BLOOD LEFT WRIST Performed at [facility], [address], [place], [place] [address]    Culture   Final    NO GROWTH 2 DAYS Performed at [facility], [place] [address], [place], [place] [address]    Report Status PENDING  Incomplete  Culture, blood (routine x 2)     Status: None (Preliminary result)   Collection Time: 11/28/20  6:48 AM   Specimen: BLOOD  Result Value Ref Range Status   Specimen Description   Final    BLOOD BOTTLES DRAWN AEROBIC AND ANAEROBIC Performed at [facility], [address], [place], [place] [address]    Special Requests   Final    Blood Culture adequate volume BLOOD LEFT WRIST Performed at [facility], [address] [place], [place] [address]    Culture   Final    NO GROWTH 2 DAYS Performed at [facility], [place] [address] [place], [place] [address]    Report Status PENDING  Incomplete  Culture, blood (single) w Reflex to ID Panel     Status: None (Preliminary result)   Collection Time: 11/29/20  5:26 AM   Specimen: BLOOD LEFT ARM  Result Value Ref Range  Status   Specimen Description BLOOD LEFT ARM  Final   Special Requests AEROBIC BOTTLE ONLY Blood Culture adequate volume  Final   Culture   Final    NO GROWTH 1 DAY Performed at [facility], [place] [address], [place], [place] [address]    Report Status PENDING  Incomplete     Medications:    enoxaparin (LOVENOX) injection  40 mg Subcutaneous Q24H   lidocaine (PF)  10 mL Infiltration Once   Continuous Infusions:  sodium chloride 125 mL/hr at 11/30/20 2303   acyclovir 660 mg (12/01/20 0623)   cefTRIAXone (ROCEPHIN)  IV 2 g (12/01/20 0026)   lacosamide (VIMPAT) IV 300 mg (11/30/20 2303)   levETIRAcetam 2,500 mg (12/01/20 0809)   vancomycin        LOS: 3 days   Charlynne Cousins  [facility]  12/01/2020, 9:49 AM

## 2020-12-01 NOTE — Progress Notes (Signed)
Pharmacy Antibiotic Note  Adam [name] is a 40 y.o. male admitted on 11/28/2020 with meningitis.  Pharmacy has been consulted for Vanco, Acyclovir dosing.  ID: D4/14 abx for r/o  meningitis Tmax 99.5, WBC 21.9 down some - marked acute inflammation on pathologist smear review 10/19 - LP 10/19: cloudy, yellow, WBCs up 4743 and 2855, protein >600, RBCs 38 and 64 - HSV PCR negative  CTX 10/19 >> (no stop date in place) Vanc 10/19 >> (11/2) Acyclovir 10/19 >> plan 11/2 - stop date NOT in place  10/19 CSF: NGTD 10/19 COVID and flu: neg 10/19 blood: NGTD 10/19 HSV PCR: neg 10/19 HIV: non-reactive  Plan: Acyclovir 660mg  (10mg /kg) q8h (x 14 days) - HSV negative, could d/c - Increase Vanco to 1000mg  IV q12 for CrCl 100 per nomogram dosing. CTX 2gm IV q12h     Height: 5\' 11"  (180.3 cm) Weight: 65.8 kg (145 lb) IBW/kg (Calculated) : 75.3  Temp (24hrs), Avg:98.8 F (37.1 C), Min:98 F (36.7 C), Max:99.5 F (37.5 C)  Recent Labs  Lab 11/28/20 0449 11/29/20 0526 11/30/20 0137 12/01/20 0151  WBC 23.4* 27.8* 21.9*  --   CREATININE 1.24 1.03 0.95 0.93    Estimated Creatinine Clearance: 100.2 mL/min (by C-G formula based on SCr of 0.93 mg/dL).    No Known Allergies  Consuelo Thayne S. 11/30/20, PharmD, BCPS Clinical Staff Pharmacist [email or web address]   12/01/20 12/01/2020 8:08 AM

## 2020-12-01 NOTE — Evaluation (Signed)
Physical Therapy Evaluation Patient Details Name: Adam [name] MRN: [id number] DOB: January 21, 1981 Today's Date: 12/01/2020  History of Present Illness  40 year old male with past medical history of intractable epilepsy S/P Vagus Nerve Stimulation 04/2019, history of nonconvulsive status epilepticus , THC use, anxiety disorder, depression who presents to [facility] with a 1 day history of progressively worsening lethargy and headaches with witnessed seizure activity by the patient's spouse in route. Ruling out meningitis   Clinical Impression  Pt mildly deconditioned compared to baseline. Pt eager to ambulation and tolerated well. Pt with decreased activity tolerance but motivated to work. Acute PT to cont to follow to progress mobility and towards independence.       Recommendations for follow up therapy are one component of a multi-disciplinary discharge planning process, led by the attending physician.  Recommendations may be updated based on patient status, additional functional criteria and insurance authorization.  Follow Up Recommendations No PT follow up    Equipment Recommendations  None recommended by PT    Recommendations for Other Services       Precautions / Restrictions Precautions Precautions: Other (comment) Precaution Comments: seizures, droplet precautions Restrictions Weight Bearing Restrictions: No      Mobility  Bed Mobility Overal bed mobility: Modified Independent             General bed mobility comments: HOB elevated    Transfers Overall transfer level: Needs assistance Equipment used: None Transfers: Sit to/from Stand Sit to Stand: Supervision         General transfer comment: no difficulty, supervision for lines  Ambulation/Gait Ambulation/Gait assistance: Min guard Gait Distance (Feet): 150 Feet Assistive device: IV Pole Gait Pattern/deviations: Step-through pattern;Decreased stride length Gait velocity: slower than  normal   General Gait Details: steady, slower than normal, pt reports "this feels good"  Stairs            Wheelchair Mobility    Modified Rankin (Stroke Patients Only)       Balance Overall balance assessment: Mild deficits observed, not formally tested                                           Pertinent Vitals/Pain Pain Assessment: No/denies pain    Home Living Family/patient expects to be discharged to:: Private residence Living Arrangements: Spouse/significant other;Children (kids, 10 and 9) Available Help at Discharge: Family;Available 24 hours/day (wife works from home) Type of Home: House Home Access: Level entry     Home Layout: Two level;Able to live on main level with bedroom/bathroom Home Equipment: None      Prior Function Level of Independence: Independent         Comments: working in a Teacher, adult education   Dominant Hand: Right    Extremity/Trunk Assessment   Upper Extremity Assessment Upper Extremity Assessment: Generalized weakness    Lower Extremity Assessment Lower Extremity Assessment: Generalized weakness    Cervical / Trunk Assessment Cervical / Trunk Assessment: Normal  Communication   Communication: No difficulties  Cognition Arousal/Alertness: Awake/alert Behavior During Therapy: WFL for tasks assessed/performed Overall Cognitive Status: Within Functional Limits for tasks assessed                                        General Comments General comments (  skin integrity, edema, etc.): VSS    Exercises     Assessment/Plan    PT Assessment Patient needs continued PT services  PT Problem List Decreased strength;Decreased activity tolerance;Decreased mobility;Decreased balance       PT Treatment Interventions DME instruction;Gait training;Functional mobility training;Stair training;Therapeutic activities;Therapeutic exercise;Balance training;Neuromuscular re-education     PT Goals (Current goals can be found in the Care Plan section)  Acute Rehab PT Goals Patient Stated Goal: feel better PT Goal Formulation: With patient Time For Goal Achievement: 12/15/20 Potential to Achieve Goals: Good Additional Goals Additional Goal #1: Pt to score >19 on DGI to indicate minimal falls risk.    Frequency Min 2X/week   Barriers to discharge        Co-evaluation               AM-PAC PT "6 Clicks" Mobility  Outcome Measure Help needed turning from your back to your side while in a flat bed without using bedrails?: None Help needed moving from lying on your back to sitting on the side of a flat bed without using bedrails?: None Help needed moving to and from a bed to a chair (including a wheelchair)?: None Help needed standing up from a chair using your arms (e.g., wheelchair or bedside chair)?: None Help needed to walk in hospital room?: A Little Help needed climbing 3-5 steps with a railing? : A Little 6 Click Score: 22    End of Session   Activity Tolerance: Patient tolerated treatment well Patient left: in chair;with call bell/phone within reach Nurse Communication: Mobility status (walk with pt in hallways, can walk with wife once disconnected from lines) PT Visit Diagnosis: Unsteadiness on feet (R26.81);Muscle weakness (generalized) (M62.81)    Time: 3419-3790 PT Time Calculation (min) (ACUTE ONLY): 29 min   Charges:   PT Evaluation $PT Eval Low Complexity: 1 Low PT Treatments $Gait Training: 8-22 mins        Lewis Shock, PT, DPT Acute Rehabilitation Services Pager #: [phone] Office #: [phone]   Iona Hansen 12/01/2020, 2:05 PM

## 2020-12-01 NOTE — Progress Notes (Addendum)
Patient called out for nurse to come to the room;entered room to find patient holding a tissue to his nose and reporting a new sudden drainage from his nares.  He is concerned it is CSF. Neurology paged and notified Dr. Thomasena Edis; awaiting orders; continue to monitor drainage with gauze pad.

## 2020-12-01 NOTE — Progress Notes (Addendum)
Wife states that patient information was obtained in error and if patient claimed he was not on Onfi, that he actually is and has been consistently for the past year.   Restarting Onfi at home dose of 20 mg po BID, first dose now. No rashes on documented exams.   Electronically signed: Dr. Caryl Pina

## 2020-12-01 NOTE — Progress Notes (Signed)
On coming RN to monitor amount of drainage and page neurology if amount increases. Neurology following over night.

## 2020-12-02 DIAGNOSIS — G039 Meningitis, unspecified: Secondary | ICD-10-CM | POA: Diagnosis not present

## 2020-12-02 DIAGNOSIS — G40919 Epilepsy, unspecified, intractable, without status epilepticus: Secondary | ICD-10-CM | POA: Diagnosis not present

## 2020-12-02 LAB — BASIC METABOLIC PANEL
Anion gap: 7 (ref 5–15)
BUN: 9 mg/dL (ref 6–20)
CO2: 23 mmol/L (ref 22–32)
Calcium: 8.2 mg/dL — ABNORMAL LOW (ref 8.9–10.3)
Chloride: 107 mmol/L (ref 98–111)
Creatinine, Ser: 0.82 mg/dL (ref 0.61–1.24)
GFR, Estimated: >60 mL/min (ref >60)
Glucose, Bld: 110 mg/dL — ABNORMAL HIGH (ref 70–99)
Potassium: 3.3 mmol/L — ABNORMAL LOW (ref 3.5–5.1)
Sodium: 137 mmol/L (ref 135–145)

## 2020-12-02 MED ORDER — POTASSIUM CHLORIDE CRYS ER 20 MEQ PO TBCR
40.0000 meq | EXTENDED_RELEASE_TABLET | Freq: Two times a day (BID) | ORAL | Status: AC
  Administered 2020-12-02 (×2): 40 meq via ORAL
  Filled 2020-12-02 (×2): qty 2

## 2020-12-02 MED ORDER — SODIUM CHLORIDE 0.9 % IV SOLN: INTRAVENOUS | Status: AC

## 2020-12-02 MED ORDER — SERTRALINE HCL 50 MG PO TABS
50.0000 mg | ORAL_TABLET | Freq: Every day | ORAL | Status: DC
  Administered 2020-12-02 – 2020-12-06 (×5): 50 mg via ORAL
  Filled 2020-12-02 (×5): qty 1

## 2020-12-02 NOTE — Plan of Care (Signed)
  Problem: Safety: Goal: Non-violent Restraint(s) Outcome: Progressing   Problem: Education: Goal: Knowledge of General Education information will improve Description: Including pain rating scale, medication(s)/side effects and non-pharmacologic comfort measures Outcome: Progressing   Problem: Health Behavior/Discharge Planning: Goal: Ability to manage health-related needs will improve Outcome: Progressing   Problem: Clinical Measurements: Goal: Ability to maintain clinical measurements within normal limits will improve Outcome: Progressing Goal: Will remain free from infection Outcome: Progressing Goal: Diagnostic test results will improve Outcome: Progressing   Problem: Activity: Goal: Risk for activity intolerance will decrease Outcome: Progressing   Problem: Pain Managment: Goal: General experience of comfort will improve Outcome: Progressing   Problem: Safety: Goal: Ability to remain free from injury will improve Outcome: Progressing   Problem: Skin Integrity: Goal: Risk for impaired skin integrity will decrease Outcome: Progressing

## 2020-12-02 NOTE — Progress Notes (Signed)
24-hour EEG showed no signs of seizures some beta activity probably due to benzos.  [facility] PROGRESS NOTE    Progress Note  Adam [name]  KGM:[id number] DOB: August 28, 1980 DOA: 11/28/2020 PCP: Patient, No Pcp Per (Inactive)     Brief Narrative:   Adam [name] is an 40 y.o. 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  As per the wife the patient has been compliant with his antiepileptic drug has not undergone any recent travel exposures or changes in her medication.  Due to her progressive weakness she decided to bring him to the ED where he exhibited tonic-clonic movement activity.  Patient was initially started empirically on Rocephin and vancomycin the case case was discussed with neurology recommended loading her up with IV Keppra and Vimpat.  CT of the head showed no acute cranial abnormalitie but it did showed extensive remote bilateral frontal craniotomy with chronic bilateral inferior frontal lobe encephalomalacia.   Assessment/Plan:   Breakthrough seizure ([place]) Breakthrough seizure in the ED with a known history of intractable epilepsy. She was loaded with Vimpat and Keppra. 24-hour EEG showed no signs of seizures. Continue empirically Vanco, cefepime and acyclovir, LP was xanthrochromic. Haldol for agitation appreciate neurology assistance.  Possible acute bacterial meningitis Met SIRS criteria in the ED with fever and leukocytosis. Culture data has remained negative till date. Continue acyclovir, IV Rocephin and vancomycin. Afebrile leukocytosis improving.  Clear nasal discharge: Was sent for an outside lab for beta-2 transferrin. Neurology recommended that if the drains continue to  get a CT cisternogram.   DVT prophylaxis: lovenox Family Communication:none Status is: Inpatient  Remains inpatient appropriate because: Acute severity of illness dictated the patient is inpatient and there is a concern for acute meningitis      Code Status:     Code Status Orders  (From admission, onward)           Start     Ordered   11/28/20 2130  Full code  Continuous        11/28/20 2130           Code Status History     Date Active Date Inactive Code Status Order ID Comments User Context   11/16/2018 1208 11/18/2018 2051 Full Code [id number]  Karmen Bongo, MD ED         IV Access:   Peripheral IV   Procedures and diagnostic studies:   No results found.   Medical Consultants:   None.   Subjective:    Rashan [name] has no new complaint except going to shower  Objective:    Vitals:   12/01/20 2015 12/02/20 0000 12/02/20 0338 12/02/20 0840  BP: 127/75 120/78 106/61 132/74  Pulse: 74 71 60 62  Resp: 20  19 19   Temp: 98.5 F (36.9 C) 98.4 F (36.9 C) 97.6 F (36.4 C) 98 F (36.7 C)  TempSrc: Oral Oral Oral Oral  SpO2: 98%  97% 100%  Weight:      Height:       SpO2: 100 %   Intake/Output Summary (Last 24 hours) at 12/02/2020 0917 Last data filed at 12/02/2020 0338 Gross per 24 hour  Intake 840 ml  Output 2950 ml  Net -2110 ml    Filed Weights   11/28/20 0451  Weight: 65.8 kg    Exam: General exam: In no acute distress. Respiratory system: Good air movement and clear to auscultation. Cardiovascular system: S1 & S2 heard, RRR. No JVD. Gastrointestinal system: Abdomen is nondistended, soft and nontender.  Extremities: No pedal edema. Skin: No rashes, lesions or ulcers  Data Reviewed:    Labs: Basic Metabolic Panel: Recent Labs  Lab 11/28/20 0449 11/29/20 0526 11/30/20 0137 12/01/20 0151 12/02/20 0401  NA 136 137 137 139 137  K 3.3* 3.6 3.6 3.5 3.3*  CL 95* 103 108 110 107  CO2 12* 23 21* 23 23   GLUCOSE 164* 105* 91 110* 110*  BUN 10 11 11 7 9   CREATININE 1.24 1.03 0.95 0.93 0.82  CALCIUM 10.2 8.9 8.2* 8.2* 8.2*  MG  --  2.0  --   --   --     GFR Estimated Creatinine Clearance: 113.7 mL/min (by C-G formula based on SCr of 0.82 mg/dL). Liver Function Tests: Recent Labs  Lab 11/28/20 0449 11/29/20 0526  AST 30 34  ALT 26 27  ALKPHOS 78 68  BILITOT 0.5 0.5  PROT 8.4* 7.5  ALBUMIN 4.8 3.4*    Recent Labs  Lab 11/28/20 0449  LIPASE 20    No results for input(s): AMMONIA in the last 168 hours. Coagulation profile No results for input(s): INR, PROTIME in the last 168 hours. COVID-19 Labs  No results for input(s): DDIMER, FERRITIN, LDH, CRP in the last 72 hours.  Lab Results  Component Value Date   SARSCOV2NAA NEGATIVE 11/28/2020   [place] NEGATIVE 10/22/2019   [place] NEGATIVE 11/16/2018    CBC: Recent Labs  Lab 11/28/20 0449 11/29/20 0526 11/30/20 0137  WBC 23.4* 27.8* 21.9*  NEUTROABS 20.4* 24.1* 17.2*  HGB 13.5 13.0 12.6*  HCT 42.0 38.0* 36.9*  MCV 92.5 87.2 87.4  PLT 265 216 160    Cardiac Enzymes: No results for input(s): CKTOTAL, CKMB, CKMBINDEX, TROPONINI in the last 168 hours. BNP (last 3 results) No results for input(s): PROBNP in the last 8760 hours. CBG: No results for input(s): GLUCAP in the last 168 hours. D-Dimer: No results for input(s): DDIMER in the last 72 hours. Hgb A1c: No results for input(s): HGBA1C in the last 72 hours. Lipid Profile: No results for input(s): CHOL, HDL, LDLCALC, TRIG, CHOLHDL, LDLDIRECT in the last 72 hours. Thyroid function studies: No results for input(s): TSH, T4TOTAL, T3FREE, THYROIDAB in the last 72 hours.  Invalid input(s): FREET3 Anemia work up: No results for input(s): VITAMINB12, FOLATE, FERRITIN, TIBC, IRON, RETICCTPCT in the last 72 hours. Sepsis Labs: Recent Labs  Lab 11/28/20 0449 11/29/20 0526 11/30/20 0137  WBC 23.4* 27.8* 21.9*    Microbiology Recent Results (from  the past 240 hour(s))  Resp Panel by RT-PCR (Flu A&B, Covid) Nasopharyngeal Swab     Status: None   Collection Time: 11/28/20  4:47 AM   Specimen: Nasopharyngeal Swab; Nasopharyngeal(NP) swabs in vial transport medium  Result Value Ref Range Status   SARS Coronavirus 2 by RT PCR NEGATIVE NEGATIVE Final    Comment: (NOTE) SARS-CoV-2 target nucleic acids are NOT DETECTED.  The SARS-CoV-2 RNA is generally detectable in upper respiratory specimens during the acute phase of infection. The lowest concentration of SARS-CoV-2 viral copies this assay can detect is 138 copies/mL. A negative result does not preclude SARS-Cov-2 infection and should not be used as the sole basis for treatment or other patient management decisions. A negative result may occur with  improper specimen collection/handling, submission of  specimen other than nasopharyngeal swab, presence of viral mutation(s) within the areas targeted by this assay, and inadequate number of viral copies(<138 copies/mL). A negative result must be combined with clinical observations, patient history, and epidemiological information. The expected result is Negative.  Fact Sheet for Patients:  EntrepreneurPulse.com.au  Fact Sheet for Healthcare Providers:  IncredibleEmployment.be  This test is no t yet approved or cleared by the [place] FDA and  has been authorized for detection and/or diagnosis of SARS-CoV-2 by FDA under an Emergency Use Authorization (EUA). This EUA will remain  in effect (meaning this test can be used) for the duration of the COVID-19 declaration under Section 564(b)(1) of the Act, 21 U.S.C.section 360bbb-3(b)(1), unless the authorization is terminated  or revoked sooner.       Influenza A by PCR NEGATIVE NEGATIVE Final   Influenza B by PCR NEGATIVE NEGATIVE Final    Comment: (NOTE) The Xpert Xpress SARS-CoV-2/FLU/RSV plus assay is intended as an aid in the diagnosis of  influenza from Nasopharyngeal swab specimens and should not be used as a sole basis for treatment. Nasal washings and aspirates are unacceptable for Xpert Xpress SARS-CoV-2/FLU/RSV testing.  Fact Sheet for Patients: EntrepreneurPulse.com.au  Fact Sheet for Healthcare Providers: IncredibleEmployment.be  This test is not yet approved or cleared by the [place] FDA and has been authorized for detection and/or diagnosis of SARS-CoV-2 by FDA under an Emergency Use Authorization (EUA). This EUA will remain in effect (meaning this test can be used) for the duration of the COVID-19 declaration under Section 564(b)(1) of the Act, 21 U.S.C. section 360bbb-3(b)(1), unless the authorization is terminated or revoked.  Performed at [facility], [address] [place], [place] [address]   CSF culture     Status: None   Collection Time: 11/28/20  6:21 AM   Specimen: CSF; Cerebrospinal Fluid  Result Value Ref Range Status   Specimen Description CSF  Final   Special Requests BACK  Final   Gram Stain   Final    WBC PRESENT, PREDOMINANTLY PMN NO ORGANISMS SEEN CYTOSPIN SMEAR    Culture   Final    NO GROWTH 3 DAYS Performed at [facility], [place] [address], [place], [place] [address]    Report Status 12/01/2020 FINAL  Final  Culture, blood (routine x 2)     Status: None (Preliminary result)   Collection Time: 11/28/20  6:45 AM   Specimen: BLOOD  Result Value Ref Range Status   Specimen Description   Final    BLOOD BOTTLES DRAWN AEROBIC AND ANAEROBIC Performed at [facility], [address], [place], [place] [address]    Special Requests   Final    Blood Culture adequate volume BLOOD LEFT WRIST Performed at [facility], [address], [place], [place] [address]    Culture   Final    NO GROWTH 3 DAYS Performed at [facility], [place] [address], [place], [place] [address]     Report Status PENDING  Incomplete  Culture, blood (routine x 2)     Status: None (Preliminary result)   Collection Time: 11/28/20  6:48 AM   Specimen: BLOOD  Result Value Ref Range Status   Specimen Description   Final    BLOOD BOTTLES DRAWN AEROBIC AND ANAEROBIC Performed at [facility], [address], [place], [place] [address]    Special Requests   Final    Blood Culture adequate volume BLOOD LEFT WRIST Performed at [facility], [address], [place], [place] [address]    Culture   Final    NO GROWTH 3 DAYS Performed at [facility], [place] [address], [place], [place] [address]    Report Status PENDING  Incomplete  Culture, blood (single) w Reflex to ID Panel     Status: None (Preliminary result)   Collection Time: 11/29/20  5:26 AM   Specimen: BLOOD LEFT ARM  Result Value Ref Range Status   Specimen Description BLOOD LEFT ARM  Final   Special Requests AEROBIC BOTTLE ONLY Blood Culture adequate volume  Final   Culture   Final    NO GROWTH 2 DAYS Performed at [facility], [address] [place], [place]  [address]    Report Status PENDING  Incomplete     Medications:    cloBAZam  20 mg Oral BID   enoxaparin (LOVENOX) injection  40 mg Subcutaneous Q24H   lidocaine (PF)  10 mL Infiltration Once   Continuous Infusions:  sodium chloride 125 mL/hr at 12/02/20 0445   acyclovir 660 mg (12/02/20 0517)   cefTRIAXone (ROCEPHIN)  IV 2 g (12/02/20 0009)   lacosamide (VIMPAT) IV 300 mg (12/01/20 2243)   levETIRAcetam 2,500 mg (12/02/20 0050)   vancomycin 1,000 mg (12/02/20 0200)      LOS: 4 days   Charlynne Cousins  [facility]  12/02/2020, 9:17 AM

## 2020-12-02 NOTE — Progress Notes (Signed)
Neurology Progress Note  Brief HPI: 40 y.o. male with a PMHx of intractable epilepsy 2/2 meningitis at age 61 s/p vagus nerve stimulator placement 04/2019, prior nasal sinus surgery, remote bilateral frontal craniotomy/cranioplasty, chronic bilateral inferior frontal lobe encephalomalacia, seizures (on Vimpat and Keppra at home) history of nonconvulsive status epilepticus, marijuana use, anxiety, and depression who presented to [facility] 10/19 for evaluation of one day of progressive lethargy and headaches with witnessed seizure by spouse en route to the ED for evaluation. Initial work up in the ED revealed fever of 100.21F with tachycardia, CBC with leukocytosis, and CSF with findings consistent with meningitis. The patient was started on empiric antibiotics while awaiting culture results.   Interval History: Patient's wife clarified that patient does take Onfi at home and home dose was restarted 10/22  Subjective: Overnight patient was noted to have clear fluid drainage from his nose with concern for CSF leak. Specimen was collected on gauze with a Beta-2 Transferrin [facility] lab send out.   Exam: Vitals:   12/02/20 0338 12/02/20 0840  BP: 106/61 132/74  Pulse: 60 62  Resp: 19 19  Temp: 97.6 F (36.4 C) 98 F (36.7 C)  SpO2: 97% 100%   Gen: Laying comfortably in bed, in no acute distress Resp: non-labored breathing, no respiratory distress Abd: soft, non-tender, non-distended  Neuro: Mental Status: Asleep initially, wakes as examiner enters the room. He is alert and oriented x 4. Speech is intact without dysarthria.  He is able to provide clear and coherent details regarding present illness. No aphasia or neglect noted.  Cranial Nerves: PERRL, EOMI without ptosis, VFF, facial sensation to light touch is intact and symmetric, face is symmetric resting and smiling, hearing is intact to voice, phonation intact, shoulders shrug symmetrically, tongue is midline.  Motor: 5/5 strength  throughout without vertical drift. Tone and bulk are normal.  Sensory: Intact and symmetric to light touch in bilateral upper and lower extremities.  DTR: 2+ and symmetric patellae and biceps Plantars: Toes downgoing bilaterally Gait: Deferred  Pertinent Labs: CBC    Component Value Date/Time   WBC 21.9 (H) 11/30/2020 0137   RBC 4.22 11/30/2020 0137   HGB 12.6 (L) 11/30/2020 0137   HCT 36.9 (L) 11/30/2020 0137   PLT 160 11/30/2020 0137   MCV 87.4 11/30/2020 0137   MCH 29.9 11/30/2020 0137   MCHC 34.1 11/30/2020 0137   RDW 13.2 11/30/2020 0137   LYMPHSABS 2.5 11/30/2020 0137   MONOABS 2.0 (H) 11/30/2020 0137   EOSABS 0.0 11/30/2020 0137   BASOSABS 0.0 11/30/2020 0137   CMP     Component Value Date/Time   NA 137 12/02/2020 0401   K 3.3 (L) 12/02/2020 0401   CL 107 12/02/2020 0401   CO2 23 12/02/2020 0401   GLUCOSE 110 (H) 12/02/2020 0401   BUN 9 12/02/2020 0401   CREATININE 0.82 12/02/2020 0401   CALCIUM 8.2 (L) 12/02/2020 0401   PROT 7.5 11/29/2020 0526   ALBUMIN 3.4 (L) 11/29/2020 0526   AST 34 11/29/2020 0526   ALT 27 11/29/2020 0526   ALKPHOS 68 11/29/2020 0526   BILITOT 0.5 11/29/2020 0526   GFRNONAA >60 12/02/2020 0401   GFRAA >60 11/18/2018 0303   Tube #   4  1 CM   Comment: Performed at [facility], [address] [place], [place] [address]  Color, CSF COLORLESS YELLOW Abnormal   YELLOW Abnormal    Appearance, CSF CLEAR CLOUDY Abnormal   CLOUDY Abnormal    Supernatant  XANTHOCHROMIC  XANTHOCHROMIC   RBC Count, CSF 0 /cu mm 38 High   64 High    WBC, CSF 0 - 5 /cu mm 2,855 High Panic   4,743 High Panic  CM   Comment: CRITICAL RESULT CALLED TO, READ BACK BY AND VERIFIED WITH:  BARWICK,J RN @ 1050 11/28/20 LEONARD,A   Segmented Neutrophils-CSF 0 - 6 % 97 High   96 High    Lymphs, CSF 40 - 80 % 2 Low   2 Low    Monocyte-Macrophage-Spinal Fluid 15 - 45 % 1 Low   2 Low    Eosinophils, CSF 0 - 1 % 0  0 CM     Glucose, CSF 40 - 70 mg/dL  <53 Low Panic      Total  Protein, CSF 15 - 45 mg/dL >614 High      HSV-1 DNA Negative Negative   HSV-2 DNA Negative Negative    Drugs of Abuse     Component Value Date/Time   LABOPIA NONE DETECTED 11/28/2020 2253   COCAINSCRNUR NONE DETECTED 11/28/2020 2253   LABBENZ POSITIVE (A) 11/28/2020 2253   AMPHETMU NONE DETECTED 11/28/2020 2253   THCU POSITIVE (A) 11/28/2020 2253   LABBARB NONE DETECTED 11/28/2020 2253    Blood cultures: No growth 4 days Specimen Description CSF   Special Requests BACK   Gram Stain WBC PRESENT, PREDOMINANTLY PMN  NO ORGANISMS SEEN  CYTOSPIN SMEAR   Culture NO GROWTH 3 DAYS    10/19 Levetiracetam Lvl 10.0 - 40.0 ug/mL 14.0    Imaging Reviewed: No new imaging    Assessment: 40 y.o. male with a PMHx of intractable epilepsy with febrile illness and CSF suggesting meningitis and breakthrough seizure.  - Examination reveals patient without complaints of ongoing headache and reports resolution of nasal drainage with minimal nasal dripping on waking this morning. He states that the bedside nurse provided him gauze to wipe his drainage but he does not feel that he has enough to collect on the gauze.  - Presentation is most consistent with a breakthrough seizure that was provoked in the setting of meningitis with CSF findings of xanthochromia with a neutrophilic predominant severe pleocytosis, cloudy appearance, low glucose, and severely elevated protein. He has been started on empiric meningitis coverage which will be modified pending CSF culture results.  - CSF bacterial culture: No growth x 3 days - CSF gram stain: No organisms seen - Blood cultures x 2: No growth to date   Recommendations: - MRI brain with and without contrast ordered, pending - Continue home antiepileptic medications: Keppra, lacosamide, and Onfi - Pending Beta-2 Transferrin [facility] lab send out - Continue meningitis coverage; ceftriaxone, vancomycin and acyclovir per primary team -  Inpatient seizure precautions - 2 mg IV Ativan PRN for any seizure lasting > 5 minutes and notify neurology  Lanae Boast, AGACNP-BC [facility] [phone]  Electronically signed: Dr. Caryl Pina

## 2020-12-02 NOTE — Plan of Care (Signed)
Brief Neurology plan of care  Patient had clear fluid drainage from nose and he was concerned about CSF leak.  Clear nasal drainage on tissue and gauze.  Unable to do pledget study and collected gauze in urine specimen cup.  Ordered: Beta-2 Transferrin ([id number] [facility])  If the drainage continues will consider CT cisternogram and neurosurgery consult.     Marisue Humble, MD Stroke Neurology Page: [phone]

## 2020-12-03 DIAGNOSIS — G039 Meningitis, unspecified: Secondary | ICD-10-CM | POA: Diagnosis not present

## 2020-12-03 LAB — CULTURE, BLOOD (ROUTINE X 2)
Culture: NO GROWTH
Culture: NO GROWTH
Special Requests: ADEQUATE
Special Requests: ADEQUATE

## 2020-12-03 LAB — VANCOMYCIN, TROUGH: Vancomycin Tr: 20 ug/mL (ref 15–20)

## 2020-12-03 NOTE — Progress Notes (Signed)
Brief Neuro Update:  No charge note.  HSV PCR is negative, I am discontinuing Acyclovir. CSF gram stain also demonstrated predominantly PMNs which would also be inconsistent with a viral meningitis.  Erick Blinks [facility] Pager Number [phone]

## 2020-12-03 NOTE — Plan of Care (Signed)
  Problem: Safety: Goal: Non-violent Restraint(s) Outcome: Progressing   Problem: Education: Goal: Knowledge of General Education information will improve Description: Including pain rating scale, medication(s)/side effects and non-pharmacologic comfort measures Outcome: Progressing   Problem: Health Behavior/Discharge Planning: Goal: Ability to manage health-related needs will improve Outcome: Progressing   Problem: Clinical Measurements: Goal: Ability to maintain clinical measurements within normal limits will improve Outcome: Progressing Goal: Will remain free from infection Outcome: Progressing Goal: Diagnostic test results will improve Outcome: Progressing   Problem: Activity: Goal: Risk for activity intolerance will decrease Outcome: Progressing   Problem: Pain Managment: Goal: General experience of comfort will improve Outcome: Progressing   Problem: Safety: Goal: Ability to remain free from injury will improve Outcome: Progressing   Problem: Skin Integrity: Goal: Risk for impaired skin integrity will decrease Outcome: Progressing   

## 2020-12-03 NOTE — Progress Notes (Signed)
24-hour EEG showed no signs of seizures some beta activity probably due to benzos.  [facility] PROGRESS NOTE    Progress Note  Adam [name]  MPN:[id number] DOB: [birth year] DOA: 11/28/2020 PCP: Patient, No Pcp Per (Inactive)     Brief Narrative:   Adam [name] is an 40 y.o. male past medical history of intractable epilepsy, status post vagus nerve stimulation on March 2021, history of nonconvulsive status epilepticus, cannabis use, depression who presented to [place] emergency for 1 day of progressive worsening headache lethargy and seizure activity reported by the patient's spouse.  As per the wife the patient has been compliant with his antiepileptic drug has not undergone any recent travel exposures or changes in her medication.  Due to her progressive weakness she decided to bring him to the ED where he exhibited tonic-clonic movement activity.  Patient was initially started empirically on Rocephin and vancomycin the case case was discussed with neurology recommended loading her up with IV Keppra and Vimpat.  CT of the head showed no acute cranial abnormalitie but it did showed extensive remote bilateral frontal craniotomy with chronic bilateral inferior frontal lobe encephalomalacia.   Assessment/Plan:   Breakthrough seizure ([place]) Breakthrough seizure in the ED with a known history of intractable epilepsy. She was loaded with Onfi, Vimpat and Keppra. 24-hour EEG showed no signs of seizures. Continue empirically Vanco, cefepime and acyclovir, LP was xanthrochromic. Haldol for agitation appreciate neurology assistance. MRI is pending the patient has a VSN system, which will had to be turned off before MRI can be done scheduled for 12/03/2020  Possible acute bacterial meningitis Met SIRS criteria in the ED with fever and leukocytosis. Culture data has remained negative till date. Continue acyclovir, IV Rocephin and vancomycin. Treatment will be completed  tomorrow 12/05/2019 2.  Clear nasal discharge: Was sent for an outside lab for beta-2 transferrin. Neurology recommended that if the drains continue to get a CT cisternogram.   DVT prophylaxis: lovenox Family Communication:none Status is: Inpatient  Remains inpatient appropriate because: Acute severity of illness dictated the patient is inpatient and there is a concern for acute meningitis      Code Status:     Code Status Orders  (From admission, onward)           Start     Ordered   11/28/20 2130  Full code  Continuous        11/28/20 2130           Code Status History     Date Active Date Inactive Code Status Order ID Comments User Context   11/16/2018 1208 11/18/2018 2051 Full Code [id number]  Karmen Bongo, MD ED         IV Access:   Peripheral IV   Procedures and diagnostic studies:   No results found.   Medical Consultants:   None.   Subjective:    Adam [name] has no new complaints  Objective:    Vitals:   12/02/20 1604 12/02/20 2020 12/03/20 0017 12/03/20 0500  BP: 114/66 (!) 109/59 126/74 109/67  Pulse: 66 70 71 63  Resp: _0 Temp: 98.2 F (36.8 C) 99.4 F (37.4 C) 99.1 F (37.3 C) 98.3 F (36.8 C)  TempSrc: Oral Oral Oral Axillary  SpO2: 100% 100% 100% 99%  Weight:      Height:       SpO2: 99 %   Intake/Output Summary (Last 24 hours) at 12/03/2020 0744 Last data filed at 12/03/2020 0012  Gross per 24 hour  Intake 245 ml  Output 1400 ml  Net -1155 ml    Filed Weights   11/28/20 0451  Weight: 65.8 kg    Exam: General exam: In no acute distress. Respiratory system: Good air movement and clear to auscultation. Cardiovascular system: S1 & S2 heard, RRR. No JVD. Gastrointestinal system: Abdomen is nondistended, soft and nontender.  Extremities: No pedal edema. Skin: No rashes, lesions or ulcers Psychiatry: Judgement and insight appear normal. Mood & affect appropriate. Data Reviewed:     Labs: Basic Metabolic Panel: Recent Labs  Lab 11/28/20 0449 11/29/20 0526 11/30/20 0137 12/01/20 0151 12/02/20 0401  NA 136 137 137 139 137  K 3.3* 3.6 3.6 3.5 3.3*  CL 95* 103 108 110 107  CO2 12* 23 21* 23 23  GLUCOSE 164* 105* 91 110* 110*  BUN _0 CREATININE 1.24 1.03 0.95 0.93 0.82  CALCIUM 10.2 8.9 8.2* 8.2* 8.2*  MG  --  2.0  --   --   --     GFR Estimated Creatinine Clearance: 113.7 mL/min (by C-G formula based on SCr of 0.82 mg/dL). Liver Function Tests: Recent Labs  Lab 11/28/20 0449 11/29/20 0526  AST 30 34  ALT 26 27  ALKPHOS 78 68  BILITOT 0.5 0.5  PROT 8.4* 7.5  ALBUMIN 4.8 3.4*    Recent Labs  Lab 11/28/20 0449  LIPASE 20    No results for input(s): AMMONIA in the last 168 hours. Coagulation profile No results for input(s): INR, PROTIME in the last 168 hours. COVID-19 Labs  No results for input(s): DDIMER, FERRITIN, LDH, CRP in the last 72 hours.  Lab Results  Component Value Date   SARSCOV2NAA NEGATIVE 11/28/2020   [place] NEGATIVE 10/22/2019   Manuel Garcia NEGATIVE 11/16/2018    CBC: Recent Labs  Lab 11/28/20 0449 11/29/20 0526 11/30/20 0137  WBC 23.4* 27.8* 21.9*  NEUTROABS 20.4* 24.1* 17.2*  HGB 13.5 13.0 12.6*  HCT 42.0 38.0* 36.9*  MCV 92.5 87.2 87.4  PLT 265 216 160    Cardiac Enzymes: No results for input(s): CKTOTAL, CKMB, CKMBINDEX, TROPONINI in the last 168 hours. BNP (last 3 results) No results for input(s): PROBNP in the last 8760 hours. CBG: No results for input(s): GLUCAP in the last 168 hours. D-Dimer: No results for input(s): DDIMER in the last 72 hours. Hgb A1c: No results for input(s): HGBA1C in the last 72 hours. Lipid Profile: No results for input(s): CHOL, HDL, LDLCALC, TRIG, CHOLHDL, LDLDIRECT in the last 72 hours. Thyroid function studies: No results for input(s): TSH, T4TOTAL, T3FREE, THYROIDAB in the last 72 hours.  Invalid input(s): FREET3 Anemia work up: No results for  input(s): VITAMINB12, FOLATE, FERRITIN, TIBC, IRON, RETICCTPCT in the last 72 hours. Sepsis Labs: Recent Labs  Lab 11/28/20 0449 11/29/20 0526 11/30/20 0137  WBC 23.4* 27.8* 21.9*    Microbiology Recent Results (from the past 240 hour(s))  Resp Panel by RT-PCR (Flu A&B, Covid) Nasopharyngeal Swab     Status: None   Collection Time: 11/28/20  4:47 AM   Specimen: Nasopharyngeal Swab; Nasopharyngeal(NP) swabs in vial transport medium  Result Value Ref Range Status   SARS Coronavirus 2 by RT PCR NEGATIVE NEGATIVE Final    Comment: (NOTE) SARS-CoV-2 target nucleic acids are NOT DETECTED.  The SARS-CoV-2 RNA is generally detectable in upper respiratory specimens during the acute phase of infection. The lowest concentration of SARS-CoV-2 viral copies this assay can detect is 138 copies/mL. A  negative result does not preclude SARS-Cov-2 infection and should not be used as the sole basis for treatment or other patient management decisions. A negative result may occur with  improper specimen collection/handling, submission of specimen other than nasopharyngeal swab, presence of viral mutation(s) within the areas targeted by this assay, and inadequate number of viral copies(<138 copies/mL). A negative result must be combined with clinical observations, patient history, and epidemiological information. The expected result is Negative.  Fact Sheet for Patients:  EntrepreneurPulse.com.au  Fact Sheet for Healthcare Providers:  IncredibleEmployment.be  This test is no t yet approved or cleared by the [place] FDA and  has been authorized for detection and/or diagnosis of SARS-CoV-2 by FDA under an Emergency Use Authorization (EUA). This EUA will remain  in effect (meaning this test can be used) for the duration of the COVID-19 declaration under Section 564(b)(1) of the Act, 21 U.S.C.section 360bbb-3(b)(1), unless the authorization is terminated  or  revoked sooner.       Influenza A by PCR NEGATIVE NEGATIVE Final   Influenza B by PCR NEGATIVE NEGATIVE Final    Comment: (NOTE) The Xpert Xpress SARS-CoV-2/FLU/RSV plus assay is intended as an aid in the diagnosis of influenza from Nasopharyngeal swab specimens and should not be used as a sole basis for treatment. Nasal washings and aspirates are unacceptable for Xpert Xpress SARS-CoV-2/FLU/RSV testing.  Fact Sheet for Patients: EntrepreneurPulse.com.au  Fact Sheet for Healthcare Providers: IncredibleEmployment.be  This test is not yet approved or cleared by the [place] FDA and has been authorized for detection and/or diagnosis of SARS-CoV-2 by FDA under an Emergency Use Authorization (EUA). This EUA will remain in effect (meaning this test can be used) for the duration of the COVID-19 declaration under Section 564(b)(1) of the Act, 21 U.S.C. section 360bbb-3(b)(1), unless the authorization is terminated or revoked.  Performed at [facility], [address], [place], [place] [address]   CSF culture     Status: None   Collection Time: 11/28/20  6:21 AM   Specimen: CSF; Cerebrospinal Fluid  Result Value Ref Range Status   Specimen Description CSF  Final   Special Requests BACK  Final   Gram Stain   Final    WBC PRESENT, PREDOMINANTLY PMN NO ORGANISMS SEEN CYTOSPIN SMEAR    Culture   Final    NO GROWTH 3 DAYS Performed at [facility], [place] [address] [place], [place] [address]    Report Status 12/01/2020 FINAL  Final  Culture, blood (routine x 2)     Status: None (Preliminary result)   Collection Time: 11/28/20  6:45 AM   Specimen: BLOOD  Result Value Ref Range Status   Specimen Description   Final    BLOOD BOTTLES DRAWN AEROBIC AND ANAEROBIC Performed at [facility], [address] [place], [place] [address]    Special Requests   Final    Blood Culture adequate volume BLOOD  LEFT WRIST Performed at [facility], [address], [place], [place] [address]    Culture   Final    NO GROWTH 4 DAYS Performed at [facility], [place] [address] [place], [place] [address]    Report Status PENDING  Incomplete  Culture, blood (routine x 2)     Status: None (Preliminary result)   Collection Time: 11/28/20  6:48 AM   Specimen: BLOOD  Result Value Ref Range Status   Specimen Description   Final    BLOOD BOTTLES DRAWN AEROBIC AND  ANAEROBIC Performed at [facility], [address], [place], [place] [address]    Special Requests   Final    Blood Culture adequate volume BLOOD LEFT WRIST Performed at [facility], [address] [place], [place] [address]    Culture   Final    NO GROWTH 4 DAYS Performed at [facility], [place] [address] [place], [place], [place] [address]    Report Status PENDING  Incomplete  Culture, blood (single) w Reflex to ID Panel     Status: None (Preliminary result)   Collection Time: 11/29/20  5:26 AM   Specimen: BLOOD LEFT ARM  Result Value Ref Range Status   Specimen Description BLOOD LEFT ARM  Final   Special Requests AEROBIC BOTTLE ONLY Blood Culture adequate volume  Final   Culture   Final    NO GROWTH 3 DAYS Performed at [facility], [address] [place], [place] [address]    Report Status PENDING  Incomplete     Medications:    cloBAZam  20 mg Oral BID   enoxaparin (LOVENOX) injection  40 mg Subcutaneous Q24H   lidocaine (PF)  10 mL Infiltration Once   sertraline  50 mg Oral Daily   Continuous Infusions:  sodium chloride 50 mL/hr at 12/02/20 1113   acyclovir 660 mg (12/03/20 0124)   cefTRIAXone (ROCEPHIN)  IV 2 g (12/02/20 2254)   lacosamide (VIMPAT) IV 300 mg (12/02/20 2132)   levETIRAcetam 2,500 mg (12/03/20 0012)   vancomycin 1,000 mg (12/03/20 0301)      LOS: 5 days   Charlynne Cousins  [facility]  12/03/2020, 7:44 AM

## 2020-12-03 NOTE — TOC Initial Note (Signed)
Transition of Care [facility]) - Initial/Assessment Note    Patient Details  Name: Adam [name] MRN: [id number] Date of Birth: 1980-03-05  [facility]) CM/SW Contact:    Kermit Balo, RN Phone Number: 12/03/2020, 12:24 PM  Clinical Narrative:                 Patient is from home with spouse who can provide needed supervision. Pt states he uses Dr Baldo Daub as his PCP.  No issues with home medications or with transportation.  TOC following.  Expected Discharge Plan: Home/Self Care Barriers to Discharge: Continued Medical Work up   Patient Goals and CMS Choice        Expected Discharge Plan and Services Expected Discharge Plan: Home/Self Care   Discharge Planning Services: CM Consult   Living arrangements for the past 2 months: Single Family Home                                      Prior Living Arrangements/Services Living arrangements for the past 2 months: Single Family Home Lives with:: Spouse Patient language and need for interpreter reviewed:: Yes Do you feel safe going back to the place where you live?: Yes        Care giver support system in place?: Yes (comment)   Criminal Activity/Legal Involvement Pertinent to Current Situation/Hospitalization: No - Comment as needed  Activities of Daily Living      Permission Sought/Granted                  Emotional Assessment Appearance:: Appears stated age Attitude/Demeanor/Rapport: Engaged Affect (typically observed): Accepting Orientation: : Oriented to Self, Oriented to Place, Oriented to  Time, Oriented to Situation   Psych Involvement: No (comment)  Admission diagnosis:  Breakthrough seizure (HCC) [G40.919] Fever, unspecified fever cause [R50.9] Patient Active Problem List   Diagnosis Date Noted   Breakthrough seizure (HCC) 11/28/2020   Acute bacterial meningitis 11/28/2020   S/P placement of VNS (vagus nerve stimulation) device 05/18/2019   Status epilepticus (HCC) 11/16/2018    Marijuana abuse 11/16/2018   Encephalomalacia on imaging study 10/01/2018   Partial epilepsy with impairment of consciousness (HCC) 09/14/2017   PCP:  Patient, No Pcp Per (Inactive) Pharmacy:   [id number] [place], [place] - [address] [place] [address] Phone: [phone] Fax: [phone]     Social Determinants of Health (SDOH) Interventions    Readmission Risk Interventions No flowsheet data found.

## 2020-12-03 NOTE — Plan of Care (Signed)
Pt is alert oriented x 4. Seizure precautions in place.  Pt uses urinal at bedside. Pt has continuous IV fluids.    Problem: Safety: Goal: Non-violent Restraint(s) Outcome: Progressing   Problem: Education: Goal: Knowledge of General Education information will improve Description: Including pain rating scale, medication(s)/side effects and non-pharmacologic comfort measures Outcome: Progressing   Problem: Health Behavior/Discharge Planning: Goal: Ability to manage health-related needs will improve Outcome: Progressing   Problem: Clinical Measurements: Goal: Ability to maintain clinical measurements within normal limits will improve Outcome: Progressing Goal: Will remain free from infection Outcome: Progressing Goal: Diagnostic test results will improve Outcome: Progressing   Problem: Activity: Goal: Risk for activity intolerance will decrease Outcome: Progressing   Problem: Pain Managment: Goal: General experience of comfort will improve Outcome: Progressing   Problem: Safety: Goal: Ability to remain free from injury will improve Outcome: Progressing   Problem: Skin Integrity: Goal: Risk for impaired skin integrity will decrease Outcome: Progressing

## 2020-12-03 NOTE — Progress Notes (Signed)
Pharmacy Antibiotic Note  Adam [name] is a 40 y.o. male admitted on 11/28/2020 with meningitis.  Pharmacy has been consulted for Vanco, Acyclovir dosing.  ID: D6/14 abx for r/o  meningitis Tmax 99.5, WBC 21.9 down some - marked acute inflammation on pathologist smear review 10/19 - LP 10/19: cloudy, yellow, WBCs up 4743 and 2855, protein >600, RBCs 38 and 64 - HSV PCR negative  CTX 10/19 >> (no stop date in place) Vanc 10/19 >> (11/2) Acyclovir 10/19 >> plan 11/2 - stop date NOT in place  10/19 CSF: NGTD 10/19 COVID and flu: neg 10/19 blood: NGTD 10/19 HSV PCR: neg 10/19 HIV: non-reactive  Vancomycin at q8h. Vancomycin level drawn at 6.5 hours after dose = 20 mcg/ml (adequate for meningitis, will be slightly lower at true trough)  Plan: Acyclovir 660mg  (10mg /kg) q8h (x 14 days) - HSV negative, could d/c Continue Vanco to 1000mg  IV q8  CTX 2gm IV q12h     Height: 5\' 11"  (180.3 cm) Weight: 65.8 kg (145 lb) IBW/kg (Calculated) : 75.3  Temp (24hrs), Avg:98.6 F (37 C), Min:97.8 F (36.6 C), Max:99.4 F (37.4 C)  Recent Labs  Lab 11/28/20 0449 11/29/20 0526 11/30/20 0137 12/01/20 0151 12/02/20 0401 12/03/20 0929  WBC 23.4* 27.8* 21.9*  --   --   --   CREATININE 1.24 1.03 0.95 0.93 0.82  --   VANCOTROUGH  --   --   --   --   --  20     Estimated Creatinine Clearance: 113.7 mL/min (by C-G formula based on SCr of 0.82 mg/dL).    No Known Allergies  Clester Chlebowski A. 12/02/20, PharmD, BCPS, FNKF Clinical Pharmacist [place] Please utilize Amion for appropriate phone number to reach the unit pharmacist [facility])    12/04/20 12/03/2020 10:37 AM

## 2020-12-04 ENCOUNTER — Inpatient Hospital Stay (HOSPITAL_COMMUNITY): Payer: Medicare Other

## 2020-12-04 DIAGNOSIS — G039 Meningitis, unspecified: Secondary | ICD-10-CM | POA: Diagnosis not present

## 2020-12-04 LAB — BASIC METABOLIC PANEL
Anion gap: 9 (ref 5–15)
BUN: 6 mg/dL (ref 6–20)
CO2: 25 mmol/L (ref 22–32)
Calcium: 9 mg/dL (ref 8.9–10.3)
Chloride: 103 mmol/L (ref 98–111)
Creatinine, Ser: 0.82 mg/dL (ref 0.61–1.24)
GFR, Estimated: >60 mL/min (ref >60)
Glucose, Bld: 88 mg/dL (ref 70–99)
Potassium: 4.1 mmol/L (ref 3.5–5.1)
Sodium: 137 mmol/L (ref 135–145)

## 2020-12-04 LAB — CULTURE, BLOOD (SINGLE)
Culture: NO GROWTH
Special Requests: ADEQUATE

## 2020-12-04 MED ORDER — GADOBUTROL 1 MMOL/ML IV SOLN
6.0000 mL | Freq: Once | INTRAVENOUS | Status: AC | PRN
  Administered 2020-12-04: 6 mL via INTRAVENOUS

## 2020-12-04 MED ORDER — VANCOMYCIN HCL IN DEXTROSE 1-5 GM/200ML-% IV SOLN
1000.0000 mg | Freq: Three times a day (TID) | INTRAVENOUS | Status: DC
Start: 2020-12-04 — End: 2020-12-06
  Administered 2020-12-04 – 2020-12-06 (×7): 1000 mg via INTRAVENOUS
  Filled 2020-12-04 (×9): qty 200

## 2020-12-04 MED ORDER — SODIUM CHLORIDE 0.9 % IV SOLN
2.0000 g | Freq: Two times a day (BID) | INTRAVENOUS | Status: DC
  Administered 2020-12-04 – 2020-12-06 (×5): 2 g via INTRAVENOUS
  Filled 2020-12-04 (×5): qty 20

## 2020-12-04 NOTE — Care Plan (Addendum)
VNS was interrogated and turned off by me on 12/04/2020 at 1150.   Initial parameters   Normal [employer] Magnet  Output 0.821mA 73mA 1.197mA  Frequency 20Hz     Pulse Width 250usec 250usec 250usec  On time 30 sec 30 sec 30 sec  Off time 5 min    Duty cycle 10%      After turning off VNS   Normal [employer] Magnet  Output 9mA 50mA 45mA  Frequency 20Hz     Pulse Width 250usec 250usec 250usec  On time 30 sec 30 sec 30 sec  Off time 5 min    Duty cycle 10%      Vns was turned back on at previous settings on 12/04/2020 at 1600   Normal [employer] Magnet  Output 0.821mA 29mA 1.179mA  Frequency 20Hz     Pulse Width 250usec 250usec 250usec  On time 30 sec 30 sec 30 sec  Off time 5 min    Duty cycle 10%       Allie Gerhold 0m

## 2020-12-04 NOTE — Progress Notes (Addendum)
24-hour EEG showed no signs of seizures some beta activity probably due to benzos.  [facility] PROGRESS NOTE    Progress Note  Adam [name]  SLP:[id number] DOB: Sep 01, 1980 DOA: 11/28/2020 PCP: Patient, No Pcp Per (Inactive)     Brief Narrative:   Adam [name] is an 40 y.o. male past medical history of intractable epilepsy, status post vagus nerve stimulation on March 2021, history of nonconvulsive status epilepticus, cannabis use, depression who presented to [place] emergency for 1 day of progressive worsening headache lethargy and seizure activity reported by the patient's spouse.  As per the wife the patient has been compliant with his antiepileptic drug has not undergone any recent travel exposures or changes in her medication.  Due to her progressive weakness she decided to bring him to the ED where he exhibited tonic-clonic movement activity.  Patient was initially started empirically on Rocephin and vancomycin the case case was discussed with neurology recommended loading her up with IV Keppra and Vimpat.  CT of the head showed no acute cranial abnormalitie but it did showed extensive remote bilateral frontal craniotomy with chronic bilateral inferior frontal lobe encephalomalacia.   Assessment/Plan:   Breakthrough seizure ([place]) Breakthrough seizure in the ED with a known history of intractable epilepsy. He was loaded with Onfi, Vimpat and Keppra. 24-hour EEG showed no signs of seizures. LP was xanthrochromic. Haldol for agitation appreciate neurology assistance. MRI is pending the patient has a VSN system, which will had to be turned off before MRI can be done scheduled for 12/03/2020  Possible acute bacterial meningitis Met SIRS criteria in the ED with fever and leukocytosis. Culture data has remained negative till date. Continue IV Rocephin and azithromycin empirically for 14 days. HSV PCR was negative acyclovir was discontinued 2 days ago.  Clear  nasal discharge: Was sent for an outside lab for beta-2 transferrin. Neurology recommended that if the drains continue to get a CT cisternogram.   DVT prophylaxis: lovenox Family Communication:none Status is: Inpatient  Remains inpatient appropriate because: Acute severity of illness dictated the patient is inpatient and there is a concern for acute meningitis      Code Status:     Code Status Orders  (From admission, onward)           Start     Ordered   11/28/20 2130  Full code  Continuous        11/28/20 2130           Code Status History     Date Active Date Inactive Code Status Order ID Comments User Context   11/16/2018 1208 11/18/2018 2051 Full Code [id number]  Karmen Bongo, MD ED         IV Access:   Peripheral IV   Procedures and diagnostic studies:   No results found.   Medical Consultants:   None.   Subjective:    Adam [name] no new complaints general exam: In no acute distress. Respiratory system: Good air movement and clear to auscultation. Cardiovascular system: S1 & S2 heard, RRR. No JVD. Gastrointestinal system: Abdomen is nondistended, soft and nontender.  Psychiatry: Judgement and insight appear normal. Mood & affect appropriate.  Objective:    Vitals:   12/03/20 2200 12/04/20 0052 12/04/20 0343 12/04/20 0806  BP: 116/74 120/72 112/70 110/65  Pulse: 70 61 (!) 58 64  Resp: 16 18 16 14   Temp: 99.3 F (37.4 C) 99.3 F (37.4 C) 99 F (37.2 C)   TempSrc: Oral Oral Oral  SpO2: 100% 100% 100% 100%  Weight:      Height:       SpO2: 100 %   Intake/Output Summary (Last 24 hours) at 12/04/2020 0943 Last data filed at 12/04/2020 0341 Gross per 24 hour  Intake 910 ml  Output 5050 ml  Net -4140 ml    Filed Weights   11/28/20 0451  Weight: 65.8 kg    Exam: General exam: In no acute distress. Respiratory system: Good air movement and clear to auscultation. Cardiovascular system: S1 & S2 heard, RRR. No  JVD. Gastrointestinal system: Abdomen is nondistended, soft and nontender.  Extremities: No pedal edema. Skin: No rashes, lesions or ulcers Psychiatry: Judgement and insight appear normal. Mood & affect appropriate. Data Reviewed:    Labs: Basic Metabolic Panel: Recent Labs  Lab 11/28/20 0449 11/29/20 0526 11/30/20 0137 12/01/20 0151 12/02/20 0401  NA 136 137 137 139 137  K 3.3* 3.6 3.6 3.5 3.3*  CL 95* 103 108 110 107  CO2 12* 23 21* 23 23  GLUCOSE 164* 105* 91 110* 110*  BUN 10 11 11 7 9   CREATININE 1.24 1.03 0.95 0.93 0.82  CALCIUM 10.2 8.9 8.2* 8.2* 8.2*  MG  --  2.0  --   --   --     GFR Estimated Creatinine Clearance: 113.7 mL/min (by C-G formula based on SCr of 0.82 mg/dL). Liver Function Tests: Recent Labs  Lab 11/28/20 0449 11/29/20 0526  AST 30 34  ALT 26 27  ALKPHOS 78 68  BILITOT 0.5 0.5  PROT 8.4* 7.5  ALBUMIN 4.8 3.4*    Recent Labs  Lab 11/28/20 0449  LIPASE 20    No results for input(s): AMMONIA in the last 168 hours. Coagulation profile No results for input(s): INR, PROTIME in the last 168 hours. COVID-19 Labs  No results for input(s): DDIMER, FERRITIN, LDH, CRP in the last 72 hours.  Lab Results  Component Value Date   SARSCOV2NAA NEGATIVE 11/28/2020   [place] NEGATIVE 10/22/2019   [place] NEGATIVE 11/16/2018    CBC: Recent Labs  Lab 11/28/20 0449 11/29/20 0526 11/30/20 0137  WBC 23.4* 27.8* 21.9*  NEUTROABS 20.4* 24.1* 17.2*  HGB 13.5 13.0 12.6*  HCT 42.0 38.0* 36.9*  MCV 92.5 87.2 87.4  PLT 265 216 160    Cardiac Enzymes: No results for input(s): CKTOTAL, CKMB, CKMBINDEX, TROPONINI in the last 168 hours. BNP (last 3 results) No results for input(s): PROBNP in the last 8760 hours. CBG: No results for input(s): GLUCAP in the last 168 hours. D-Dimer: No results for input(s): DDIMER in the last 72 hours. Hgb A1c: No results for input(s): HGBA1C in the last 72 hours. Lipid Profile: No results for input(s):  CHOL, HDL, LDLCALC, TRIG, CHOLHDL, LDLDIRECT in the last 72 hours. Thyroid function studies: No results for input(s): TSH, T4TOTAL, T3FREE, THYROIDAB in the last 72 hours.  Invalid input(s): FREET3 Anemia work up: No results for input(s): VITAMINB12, FOLATE, FERRITIN, TIBC, IRON, RETICCTPCT in the last 72 hours. Sepsis Labs: Recent Labs  Lab 11/28/20 0449 11/29/20 0526 11/30/20 0137  WBC 23.4* 27.8* 21.9*    Microbiology Recent Results (from the past 240 hour(s))  Resp Panel by RT-PCR (Flu A&B, Covid) Nasopharyngeal Swab     Status: None   Collection Time: 11/28/20  4:47 AM   Specimen: Nasopharyngeal Swab; Nasopharyngeal(NP) swabs in vial transport medium  Result Value Ref Range Status   SARS Coronavirus 2 by RT PCR NEGATIVE NEGATIVE Final    Comment: (NOTE) SARS-CoV-2  target nucleic acids are NOT DETECTED.  The SARS-CoV-2 RNA is generally detectable in upper respiratory specimens during the acute phase of infection. The lowest concentration of SARS-CoV-2 viral copies this assay can detect is 138 copies/mL. A negative result does not preclude SARS-Cov-2 infection and should not be used as the sole basis for treatment or other patient management decisions. A negative result may occur with  improper specimen collection/handling, submission of specimen other than nasopharyngeal swab, presence of viral mutation(s) within the areas targeted by this assay, and inadequate number of viral copies(<138 copies/mL). A negative result must be combined with clinical observations, patient history, and epidemiological information. The expected result is Negative.  Fact Sheet for Patients:  EntrepreneurPulse.com.au  Fact Sheet for Healthcare Providers:  IncredibleEmployment.be  This test is no t yet approved or cleared by the [place] FDA and  has been authorized for detection and/or diagnosis of SARS-CoV-2 by FDA under an Emergency Use  Authorization (EUA). This EUA will remain  in effect (meaning this test can be used) for the duration of the COVID-19 declaration under Section 564(b)(1) of the Act, 21 U.S.C.section 360bbb-3(b)(1), unless the authorization is terminated  or revoked sooner.       Influenza A by PCR NEGATIVE NEGATIVE Final   Influenza B by PCR NEGATIVE NEGATIVE Final    Comment: (NOTE) The Xpert Xpress SARS-CoV-2/FLU/RSV plus assay is intended as an aid in the diagnosis of influenza from Nasopharyngeal swab specimens and should not be used as a sole basis for treatment. Nasal washings and aspirates are unacceptable for Xpert Xpress SARS-CoV-2/FLU/RSV testing.  Fact Sheet for Patients: EntrepreneurPulse.com.au  Fact Sheet for Healthcare Providers: IncredibleEmployment.be  This test is not yet approved or cleared by the [place] FDA and has been authorized for detection and/or diagnosis of SARS-CoV-2 by FDA under an Emergency Use Authorization (EUA). This EUA will remain in effect (meaning this test can be used) for the duration of the COVID-19 declaration under Section 564(b)(1) of the Act, 21 U.S.C. section 360bbb-3(b)(1), unless the authorization is terminated or revoked.  Performed at [facility], [address], [place], [place] [address]   CSF culture     Status: None   Collection Time: 11/28/20  6:21 AM   Specimen: CSF; Cerebrospinal Fluid  Result Value Ref Range Status   Specimen Description CSF  Final   Special Requests BACK  Final   Gram Stain   Final    WBC PRESENT, PREDOMINANTLY PMN NO ORGANISMS SEEN CYTOSPIN SMEAR    Culture   Final    NO GROWTH 3 DAYS Performed at [facility], [place] [address] [place], [place] [address]    Report Status 12/01/2020 FINAL  Final  Culture, blood (routine x 2)     Status: None   Collection Time: 11/28/20  6:45 AM   Specimen: BLOOD  Result Value Ref Range Status   Specimen  Description   Final    BLOOD BOTTLES DRAWN AEROBIC AND ANAEROBIC Performed at [facility], [address], [place], [place] [address]    Special Requests   Final    Blood Culture adequate volume BLOOD LEFT WRIST Performed at [facility], [address], [place], [place] [address]    Culture   Final    NO GROWTH 5 DAYS Performed at [facility], [place] [address], [place], [place] [address]    Report Status 12/03/2020 FINAL  Final  Culture, blood (routine x 2)  Status: None   Collection Time: 11/28/20  6:48 AM   Specimen: BLOOD  Result Value Ref Range Status   Specimen Description   Final    BLOOD BOTTLES DRAWN AEROBIC AND ANAEROBIC Performed at [facility], [address], [place], [place] [address]    Special Requests   Final    Blood Culture adequate volume BLOOD LEFT WRIST Performed at [facility], [address] [place], [place] [address]    Culture   Final    NO GROWTH 5 DAYS Performed at [facility], [place] [address], [place], [place] [address]    Report Status 12/03/2020 FINAL  Final  Culture, blood (single) w Reflex to ID Panel     Status: None   Collection Time: 11/29/20  5:26 AM   Specimen: BLOOD LEFT ARM  Result Value Ref Range Status   Specimen Description BLOOD LEFT ARM  Final   Special Requests AEROBIC BOTTLE ONLY Blood Culture adequate volume  Final   Culture   Final    NO GROWTH 5 DAYS Performed at [facility], [place] [address], [place], [place] [address]    Report Status 12/04/2020 FINAL  Final     Medications:    cloBAZam  20 mg Oral BID   enoxaparin (LOVENOX) injection  40 mg Subcutaneous Q24H   lidocaine (PF)  10 mL Infiltration Once   sertraline  50 mg Oral Daily   Continuous Infusions:  sodium chloride 50 mL/hr at 12/02/20 1113   cefTRIAXone (ROCEPHIN)  IV 2 g (12/03/20 2330)   lacosamide (VIMPAT) IV 300 mg (12/03/20 2200)   levETIRAcetam  2,500 mg (12/03/20 2259)   vancomycin 1,000 mg (12/04/20 0341)      LOS: 6 days   Adam [name]  [facility]  12/04/2020, 9:43 AM

## 2020-12-04 NOTE — Plan of Care (Signed)
  Problem: Safety: Goal: Non-violent Restraint(s) Outcome: Progressing   Problem: Education: Goal: Knowledge of General Education information will improve Description: Including pain rating scale, medication(s)/side effects and non-pharmacologic comfort measures Outcome: Progressing   Problem: Health Behavior/Discharge Planning: Goal: Ability to manage health-related needs will improve Outcome: Progressing   Problem: Clinical Measurements: Goal: Ability to maintain clinical measurements within normal limits will improve Outcome: Progressing Goal: Will remain free from infection Outcome: Progressing Goal: Diagnostic test results will improve Outcome: Progressing   Problem: Activity: Goal: Risk for activity intolerance will decrease Outcome: Progressing   Problem: Pain Managment: Goal: General experience of comfort will improve Outcome: Progressing   Problem: Safety: Goal: Ability to remain free from injury will improve Outcome: Progressing   Problem: Skin Integrity: Goal: Risk for impaired skin integrity will decrease Outcome: Progressing   

## 2020-12-05 ENCOUNTER — Inpatient Hospital Stay (HOSPITAL_COMMUNITY): Payer: Medicare Other

## 2020-12-05 ENCOUNTER — Inpatient Hospital Stay: Payer: Self-pay

## 2020-12-05 DIAGNOSIS — G40919 Epilepsy, unspecified, intractable, without status epilepticus: Secondary | ICD-10-CM | POA: Diagnosis not present

## 2020-12-05 DIAGNOSIS — I6389 Other cerebral infarction: Secondary | ICD-10-CM | POA: Diagnosis not present

## 2020-12-05 DIAGNOSIS — G009 Bacterial meningitis, unspecified: Secondary | ICD-10-CM | POA: Diagnosis not present

## 2020-12-05 LAB — HEMOGLOBIN A1C
Hgb A1c MFr Bld: 5.6 % (ref 4.8–5.6)
Mean Plasma Glucose: 114.02 mg/dL

## 2020-12-05 LAB — ECHOCARDIOGRAM COMPLETE BUBBLE STUDY
AV Mean grad: 5 mmHg
AV Peak grad: 10.5 mmHg
Ao pk vel: 1.62 m/s
Area-P 1/2: 3.4 cm2
S' Lateral: 3.2 cm

## 2020-12-05 LAB — LIPID PANEL
Cholesterol: 166 mg/dL (ref 0–200)
HDL: 40 mg/dL — ABNORMAL LOW (ref >40)
LDL Cholesterol: 114 mg/dL — ABNORMAL HIGH (ref 0–99)
Total CHOL/HDL Ratio: 4.2 RATIO
Triglycerides: 59 mg/dL (ref <150)
VLDL: 12 mg/dL (ref 0–40)

## 2020-12-05 MED ORDER — SODIUM CHLORIDE 0.9% FLUSH
10.0000 mL | Freq: Two times a day (BID) | INTRAVENOUS | Status: DC
  Administered 2020-12-05 – 2020-12-06 (×2): 10 mL

## 2020-12-05 MED ORDER — LACOSAMIDE 50 MG PO TABS
300.0000 mg | ORAL_TABLET | Freq: Two times a day (BID) | ORAL | Status: DC
  Administered 2020-12-05 – 2020-12-06 (×2): 300 mg via ORAL
  Filled 2020-12-05 (×2): qty 1

## 2020-12-05 MED ORDER — LEVETIRACETAM 750 MG PO TABS
2500.0000 mg | ORAL_TABLET | Freq: Two times a day (BID) | ORAL | Status: DC
  Administered 2020-12-05 – 2020-12-06 (×2): 2500 mg via ORAL
  Filled 2020-12-05 (×2): qty 3

## 2020-12-05 MED ORDER — ASPIRIN 300 MG RE SUPP: 300.0000 mg | Freq: Every day | RECTAL | Status: DC

## 2020-12-05 MED ORDER — CHLORHEXIDINE GLUCONATE CLOTH 2 % EX PADS
6.0000 | MEDICATED_PAD | Freq: Every day | CUTANEOUS | Status: DC
  Administered 2020-12-05 – 2020-12-06 (×2): 6 via TOPICAL

## 2020-12-05 MED ORDER — SODIUM CHLORIDE 0.9% FLUSH
10.0000 mL | INTRAVENOUS | Status: DC | PRN
  Administered 2020-12-05: 10 mL

## 2020-12-05 MED ORDER — ASPIRIN 81 MG PO CHEW
81.0000 mg | CHEWABLE_TABLET | Freq: Every day | ORAL | Status: DC
  Administered 2020-12-05 – 2020-12-06 (×2): 81 mg via ORAL
  Filled 2020-12-05 (×2): qty 1

## 2020-12-05 NOTE — Progress Notes (Signed)
Physical Therapy Treatment and DISCHARGE Patient Details Name: Adam [name] MRN: [id number] DOB: Jan 31, 1981 Today's Date: 12/05/2020   History of Present Illness 40 year old male with past medical history of intractable epilepsy S/P Vagus Nerve Stimulation 04/2019, history of nonconvulsive status epilepticus , THC use, anxiety disorder, depression who presents to [facility] with a 1 day history of progressively worsening lethargy and headaches with witnessed seizure activity by the patient's spouse in route. Ruling out meningitis    PT Comments    Pt feeling much better and "back to normal". Pt functioning independently and back at baseline. Pt scored 24/24 on DGI inidicating minimal falls risk. Pt cleared to amb in hallway indep as long as he has a mask on. Pt with no further acute PT needs at this time. PT signing off, please re-consult if needed in future.    Recommendations for follow up therapy are one component of a multi-disciplinary discharge planning process, led by the attending physician.  Recommendations may be updated based on patient status, additional functional criteria and insurance authorization.  Follow Up Recommendations  No PT follow up     Assistance Recommended at Discharge None  Equipment Recommendations  None recommended by PT    Recommendations for Other Services       Precautions / Restrictions Precautions Precaution Comments: droplet precautions Restrictions Weight Bearing Restrictions: No     Mobility  Bed Mobility Overal bed mobility: Independent                  Transfers Overall transfer level: Independent Equipment used: None Transfers: Sit to/from Stand Sit to Stand: Independent           General transfer comment: pt able to manage IV pole without assist    Ambulation/Gait Ambulation/Gait assistance: Independent Gait Distance (Feet): 300 Feet Assistive device: None Gait Pattern/deviations: WFL(Within  Functional Limits) Gait velocity: wfl Gait velocity interpretation: >4.37 ft/sec, indicative of normal walking speed General Gait Details: no episodes of LOB, WFL   Stairs Stairs: Yes Stairs assistance: Independent Stair Management: No rails;Alternating pattern Number of Stairs: 5 (limited by IV pole) General stair comments: no difficulty   Wheelchair Mobility    Modified Rankin (Stroke Patients Only)       Balance Overall balance assessment: Independent                               Standardized Balance Assessment Standardized Balance Assessment : Dynamic Gait Index   Dynamic Gait Index Level Surface: Normal Change in Gait Speed: Normal Gait with Horizontal Head Turns: Normal Gait with Vertical Head Turns: Normal Gait and Pivot Turn: Normal Step Over Obstacle: Normal Step Around Obstacles: Normal Steps: Normal Total Score: 24      Cognition Arousal/Alertness: Awake/alert Behavior During Therapy: WFL for tasks assessed/performed Overall Cognitive Status: Within Functional Limits for tasks assessed                                          Exercises      General Comments General comments (skin integrity, edema, etc.): vss      Pertinent Vitals/Pain Pain Assessment: No/denies pain    Home Living                          Prior Function  PT Goals (current goals can now be found in the care plan section) Acute Rehab PT Goals PT Goal Formulation: All assessment and education complete, DC therapy (pt has met all goals and is functioning independently) Progress towards PT goals: Goals met/education completed, patient discharged from PT    Frequency    Other (Comment) (pt being d/c'd from PT services)      PT Plan Other (comment) (pt d/c from PT services)    Co-evaluation              AM-PAC PT "6 Clicks" Mobility   Outcome Measure  Help needed turning from your back to your side while in  a flat bed without using bedrails?: None Help needed moving from lying on your back to sitting on the side of a flat bed without using bedrails?: None Help needed moving to and from a bed to a chair (including a wheelchair)?: None Help needed standing up from a chair using your arms (e.g., wheelchair or bedside chair)?: None Help needed to walk in hospital room?: None Help needed climbing 3-5 steps with a railing? : None 6 Click Score: 24    End of Session   Activity Tolerance: Patient tolerated treatment well Patient left: in bed;with call bell/phone within reach (sitting EOB to eat breakfast) Nurse Communication: Mobility status PT Visit Diagnosis: Unsteadiness on feet (R26.81);Muscle weakness (generalized) (M62.81)     Time: 0630-1601 PT Time Calculation (min) (ACUTE ONLY): 14 min  Charges:  $Gait Training: 8-22 mins                     Kittie Plater, PT, DPT Acute Rehabilitation Services Pager #: [phone] Office #: [phone]    Berline Lopes 12/05/2020, 9:18 AM

## 2020-12-05 NOTE — Care Plan (Signed)
VNS was interrogated and turned off by me on 12/05/2020 at 1400.     Initial parameters     Normal Autostim Magnet  Output 0.874mA 87mA 1.140mA  Frequency 20Hz       Pulse Width 250usec 250usec 250usec  On time 30 sec 30 sec 30 sec  Off time 5 min      Duty cycle 10%          After turning off VNS     Normal Autostim Magnet  Output 78mA 78mA 34mA  Frequency 20Hz       Pulse Width 250usec 250usec 250usec  On time 30 sec 30 sec 30 sec  Off time 5 min      Duty cycle 10%          Rodd Heft 1m

## 2020-12-05 NOTE — Progress Notes (Addendum)
Subjective: Patient states he is feeling fine.  States he would like to go home to continue with antibiotics or would rather leave AMA.  I explained his diagnosis and the importance of IV antibiotics. When asked if there is anything I can do to make his hospital stay more comfortable.  He states states there is nothing we can say or do to keep him in the hospital.  ROS: negative except above  Examination  Vital signs in last 24 hours: Temp:  [98 F (36.7 C)-98.9 F (37.2 C)] 98.5 F (36.9 C) (10/26 0844) Pulse Rate:  [55-66] 64 (10/26 0844) Resp:  [14-16] 16 (10/26 0844) BP: (102-117)/(54-80) 112/69 (10/26 0844) SpO2:  [99 %-100 %] 100 % (10/26 0844)  General: lying in bed, NAD CVS: pulse-normal rate and rhythm RS: breathing comfortably, CTAB Extremities: normal, warm  Neuro: MS: Alert, oriented, follows commands CN: pupils equal and reactive,  EOMI, face symmetric, tongue midline, normal sensation over face, Motor: 5/5 strength in all 4 extremities Reflexes: 2+ bilaterally over patella, biceps, plantars: flexor Coordination: normal Gait: not tested  Basic Metabolic Panel: Recent Labs  Lab 11/29/20 0526 11/30/20 0137 12/01/20 0151 12/02/20 0401 12/04/20 1038  NA 137 137 139 137 137  K 3.6 3.6 3.5 3.3* 4.1  CL 103 108 110 107 103  CO2 23 21* 23 23 25   GLUCOSE 105* 91 110* 110* 88  BUN 11 11 7 9 6   CREATININE 1.03 0.95 0.93 0.82 0.82  CALCIUM 8.9 8.2* 8.2* 8.2* 9.0  MG 2.0  --   --   --   --     CBC: Recent Labs  Lab 11/29/20 0526 11/30/20 0137  WBC 27.8* 21.9*  NEUTROABS 24.1* 17.2*  HGB 13.0 12.6*  HCT 38.0* 36.9*  MCV 87.2 87.4  PLT 216 160     Coagulation Studies: No results for input(s): LABPROT, INR in the last 72 hours.  Imaging MRI brain with and without contrast 12/04/2020: Punctate acute or subacute left frontal infarct. No evidence of meningitis complication. Bilateral anterior frontal encephalomalacia.    ASSESSMENT AND PLAN: 40 year old  male with history of intractable epilepsy status post VNS placement who presented with breakthrough seizures and was diagnosed with bacterial meningitis.  Bacterial meningitis Epilepsy with breakthrough seizure Acute or subacute left frontal infarct Cannabis use disorder Leukocytosis  Hypoalbuminemia -Lumbar puncture on 10/90/2022 showed 4743 WBCs and tube 1 and 2855 in bottle for, predominantly neutrophilic, CSF glucose less than 20, CSF protein >600, HSV PCR negative, CSF culture with WBCs, predominantly PMN, no organisms were seen.  CSF path showed marked acute inflammation. -Acute/subacute left frontal infarct, likely incidental.  tPA was not administered because no clear last known normal. -Etiology for stroke: Could be due to small vessel inflammation due to meningitis.  Recommendations -On IV vancomycin and ceftriaxone, end date 12/12/2020 -Continue Keppra 2500 twice daily, lacosamide 200 twice daily.  We will switch to p.o. Also continue clobazam 20 mg twice daily -Stroke work-up ordered including carotid Dopplers, MRA head, TTE, A1c and lipid panel -Started patient on aspirin 81 mg for secondary stroke prevention. -Continue seizure precautions -As needed IV Ativan 2 mg for clinical seizure-like activity -Discussed his diagnosis and the importance antibiotics with patient and his wife on phone.  Also discussed that if patient leaves AMA, he is at risk of worsening infection, strokes, seizures, further brain damage due to meningitis.  Patient's wife states she understands the risks however patient may not listen or understand.  She wanted to know  if there is a way patient can receive antibiotics at home.  I told them that for meningitis I would recommend patient finishes his IV antibiotic course while in the hospital so we can also closely monitor him.  However I will discuss with Dr. Jomarie Longs and case manager that if patient chooses to leave AMA, either way for him to finish his antibiotic  course at home rather than receiving incomplete therapy. -Management of rest of comorbidities per primary team  I have spent a total of 60  minutes with the patient reviewing hospital notes,  test results, labs and examining the patient as well as establishing an assessment and plan that was discussed personally with the patient, wife on phone, RN at bedside and Dr. Jomarie Longs.  > 50% of time was spent in direct patient care.    Lindie Spruce Epilepsy [facility] For questions after 5pm please refer to AMION to reach the Neurologist on call

## 2020-12-05 NOTE — Progress Notes (Addendum)
24-hour EEG showed no signs of seizures some beta activity probably due to benzos.  [facility] PROGRESS NOTE    Progress Note  Adam [name]  XBJ:[id number] DOB: 02-08-1981 DOA: 11/28/2020 PCP: Patient, No Pcp Per (Inactive)     Brief Narrative:   Adam [name] is an 40 y.o. male past medical history of intractable epilepsy, status post vagus nerve stimulation on March 2021, history of nonconvulsive status epilepticus, cannabis use, depression who [facility] with history of 1 day of progressive worsening headache lethargy and seizure activity reported by the patient's spouse.  As per the wife the patient has been compliant with his antiepileptic drug has not undergone any recent travel exposures or changes in her medication.  Due to her progressive weakness she decided to bring him to the ED where he exhibited tonic-clonic movement activity.  Patient was initially started empirically on Rocephin and vancomycin, neurology was consulted, he was loaded with Keppra and Vimpat, he was noted to be febrile in the emergency room -CT of the head showed no acute cranial abnormalitie but it did showed extensive remote bilateral frontal craniotomy with chronic bilateral inferior frontal lobe encephalomalacia. -LP was concerning for bacterial meningitis with cloudy CSF, 4743 WBCs, 96% neutrophils, increased protein, decreased glucose -CSF cultures are negative, clinically improving on antibiotics   Assessment/Plan:   Breakthrough seizure (HCC) -Likely provoked in the setting of bacterial meningitis, long history of seizure disorder -CSF cell count, glucose and protein highly suggestive of bacterial meningitis, CSF cultures are negative -Blood cultures are negative -He was continued on Keppra Vimpat and Onfi -Neurology following  Acute bacterial meningitis -Presented with headache, lethargy, low-grade fever and seizure on admission -LP highly suggestive of bacterial  meningitis, CSF cultures remain negative -Acyclovir discontinued, remains on IV vancomycin and ceftriaxone for 14days, await Neuro input today  CVA -MRI yesterday noted Punctate acute or subacute left frontal infarct. -Discussed with neurology, add aspirin 81 mg daily, check 2D echo, carotid duplex, HbA1c and LDL  Clear nasal discharge: Was sent for an outside lab for beta-2 transferrin. -Resolved   DVT prophylaxis: lovenox CODE STATUS: Full code Family Communication: Discussed with patient, no family at bedside Status is: Inpatient   Procedures and diagnostic studies:   MR BRAIN W WO CONTRAST  Result Date: 12/04/2020 CLINICAL DATA:  Seizure, CSF consistent with meningitis EXAM: MRI HEAD WITHOUT AND WITH CONTRAST TECHNIQUE: Multiplanar, multiecho pulse sequences of the brain and surrounding structures were obtained without and with intravenous contrast. CONTRAST:  76mL GADAVIST GADOBUTROL 1 MMOL/ML IV SOLN COMPARISON:  None.  Spells is a vagal nerve stimulator leads remain FINDINGS: Brain: Punctate focus of diffusion hyperintensity in the high left frontal lobe. A few additional punctate foci of extra-axial diffusion hyperintensity posteriorly appear to correspond to vessels. There is no corresponding abnormal signal or enhancement on other sequences. There is no evidence of intracranial hemorrhage. Bilateral anterior frontal encephalomalacia. There is no intracranial mass, mass effect, or edema. There is no hydrocephalus or extra-axial fluid collection. No abnormal parenchymal or leptomeningeal enhancement. Vascular: Major vessel flow voids at the skull base are preserved. Skull and upper cervical spine: Chronic postoperative changes. Normal marrow signal is preserved. Sinuses/Orbits: Prior sinonasal surgery. Mucosal thickening and layering secretions. Orbits are unremarkable. Other: Sella is unremarkable.  Mastoid air cells are clear. IMPRESSION: Punctate acute or subacute left frontal  infarct. No evidence of meningitis complication. Bilateral anterior frontal encephalomalacia. Electronically Signed   By: Guadlupe Spanish M.D.   On: 12/04/2020 15:46  Medical Consultants:   Neurology   Subjective:   -Denies any complaints this morning, anxious to go home  Objective:    Vitals:   12/04/20 1553 12/04/20 2015 12/05/20 0352 12/05/20 0844  BP: 113/69 109/72 (!) 102/54 112/69  Pulse: (!) 58 66 (!) 55 64  Resp: 14 16 15 16   Temp: 98 F (36.7 C) 98.9 F (37.2 C) 98.6 F (37 C) 98.5 F (36.9 C)  TempSrc: Oral Oral Oral Oral  SpO2: 100% 100% 99% 100%  Weight:      Height:       SpO2: 100 %   Intake/Output Summary (Last 24 hours) at 12/05/2020 1154 Last data filed at 12/05/2020 0900 Gross per 24 hour  Intake 240 ml  Output 3900 ml  Net -3660 ml   Filed Weights   11/28/20 0451  Weight: 65.8 kg    Exam: General exam: AAOx3, no distress HEENT: no JVD, no icterus CVS: S1-S2, regular rate rhythm Lungs: Clear bilaterally Abdomen: Soft, nontender, bowel sounds present Extremities: No edema Skin: No rash on exposed skin Psych: Appropriate mood and affect   Data Reviewed:    Labs: Basic Metabolic Panel: Recent Labs  Lab 11/29/20 0526 11/30/20 0137 12/01/20 0151 12/02/20 0401 12/04/20 1038  NA 137 137 139 137 137  K 3.6 3.6 3.5 3.3* 4.1  CL 103 108 110 107 103  CO2 23 21* 23 23 25   GLUCOSE 105* 91 110* 110* 88  BUN 11 11 7 9 6   CREATININE 1.03 0.95 0.93 0.82 0.82  CALCIUM 8.9 8.2* 8.2* 8.2* 9.0  MG 2.0  --   --   --   --    GFR Estimated Creatinine Clearance: 113.7 mL/min (by C-G formula based on SCr of 0.82 mg/dL). Liver Function Tests: Recent Labs  Lab 11/29/20 0526  AST 34  ALT 27  ALKPHOS 68  BILITOT 0.5  PROT 7.5  ALBUMIN 3.4*   No results for input(s): LIPASE, AMYLASE in the last 168 hours. No results for input(s): AMMONIA in the last 168 hours. Coagulation profile No results for input(s): INR, PROTIME in the last 168  hours. COVID-19 Labs  No results for input(s): DDIMER, FERRITIN, LDH, CRP in the last 72 hours.  Lab Results  Component Value Date   SARSCOV2NAA NEGATIVE 11/28/2020   SARSCOV2NAA NEGATIVE 10/22/2019   SARSCOV2NAA NEGATIVE 11/16/2018    CBC: Recent Labs  Lab 11/29/20 0526 11/30/20 0137  WBC 27.8* 21.9*  NEUTROABS 24.1* 17.2*  HGB 13.0 12.6*  HCT 38.0* 36.9*  MCV 87.2 87.4  PLT 216 160   Cardiac Enzymes: No results for input(s): CKTOTAL, CKMB, CKMBINDEX, TROPONINI in the last 168 hours. BNP (last 3 results) No results for input(s): PROBNP in the last 8760 hours. CBG: No results for input(s): GLUCAP in the last 168 hours. D-Dimer: No results for input(s): DDIMER in the last 72 hours. Hgb A1c: No results for input(s): HGBA1C in the last 72 hours. Lipid Profile: No results for input(s): CHOL, HDL, LDLCALC, TRIG, CHOLHDL, LDLDIRECT in the last 72 hours. Thyroid function studies: No results for input(s): TSH, T4TOTAL, T3FREE, THYROIDAB in the last 72 hours.  Invalid input(s): FREET3 Anemia work up: No results for input(s): VITAMINB12, FOLATE, FERRITIN, TIBC, IRON, RETICCTPCT in the last 72 hours. Sepsis Labs: Recent Labs  Lab 11/29/20 0526 11/30/20 0137  WBC 27.8* 21.9*   Microbiology Recent Results (from the past 240 hour(s))  Resp Panel by RT-PCR (Flu A&B, Covid) Nasopharyngeal Swab     Status: None  Collection Time: 11/28/20  4:47 AM   Specimen: Nasopharyngeal Swab; Nasopharyngeal(NP) swabs in vial transport medium  Result Value Ref Range Status   SARS Coronavirus 2 by RT PCR NEGATIVE NEGATIVE Final    Comment: (NOTE) SARS-CoV-2 target nucleic acids are NOT DETECTED.  The SARS-CoV-2 RNA is generally detectable in upper respiratory specimens during the acute phase of infection. The lowest concentration of SARS-CoV-2 viral copies this assay can detect is 138 copies/mL. A negative result does not preclude SARS-Cov-2 infection and should not be used as the sole  basis for treatment or other patient management decisions. A negative result may occur with  improper specimen collection/handling, submission of specimen other than nasopharyngeal swab, presence of viral mutation(s) within the areas targeted by this assay, and inadequate number of viral copies(<138 copies/mL). A negative result must be combined with clinical observations, patient history, and epidemiological information. The expected result is Negative.  Fact Sheet for Patients:  BloggerCourse.com  Fact Sheet for Healthcare Providers:  SeriousBroker.it  This test is no t yet approved or cleared by the [place] FDA and  has been authorized for detection and/or diagnosis of SARS-CoV-2 by FDA under an Emergency Use Authorization (EUA). This EUA will remain  in effect (meaning this test can be used) for the duration of the COVID-19 declaration under Section 564(b)(1) of the Act, 21 U.S.C.section 360bbb-3(b)(1), unless the authorization is terminated  or revoked sooner.       Influenza A by PCR NEGATIVE NEGATIVE Final   Influenza B by PCR NEGATIVE NEGATIVE Final    Comment: (NOTE) The Xpert Xpress SARS-CoV-2/FLU/RSV plus assay is intended as an aid in the diagnosis of influenza from Nasopharyngeal swab specimens and should not be used as a sole basis for treatment. Nasal washings and aspirates are unacceptable for Xpert Xpress SARS-CoV-2/FLU/RSV testing.  Fact Sheet for Patients: BloggerCourse.com  Fact Sheet for Healthcare Providers: SeriousBroker.it  This test is not yet approved or cleared by the [place] FDA and has been authorized for detection and/or diagnosis of SARS-CoV-2 by FDA under an Emergency Use Authorization (EUA). This EUA will remain in effect (meaning this test can be used) for the duration of the COVID-19 declaration under Section 564(b)(1) of the Act,  21 U.S.C. section 360bbb-3(b)(1), unless the authorization is terminated or revoked.  Performed at [facility], [address] [place], [place] [address]   CSF culture     Status: None   Collection Time: 11/28/20  6:21 AM   Specimen: CSF; Cerebrospinal Fluid  Result Value Ref Range Status   Specimen Description CSF  Final   Special Requests BACK  Final   Gram Stain   Final    WBC PRESENT, PREDOMINANTLY PMN NO ORGANISMS SEEN CYTOSPIN SMEAR    Culture   Final    NO GROWTH 3 DAYS Performed at [facility], [address] [place], [place] [address]    Report Status 12/01/2020 FINAL  Final  Culture, blood (routine x 2)     Status: None   Collection Time: 11/28/20  6:45 AM   Specimen: BLOOD  Result Value Ref Range Status   Specimen Description   Final    BLOOD BOTTLES DRAWN AEROBIC AND ANAEROBIC Performed at [facility], [address], [place], [place] [address]    Special Requests   Final    Blood Culture adequate volume BLOOD LEFT WRIST Performed at [facility], [address], [place], [place] [address]    Culture  Final    NO GROWTH 5 DAYS Performed at [facility], [address] [place], [place] [address]    Report Status 12/03/2020 FINAL  Final  Culture, blood (routine x 2)     Status: None   Collection Time: 11/28/20  6:48 AM   Specimen: BLOOD  Result Value Ref Range Status   Specimen Description   Final    BLOOD BOTTLES DRAWN AEROBIC AND ANAEROBIC Performed at [facility], [address], [place], [place] [address]    Special Requests   Final    Blood Culture adequate volume BLOOD LEFT WRIST Performed at [facility], [address] [place], [place] [address]    Culture   Final    NO GROWTH 5 DAYS Performed at [facility], [address], [place], [place] [address]    Report Status 12/03/2020 FINAL  Final  Culture, blood (single)  w Reflex to ID Panel     Status: None   Collection Time: 11/29/20  5:26 AM   Specimen: BLOOD LEFT ARM  Result Value Ref Range Status   Specimen Description BLOOD LEFT ARM  Final   Special Requests AEROBIC BOTTLE ONLY Blood Culture adequate volume  Final   Culture   Final    NO GROWTH 5 DAYS Performed at [facility], [address] [place], [place] [address]    Report Status 12/04/2020 FINAL  Final     Medications:    aspirin  81 mg Oral Daily   Or   aspirin  300 mg Rectal Daily   cloBAZam  20 mg Oral BID   enoxaparin (LOVENOX) injection  40 mg Subcutaneous Q24H   lidocaine (PF)  10 mL Infiltration Once   sertraline  50 mg Oral Daily   Continuous Infusions:  cefTRIAXone (ROCEPHIN)  IV Stopped (12/05/20 0230)   lacosamide (VIMPAT) IV 300 mg (12/05/20 1122)   levETIRAcetam 2,500 mg (12/05/20 0942)   vancomycin 1,000 mg (12/05/20 0945)      LOS: 7 days   Zannie Cove  [facility]  12/05/2020, 11:54 AM

## 2020-12-05 NOTE — Progress Notes (Signed)
Peripherally Inserted Central Catheter Placement  The IV Nurse has discussed with the patient and/or persons authorized to consent for the patient, the purpose of this procedure and the potential benefits and risks involved with this procedure.  The benefits include less needle sticks, lab draws from the catheter, and the patient may be discharged home with the catheter. Risks include, but not limited to, infection, bleeding, blood clot (thrombus formation), and puncture of an artery; nerve damage and irregular heartbeat and possibility to perform a PICC exchange if needed/ordered by physician.  Alternatives to this procedure were also discussed.  Bard Power PICC patient education guide, fact sheet on infection prevention and patient information card has been provided to patient /or left at bedside.    PICC Placement Documentation  PICC Single Lumen 12/05/20 Right Brachial 37 cm 0 cm (Active)  Indication for Insertion or Continuance of Line Home intravenous therapies (PICC only) 12/05/20 2056  Exposed Catheter (cm) 0 cm 12/05/20 2056  Site Assessment Clean;Dry;Intact 12/05/20 2056  Line Status Flushed;Blood return noted;Saline locked 12/05/20 2056  Dressing Type Transparent 12/05/20 2056  Dressing Status Clean;Dry;Intact 12/05/20 2056  Antimicrobial disc in place? Yes 12/05/20 2056  Safety Lock Not Applicable 12/05/20 2056  Line Care Connections checked and tightened 12/05/20 2056  Dressing Intervention New dressing 12/05/20 2056  Dressing Change Due 12/12/20 12/05/20 2056       Valisa Karpel, Norton Pastel 12/05/2020, 8:57 PM

## 2020-12-05 NOTE — Progress Notes (Addendum)
PHARMACY CONSULT NOTE FOR:  OUTPATIENT  PARENTERAL ANTIBIOTIC THERAPY (OPAT)  Indication: Meningitis Regimen:  Vancomycin 1500 mg IV Q 12 hours Ceftriaxone 2 gm IV Q 12 hours  End date: 12/11/2020  IV antibiotic discharge orders are pended. To discharging provider:  please sign these orders via discharge navigator,  Select New Orders & click on the button choice - Manage This Unsigned Work.     Thank you for allowing pharmacy to be a part of this patient's care.  Vinnie Level, PharmD., BCPS, BCCCP Clinical Pharmacist Please refer to [facility] for unit-specific pharmacist

## 2020-12-05 NOTE — TOC Transition Note (Signed)
Transition of Care [facility]) - CM/SW Discharge Note   Patient Details  Name: Adam [name] MRN: [id number] Date of Birth: 1980-12-06  Transition of Care [facility]) CM/SW Contact:  Kermit Balo, RN Phone Number: 12/05/2020, 3:30 PM   Clinical Narrative:    Patient has decided he wants to d/c home to finish his IV abx. CM has spoken to his SO and she has no preference on the home health agency. [facility]. Pam with [facility] will provide the IV abx and education to patient and SO. Pt will need PICC prior to d/c home tomorrow.  TOC following.     Barriers to Discharge: Continued Medical Work up   Patient Goals and CMS Choice   CMS Medicare.gov Compare Post Acute Care list provided to:: Patient Represenative (must comment) Choice offered to / list presented to : Spouse  Discharge Placement                       Discharge Plan and Services   Discharge Planning Services: CM Consult Post Acute Care Choice: Home Health                    HH Arranged: RN [facility] Agency: [facility] Date [facility] Agency Contacted: 12/05/20   Representative spoke with at [facility] Agency: Kandee Keen  Social Determinants of Health (SDOH) Interventions     Readmission Risk Interventions No flowsheet data found.

## 2020-12-06 ENCOUNTER — Inpatient Hospital Stay (HOSPITAL_COMMUNITY): Payer: Medicare Other

## 2020-12-06 DIAGNOSIS — G40919 Epilepsy, unspecified, intractable, without status epilepticus: Secondary | ICD-10-CM

## 2020-12-06 DIAGNOSIS — I639 Cerebral infarction, unspecified: Secondary | ICD-10-CM

## 2020-12-06 DIAGNOSIS — G009 Bacterial meningitis, unspecified: Secondary | ICD-10-CM | POA: Diagnosis not present

## 2020-12-06 LAB — BASIC METABOLIC PANEL
Anion gap: 10 (ref 5–15)
BUN: 9 mg/dL (ref 6–20)
CO2: 27 mmol/L (ref 22–32)
Calcium: 9.7 mg/dL (ref 8.9–10.3)
Chloride: 98 mmol/L (ref 98–111)
Creatinine, Ser: 0.97 mg/dL (ref 0.61–1.24)
GFR, Estimated: >60 mL/min (ref >60)
Glucose, Bld: 73 mg/dL (ref 70–99)
Potassium: 4 mmol/L (ref 3.5–5.1)
Sodium: 135 mmol/L (ref 135–145)

## 2020-12-06 MED ORDER — ATORVASTATIN CALCIUM 40 MG PO TABS: 40.0000 mg | ORAL_TABLET | Freq: Every day | ORAL | 1 refills | Status: DC

## 2020-12-06 MED ORDER — ATORVASTATIN CALCIUM 40 MG PO TABS
40.0000 mg | ORAL_TABLET | Freq: Every day | ORAL | Status: DC
  Administered 2020-12-06: 40 mg via ORAL
  Filled 2020-12-06: qty 1

## 2020-12-06 MED ORDER — CEFTRIAXONE IV (FOR PTA / DISCHARGE USE ONLY): 2.0000 g | Freq: Two times a day (BID) | INTRAVENOUS | 0 refills | Status: DC

## 2020-12-06 MED ORDER — ASPIRIN 81 MG PO CHEW
81.0000 mg | CHEWABLE_TABLET | Freq: Every day | ORAL | Status: DC
Start: 2020-12-06 — End: 2021-03-24

## 2020-12-06 MED ORDER — VANCOMYCIN IV (FOR PTA / DISCHARGE USE ONLY): 1500.0000 mg | Freq: Two times a day (BID) | INTRAVENOUS | 0 refills | Status: DC

## 2020-12-06 NOTE — TOC Transition Note (Signed)
Transition of Care [facility]) - CM/SW Discharge Note   Patient Details  Name: Mason Dibiasio MRN: [id number] Date of Birth: 07/02/1980  Transition of Care [facility]) CM/SW Contact:  Kermit Balo, RN Phone Number: 12/06/2020, 12:50 PM   Clinical Narrative:    Patient is discharging home with home health services through [place]. Cory with [facility] is aware of d/c home today. Pam with [facility] to do education with his SO and provide the antibiotics for home.  Pt has supervision at home and transportation to home.    Final next level of care: Home w Home Health Services Barriers to Discharge: No Barriers Identified   Patient Goals and CMS Choice   CMS Medicare.gov Compare Post Acute Care list provided to:: Patient Represenative (must comment) Choice offered to / list presented to : Spouse  Discharge Placement                       Discharge Plan and Services   Discharge Planning Services: CM Consult Post Acute Care Choice: Home Health                    HH Arranged: RN [facility] Agency: [facility] Date [facility] Agency Contacted: 12/05/20   Representative spoke with at [facility] Agency: Kandee Keen  Social Determinants of Health (SDOH) Interventions     Readmission Risk Interventions No flowsheet data found.

## 2020-12-06 NOTE — Progress Notes (Signed)
VASCULAR LAB    Carotid dulpex has been performed.  See CV proc for preliminary results.   Vy Badley, RVT 12/06/2020, 11:08 AM

## 2020-12-06 NOTE — Plan of Care (Signed)
  Problem: Safety: Goal: Non-violent Restraint(s) Outcome: Adequate for Discharge   Problem: Education: Goal: Knowledge of General Education information will improve Description: Including pain rating scale, medication(s)/side effects and non-pharmacologic comfort measures Outcome: Adequate for Discharge   Problem: Clinical Measurements: Goal: Ability to maintain clinical measurements within normal limits will improve Outcome: Adequate for Discharge Goal: Will remain free from infection Outcome: Adequate for Discharge Goal: Diagnostic test results will improve Outcome: Adequate for Discharge   Problem: Pain Managment: Goal: General experience of comfort will improve Outcome: Adequate for Discharge   Problem: Safety: Goal: Ability to remain free from injury will improve Outcome: Adequate for Discharge   Problem: Skin Integrity: Goal: Risk for impaired skin integrity will decrease Outcome: Adequate for Discharge

## 2020-12-06 NOTE — Progress Notes (Signed)
Adam [name] D/C'd Home per MD order.  Discussed with the patient and all questions fully answered.  VSS, Skin clean, dry and intact without evidence of skin break down, no evidence of skin tears noted. IV catheter discontinued intact. Site without signs and symptoms of complications. Dressing and pressure applied.  An After Visit Summary was printed and given to the patient. Patient received prescription.  D/c education completed with patient/family including follow up instructions, medication list, d/c activities limitations if indicated, with other d/c instructions as indicated by MD - patient able to verbalize understanding, all questions fully answered.   Patient instructed to return to ED, call 911, or call MD for any changes in condition.   Patient was ambulatory on D/C and was escorted to the elevator at 1630 and D/C home via private auto.  Melvenia Needles 12/06/2020 4:33 PM

## 2020-12-06 NOTE — Discharge Summary (Signed)
Physician Discharge Summary  Adam [name] NAT:[id number] DOB: 1980/08/17 DOA: 11/28/2020  PCP: Patient, No Pcp Per (Inactive)  Admit date: 11/28/2020 Discharge date: 12/06/2020  Time spent: 35 minutes  Recommendations for Outpatient Follow-up:  PCP in 1 week Continue IV vancomycin and ceftriaxone till 11/2, please remove PICC line after antibiotic course is completed Home health RN [place] neurology in 4 weeks   Discharge Diagnoses:  Principal Problem:   Breakthrough seizure [facility]) Active Problems:   Acute bacterial meningitis History of intractable epilepsy Status post vagus nerve stimulation in 3/21 Depression History of cannabis use  Discharge Condition: Stable  Diet recommendation: Heart healthy  Filed Weights   11/28/20 0451  Weight: 65.8 kg    History of present illness:  Adam [name] is an 40 y.o. male past medical history of intractable epilepsy, status post vagus nerve stimulation on March 2021, history of nonconvulsive status epilepticus, cannabis use, depression who presented to [facility] with history of 1 day of progressive worsening headache lethargy and seizure activity reported by the patient's spouse.  As per the wife the patient has been compliant with his antiepileptic drug has not undergone any recent travel exposures or changes in her medication.  Due to her progressive weakness she decided to bring him to the ED where he exhibited tonic-clonic movement activity  Hospital Course:   Breakthrough seizure [facility]) -Likely provoked in the setting of bacterial meningitis, long history of seizure disorder -CSF cell count, glucose and protein highly suggestive of bacterial meningitis, CSF cultures are negative -Blood cultures are negative -He was continued on Keppra Vimpat and Onfi -No further seizures, stable from the standpoint   Acute bacterial meningitis -Presented with headache, lethargy, low-grade fever and seizure on admission -LP highly  suggestive of bacterial meningitis, with cloudy CSF, 4743 WBCs, 96% neutrophils, increased protein, decreased glucose, CSF cultures remain negative -Acyclovir discontinued, remains on IV vancomycin and ceftriaxone for 14days, -Patient was adamant to be discharged and threatening to leave [place] hence after much discussion with family decision was made to discharge him home on IV antibiotics for 1 more week to complete 2-week course of therapy, PICC line was placed yesterday.  He will continue IV vancomycin and ceftriaxone till 11/2, PICC line to be removed after antibiotic course completed   CVA -MRI yesterday noted Punctate acute or subacute left frontal infarct.,  Possibly incidental finding or related to meningitis -Aspirin 81 mg daily added, 2D echo noted preserved EF, no cardiac source of embolus noted, carotid duplex pending, MRI brain noted patent intracranial vasculature -LDL was 114, started on low-dose statin at discharge -Follow-up with neurology in 4 to 6 weeks   Clear nasal discharge: Was sent for an outside lab for beta-2 transferrin. -Resolved      Discharge Exam: Vitals:   12/06/20 0327 12/06/20 0906  BP: 112/73 120/77  Pulse: 63 61  Resp: 14 14  Temp: 98.7 F (37.1 C) 98.2 F (36.8 C)  SpO2: 100% 100%    Gen: Awake, Alert, Oriented X 3,  HEENT: no JVD Lungs: Good air movement bilaterally, CTAB CVS: S1S2/RRR Abd: soft, Non tender, non distended, BS present Extremities: No edema Skin: no new rashes on exposed skin   Discharge Instructions   Discharge Instructions     [facility] pharmacist to adjust dose for Vancomycin, Aminoglycosides and other anti-infective therapies as requested by physician.   Complete by: As directed    [facility] to provide Cath Flo 73m   Complete by: As  directed    Administer for PICC line occlusion and as ordered by physician for other access device issues.   Anaphylaxis Kit: Provided to  treat any anaphylactic reaction to the medication being provided to the patient if First Dose or when requested by physician   Complete by: As directed    Epinephrine 60m/ml vial / amp: Administer 0.341m(0.26m47msubcutaneously once for moderate to severe anaphylaxis, nurse to call physician and pharmacy when reaction occurs and call 911 if needed for immediate care   Diphenhydramine 67m51m IV vial: Administer 25-67mg426mIM PRN for first dose reaction, rash, itching, mild reaction, nurse to call physician and pharmacy when reaction occurs   Sodium Chloride 0.9% NS 500ml 78mAdminister if needed for hypovolemic blood pressure drop or as ordered by physician after call to physician with anaphylactic reaction   Change dressing on IV access line weekly and PRN   Complete by: As directed    Flush IV access with Sodium Chloride 0.9% and Heparin 10 units/ml or 100 units/ml   Complete by: As directed    Home infusion instructions - [facility]   Complete by: As directed    Instructions: Flush IV access with Sodium Chloride 0.9% and Heparin 10units/ml or 100units/ml   Change dressing on IV access line: Weekly and PRN   Instructions Cath Flo 2mg: A70mnister for PICC Line occlusion and as ordered by physician for other access device   [facility] pharmacist to adjust dose for: Vancomycin, Aminoglycosides and other anti-infective therapies as requested by physician   Method of administration may be changed at the discretion of home infusion pharmacist based upon assessment of the patient and/or caregiver's ability to self-administer the medication ordered   Complete by: As directed       Allergies as of 12/06/2020   No Known Allergies      Medication List     TAKE these medications    aspirin 81 MG chewable tablet Chew 1 tablet (81 mg total) by mouth daily.   cefTRIAXone  IVPB Commonly known as: ROCEPHIN Inject 2 g into the vein every 12 (twelve) hours. Indication:   Meningitis First Dose: No Last Day of Therapy:  12/12/2020 Labs - Once weekly:  CBC/D and BMP, Labs - Every other week:  ESR and CRP Method of administration: IV Push Method of administration may be changed at the discretion of home infusion pharmacist based upon assessment of the patient and/or caregiver's ability to self-administer the medication ordered.   cloBAZam 20 MG tablet Commonly known as: ONFI Take 20 mg by mouth 2 (two) times daily.   clonazePAM 0.5 MG disintegrating tablet Commonly known as: KLONOPIN Take 0.5 mg by mouth daily as needed for seizure.   levETIRAcetam 1000 MG tablet Commonly known as: KEPPRA Take 2,500 mg by mouth 2 (two) times daily.   LORazepam 2 MG/ML injection Commonly known as: ATIVAN Inject 1 mL (2 mg total) into the muscle once as needed for up to 1 dose for seizure.   pyridoxine 100 MG tablet Commonly known as: B-6 Take 100 mg by mouth daily.   sertraline 50 MG tablet Commonly known as: ZOLOFT Take 50 mg by mouth daily.   vancomycin  IVPB Inject 1,500 mg into the vein every 12 (twelve) hours. Indication:  Meningitis First Dose: No Last Day of Therapy:  12/12/2020 Labs - Sunday/Monday:  CBC/D, BMP, and vancomycin trough. Labs - Thursday:  BMP and vancomycin trough Labs - Every other week:  ESR and CRP  Method of administration:Elastomeric Method of administration may be changed at the discretion of the patient and/or caregiver's ability to self-administer the medication ordered.   Vimpat 100 MG Tabs Generic drug: Lacosamide Take 3 tablets (300 mg total) by mouth 2 (two) times daily.               Discharge Care Instructions  (From admission, onward)           Start     Ordered   12/06/20 0000  Change dressing on IV access line weekly and PRN  (Home infusion instructions - [facility] )        12/06/20 0746           No Known Allergies  Follow-up Information     Daylene Posey, MD. Schedule an  appointment as soon as possible for a visit in 1 month(s).   Specialty: Neurology Contact information: [place] [address] [phone]                  The results of significant diagnostics from this hospitalization (including imaging, microbiology, ancillary and laboratory) are listed below for reference.    Significant Diagnostic Studies: CT Head Wo Contrast  Result Date: 11/28/2020 CLINICAL DATA:  40 year old male with headache yesterday, lethargy, seizure. EXAM: CT HEAD WITHOUT CONTRAST TECHNIQUE: Contiguous axial images were obtained from the base of the skull through the vertex without intravenous contrast. COMPARISON:  Head CT 01/20/2018. FINDINGS: Brain: Chronic encephalomalacia in the bilateral inferior frontal gyri is stable since 2019 and more extensive on the right. Mild ex vacuo enlargement of the frontal horns. Cerebral volume elsewhere is stable, within normal limits. No midline shift, ventriculomegaly, mass effect, evidence of mass lesion, intracranial hemorrhage or evidence of cortically based acute infarction. Outside of the inferior frontal lobes gray-white matter differentiation is stable and within normal limits. Vascular: No suspicious intracranial vascular hyperdensity. Skull: Fairly extensive previous bilateral craniotomy changes including bilateral frontotemporal and midline frontal osteotomies. Postoperative changes affect previous frontal bone/frontal sinus cranioplasty. No acute osseous abnormality identified. Sinuses/Orbits: Previous bilateral paranasal sinus disease with generalized mucoperiosteal thickening. Subtotal left maxillary sinus opacification. Some bubbly opacity. Tympanic cavities and mastoids remain clear. Other: No acute orbit or scalp soft tissue finding. IMPRESSION: 1. No acute intracranial abnormality. 2. Extensive remote bilateral frontal craniotomy/cranioplasty with chronic bilateral inferior frontal lobe encephalomalacia. 3.  Previous bilateral paranasal sinus surgery with diffuse acute on chronic sinusitis. Electronically Signed   By: Genevie Ann M.D.   On: 11/28/2020 05:44   MR ANGIO HEAD WO CONTRAST  Result Date: 12/05/2020 CLINICAL DATA:  Neuro deficit, acute stroke suspected EXAM: MRA HEAD WITHOUT CONTRAST TECHNIQUE: Angiographic images of the Circle of Willis were acquired using MRA technique without intravenous contrast. COMPARISON:  Brain MRI dated 1 day prior FINDINGS: Anterior circulation: The intracranial ICAs are patent. The bilateral MCAs are patent. The right A1 segment is diminutive, a normal variant. The bilateral ACAs are patent. There is no aneurysm. Posterior circulation: The bilateral V4 segments are patent. The basilar artery is patent. The bilateral PCAs are patent. Bilateral posterior communicating arteries are identified. There is no aneurysm. Anatomic variants: As above. Other: Bilateral frontal lobe encephalomalacia is again noted. Post surgical changes are noted in the sinuses. IMPRESSION: Patent intracranial vasculature. No significant stenosis, occlusion, or aneurysm. Electronically Signed   By: Valetta Mole M.D.   On: 12/05/2020 18:31   MR BRAIN W WO CONTRAST  Result Date: 12/04/2020 CLINICAL DATA:  Seizure, CSF consistent with meningitis EXAM: MRI HEAD WITHOUT AND WITH CONTRAST TECHNIQUE: Multiplanar, multiecho pulse sequences of the brain and surrounding structures were obtained without and with intravenous contrast. CONTRAST:  34m GADAVIST GADOBUTROL 1 MMOL/ML IV SOLN COMPARISON:  None.  Spells is a vagal nerve stimulator leads remain FINDINGS: Brain: Punctate focus of diffusion hyperintensity in the high left frontal lobe. A few additional punctate foci of extra-axial diffusion hyperintensity posteriorly appear to correspond to vessels. There is no corresponding abnormal signal or enhancement on other sequences. There is no evidence of intracranial hemorrhage. Bilateral anterior frontal  encephalomalacia. There is no intracranial mass, mass effect, or edema. There is no hydrocephalus or extra-axial fluid collection. No abnormal parenchymal or leptomeningeal enhancement. Vascular: Major vessel flow voids at the skull base are preserved. Skull and upper cervical spine: Chronic postoperative changes. Normal marrow signal is preserved. Sinuses/Orbits: Prior sinonasal surgery. Mucosal thickening and layering secretions. Orbits are unremarkable. Other: Sella is unremarkable.  Mastoid air cells are clear. IMPRESSION: Punctate acute or subacute left frontal infarct. No evidence of meningitis complication. Bilateral anterior frontal encephalomalacia. Electronically Signed   By: PMacy MisM.D.   On: 12/04/2020 15:46   EEG adult  Result Date: 11/29/2020 YLora Havens MD     11/29/2020  2:33 PM Patient Name: RChayse ZatarainMRN: [id number] Attending: PLora HavensReferring Physician/Provider: Dr HLynnae SandhoffDate: 11/29/2020 Duration: 24.22 mins Patient history:  40y.o. male PMHx intractable epilepsy with febrile illness and CSF suggesting meningitis and breakthrough seizure.  EEG to evaluate for seizure. Level of alertness: asleep AEDs during EEG study: LEV, LCM, Ativan Technical aspects: This EEG study was done with scalp electrodes positioned according to the 10-20 International system of electrode placement. Electrical activity was acquired at a sampling rate of 500Hz  and reviewed with a high frequency filter of 70Hz  and a low frequency filter of 1Hz . EEG data were recorded continuously and digitally stored. Description: Sleep was characterized by vertex waves, sleep spindles (12 to 14 Hz), maximal frontocentral region. Hyperventilation and photic stimulation were not performed.   IMPRESSION: This study during sleep only is within normal limits. No seizures or epileptiform discharges were seen throughout the recording. Priyanka OBarbra Sarks  Overnight EEG with video  Result  Date: 11/30/2020 YLora Havens MD     11/30/2020 12:32 PM Patient Name: RCorby VandenbergheMRN: [id number] Attending: PLora HavensReferring Physician/Provider: Dr HLynnae SandhoffDuration: 11/29/2020 1239 to 11/30/2020 1123  Patient history:  40y.o. male PMHx intractable epilepsy with febrile illness and CSF suggesting meningitis and breakthrough seizure.  EEG to evaluate for seizure.  Level of alertness: awake, asleep  AEDs during EEG study: LEV, LCM  Technical aspects: This EEG study was done with scalp electrodes positioned according to the 10-20 International system of electrode placement. Electrical activity was acquired at a sampling rate of 500Hz  and reviewed with a high frequency filter of 70Hz  and a low frequency filter of 1Hz . EEG data were recorded continuously and digitally stored.  Description:  The posterior dominant rhythm consists of 9 Hz activity of moderate voltage (25-35 uV) seen predominantly in posterior head regions, symmetric and reactive to eye opening and eye closing. Sleep was characterized by vertex waves, sleep spindles (12 to 14 Hz), maximal frontocentral region.  EEG also showed continuous generalized 15 to 18 Hz beta activity. Hyperventilation and photic stimulation were not performed.   ABNORMALITY -Excessive beta, generalized IMPRESSION: This study is within normal limits. The excessive  beta activity is a benign EEG pattern which can be seen due to benzodiazepine use. No seizures or epileptiform discharges were seen throughout the recording. Lora Havens   ECHOCARDIOGRAM COMPLETE BUBBLE STUDY  Result Date: 12/05/2020    ECHOCARDIOGRAM REPORT   Patient Name:   DESHAN HEMMELGARN Date of Exam: 12/05/2020 Medical Rec #:  [id number]      Height:       71.0 in Accession #:    [id number]     Weight:       145.0 lb Date of Birth:  09-30-1980      BSA:          1.839 m Patient Age:    54 years       BP:           102/54 mmHg Patient Gender: M              HR:           62  bpm. Exam Location:  Inpatient Procedure: 2D Echo, Cardiac Doppler, Color Doppler and Saline Contrast Bubble            Study Indications:    Z17.200 Current smoker; Seizure  History:        Patient has no prior history of Echocardiogram examinations.                 Signs/Symptoms:Seizure; Risk Factors:Current Smoker.  Sonographer:    Glo Herring Referring Phys: [id number] [place]  1. Left ventricular ejection fraction, by estimation, is 55 to 60%. The left ventricle has normal function. The left ventricle has no regional wall motion abnormalities. Left ventricular diastolic parameters were normal.  2. Right ventricular systolic function is normal. The right ventricular size is normal. There is normal pulmonary artery systolic pressure. The estimated right ventricular systolic pressure is 03.4 mmHg.  3. The mitral valve is normal in structure. Trivial mitral valve regurgitation. No evidence of mitral stenosis.  4. The aortic valve is tricuspid. Aortic valve regurgitation is not visualized. No aortic stenosis is present.  5. The inferior vena cava is normal in size with greater than 50% respiratory variability, suggesting right atrial pressure of 3 mmHg.  6. Negative bubble study, no evidence for PFO or ASD. FINDINGS  Left Ventricle: Left ventricular ejection fraction, by estimation, is 55 to 60%. The left ventricle has normal function. The left ventricle has no regional wall motion abnormalities. The left ventricular internal cavity size was normal in size. There is  no left ventricular hypertrophy. Left ventricular diastolic parameters were normal. Right Ventricle: The right ventricular size is normal. No increase in right ventricular wall thickness. Right ventricular systolic function is normal. There is normal pulmonary artery systolic pressure. The tricuspid regurgitant velocity is 2.24 m/s, and  with an assumed right atrial pressure of 3 mmHg, the estimated right ventricular systolic  pressure is 91.7 mmHg. Left Atrium: Left atrial size was normal in size. Right Atrium: Right atrial size was normal in size. Pericardium: Trivial pericardial effusion is present. Mitral Valve: The mitral valve is normal in structure. Trivial mitral valve regurgitation. No evidence of mitral valve stenosis. Tricuspid Valve: The tricuspid valve is normal in structure. Tricuspid valve regurgitation is trivial. Aortic Valve: The aortic valve is tricuspid. Aortic valve regurgitation is not visualized. No aortic stenosis is present. Aortic valve mean gradient measures 5.0 mmHg. Aortic valve peak gradient measures 10.5 mmHg. Pulmonic Valve: The pulmonic valve was normal in structure. Pulmonic valve regurgitation is not  visualized. Aorta: The aortic root is normal in size and structure. Venous: The inferior vena cava is normal in size with greater than 50% respiratory variability, suggesting right atrial pressure of 3 mmHg. IAS/Shunts: Negative bubble study, no evidence for PFO or ASD. Agitated saline contrast was given intravenously to evaluate for intracardiac shunting.  LEFT VENTRICLE PLAX 2D LVIDd:         4.70 cm Diastology LVIDs:         3.20 cm LV e' medial:    11.90 cm/s LV PW:         1.10 cm LV E/e' medial:  6.3 LV IVS:        1.10 cm LV e' lateral:   14.50 cm/s                        LV E/e' lateral: 5.2  RIGHT VENTRICLE             IVC RV Basal diam:  3.40 cm     IVC diam: 1.70 cm RV S prime:     13.60 cm/s LEFT ATRIUM             Index        RIGHT ATRIUM           Index LA diam:        3.30 cm 1.79 cm/m   RA Area:     19.20 cm LA Vol (A2C):   55.0 ml 29.90 ml/m  RA Volume:   54.80 ml  29.80 ml/m LA Vol (A4C):   32.0 ml 17.40 ml/m LA Biplane Vol: 43.6 ml 23.71 ml/m  AORTIC VALVE                    PULMONIC VALVE AV Vmax:           162.00 cm/s  PV Vmax:       0.99 m/s AV Vmean:          106.000 cm/s PV Peak grad:  3.9 mmHg AV VTI:            0.312 m AV Peak Grad:      10.5 mmHg AV Mean Grad:      5.0 mmHg  LVOT Vmax:         114.00 cm/s LVOT Vmean:        71.300 cm/s LVOT VTI:          0.205 m LVOT/AV VTI ratio: 0.66  AORTA Ao Root diam: 2.80 cm MITRAL VALVE               TRICUSPID VALVE MV Area (PHT): 3.40 cm    TR Peak grad:   20.1 mmHg MV Decel Time: 223 msec    TR Vmax:        224.00 cm/s MV E velocity: 75.40 cm/s MV A velocity: 29.10 cm/s  SHUNTS MV E/A ratio:  2.59        Systemic VTI: 0.20 m Dalton McleanMD Electronically signed by Franki Monte Signature Date/Time: 12/05/2020/4:43:37 PM    Final    [place] EKG SITE RITE  Result Date: 12/05/2020 If Site Rite image not attached, placement could not be confirmed due to current cardiac rhythm.   Microbiology: Recent Results (from the past 240 hour(s))  Resp Panel by RT-PCR (Flu A&B, Covid) Nasopharyngeal Swab     Status: None   Collection Time: 11/28/20  4:47 AM   Specimen: Nasopharyngeal Swab; Nasopharyngeal(NP) swabs in vial  transport medium  Result Value Ref Range Status   SARS Coronavirus 2 by RT PCR NEGATIVE NEGATIVE Final    Comment: (NOTE) SARS-CoV-2 target nucleic acids are NOT DETECTED.  The SARS-CoV-2 RNA is generally detectable in upper respiratory specimens during the acute phase of infection. The lowest concentration of SARS-CoV-2 viral copies this assay can detect is 138 copies/mL. A negative result does not preclude SARS-Cov-2 infection and should not be used as the sole basis for treatment or other patient management decisions. A negative result may occur with  improper specimen collection/handling, submission of specimen other than nasopharyngeal swab, presence of viral mutation(s) within the areas targeted by this assay, and inadequate number of viral copies(<138 copies/mL). A negative result must be combined with clinical observations, patient history, and epidemiological information. The expected result is Negative.  Fact Sheet for Patients:  EntrepreneurPulse.com.au  Fact Sheet for Healthcare  Providers:  IncredibleEmployment.be  This test is no t yet approved or cleared by the [place] FDA and  has been authorized for detection and/or diagnosis of SARS-CoV-2 by FDA under an Emergency Use Authorization (EUA). This EUA will remain  in effect (meaning this test can be used) for the duration of the COVID-19 declaration under Section 564(b)(1) of the Act, 21 U.S.C.section 360bbb-3(b)(1), unless the authorization is terminated  or revoked sooner.       Influenza A by PCR NEGATIVE NEGATIVE Final   Influenza B by PCR NEGATIVE NEGATIVE Final    Comment: (NOTE) The Xpert Xpress SARS-CoV-2/FLU/RSV plus assay is intended as an aid in the diagnosis of influenza from Nasopharyngeal swab specimens and should not be used as a sole basis for treatment. Nasal washings and aspirates are unacceptable for Xpert Xpress SARS-CoV-2/FLU/RSV testing.  Fact Sheet for Patients: EntrepreneurPulse.com.au  Fact Sheet for Healthcare Providers: IncredibleEmployment.be  This test is not yet approved or cleared by the [place] FDA and has been authorized for detection and/or diagnosis of SARS-CoV-2 by FDA under an Emergency Use Authorization (EUA). This EUA will remain in effect (meaning this test can be used) for the duration of the COVID-19 declaration under Section 564(b)(1) of the Act, 21 U.S.C. section 360bbb-3(b)(1), unless the authorization is terminated or revoked.  Performed at [facility], [address] [place], [place] [address]   CSF culture     Status: None   Collection Time: 11/28/20  6:21 AM   Specimen: CSF; Cerebrospinal Fluid  Result Value Ref Range Status   Specimen Description CSF  Final   Special Requests BACK  Final   Gram Stain   Final    WBC PRESENT, PREDOMINANTLY PMN NO ORGANISMS SEEN CYTOSPIN SMEAR    Culture   Final    NO GROWTH 3 DAYS Performed at [facility], [place] [address] [place], [place] [address]    Report Status 12/01/2020 FINAL  Final  Culture, blood (routine x 2)     Status: None   Collection Time: 11/28/20  6:45 AM   Specimen: BLOOD  Result Value Ref Range Status   Specimen Description   Final    BLOOD BOTTLES DRAWN AEROBIC AND ANAEROBIC Performed at [facility], [address] [place], [place] [address]    Special Requests   Final    Blood Culture adequate volume BLOOD LEFT WRIST Performed at [facility], [address] [place], [place] [address]    Culture   Final    NO GROWTH 5 DAYS Performed at [facility], [address] [place], [place] [address]    Report Status 12/03/2020 FINAL  Final  Culture, blood (routine x 2)     Status: None   Collection Time: 11/28/20  6:48 AM   Specimen: BLOOD  Result Value Ref Range Status   Specimen Description   Final    BLOOD BOTTLES DRAWN AEROBIC AND ANAEROBIC Performed at [facility], [address] [place], [place] [address]    Special Requests   Final    Blood Culture adequate volume BLOOD LEFT WRIST Performed at [facility], [address] [place], [place] [address]    Culture   Final    NO GROWTH 5 DAYS Performed at [facility], [place] [address], [place], [place] [address]    Report Status 12/03/2020 FINAL  Final  Culture, blood (single) w Reflex to ID Panel     Status: None   Collection Time: 11/29/20  5:26 AM   Specimen: BLOOD LEFT ARM  Result Value Ref Range Status   Specimen Description BLOOD LEFT ARM  Final   Special Requests AEROBIC BOTTLE ONLY Blood Culture adequate volume  Final   Culture   Final    NO GROWTH 5 DAYS Performed at [facility], [place] [address] [place], [place] [address]    Report Status 12/04/2020 FINAL  Final     Labs: Basic Metabolic Panel: Recent Labs  Lab 11/30/20 0137 12/01/20 0151 12/02/20 0401 12/04/20 1038  NA 137 139 137 137  K 3.6 3.5  3.3* 4.1  CL 108 110 107 103  CO2 21* 23 23 25   GLUCOSE 91 110* 110* 88  BUN 11 7 9 6   CREATININE 0.95 0.93 0.82 0.82  CALCIUM 8.2* 8.2* 8.2* 9.0   Liver Function Tests: No results for input(s): AST, ALT, ALKPHOS, BILITOT, PROT, ALBUMIN in the last 168 hours. No results for input(s): LIPASE, AMYLASE in the last 168 hours. No results for input(s): AMMONIA in the last 168 hours. CBC: Recent Labs  Lab 11/30/20 0137  WBC 21.9*  NEUTROABS 17.2*  HGB 12.6*  HCT 36.9*  MCV 87.4  PLT 160   Cardiac Enzymes: No results for input(s): CKTOTAL, CKMB, CKMBINDEX, TROPONINI in the last 168 hours. BNP: BNP (last 3 results) No results for input(s): BNP in the last 8760 hours.  ProBNP (last 3 results) No results for input(s): PROBNP in the last 8760 hours.  CBG: No results for input(s): GLUCAP in the last 168 hours.     Signed:  Domenic Polite MD.  [facility] 12/06/2020, 11:01 AM

## 2020-12-10 LAB — MISC LABCORP TEST (SEND OUT): Labcorp test code: 9985

## 2021-03-24 ENCOUNTER — Inpatient Hospital Stay (HOSPITAL_COMMUNITY): Payer: Medicare HMO

## 2021-03-24 ENCOUNTER — Inpatient Hospital Stay (HOSPITAL_COMMUNITY)
Admission: EM | Admit: 2021-03-24 | Discharge: 2021-03-26 | DRG: 101 | Disposition: A | Discharge status: Home / Self Care | Payer: Medicare HMO | Attending: Internal Medicine | Admitting: Internal Medicine

## 2021-03-24 ENCOUNTER — Emergency Department (HOSPITAL_COMMUNITY): Payer: Medicare HMO

## 2021-03-24 DIAGNOSIS — G40919 Epilepsy, unspecified, intractable, without status epilepticus: Secondary | ICD-10-CM | POA: Diagnosis not present

## 2021-03-24 DIAGNOSIS — G40901 Epilepsy, unspecified, not intractable, with status epilepticus: Principal | ICD-10-CM | POA: Diagnosis present

## 2021-03-24 DIAGNOSIS — Z8661 Personal history of infections of the central nervous system: Secondary | ICD-10-CM

## 2021-03-24 DIAGNOSIS — Z8673 Personal history of transient ischemic attack (TIA), and cerebral infarction without residual deficits: Secondary | ICD-10-CM | POA: Diagnosis not present

## 2021-03-24 DIAGNOSIS — R569 Unspecified convulsions: Secondary | ICD-10-CM

## 2021-03-24 DIAGNOSIS — Z20822 Contact with and (suspected) exposure to covid-19: Secondary | ICD-10-CM | POA: Diagnosis present

## 2021-03-24 DIAGNOSIS — Z79899 Other long term (current) drug therapy: Secondary | ICD-10-CM | POA: Diagnosis not present

## 2021-03-24 LAB — CBC WITH DIFFERENTIAL/PLATELET
Abs Immature Granulocytes: 0.03 10*3/uL (ref 0.00–0.07)
Basophils Absolute: 0.1 10*3/uL (ref 0.0–0.1)
Basophils Relative: 1 %
Eosinophils Absolute: 0.3 10*3/uL (ref 0.0–0.5)
Eosinophils Relative: 3 %
HCT: 43.5 % (ref 39.0–52.0)
Hemoglobin: 14.2 g/dL (ref 13.0–17.0)
Immature Granulocytes: 0 %
Lymphocytes Relative: 41 %
Lymphs Abs: 4.3 10*3/uL — ABNORMAL HIGH (ref 0.7–4.0)
MCH: 29.5 pg (ref 26.0–34.0)
MCHC: 32.6 g/dL (ref 30.0–36.0)
MCV: 90.4 fL (ref 80.0–100.0)
Monocytes Absolute: 0.9 10*3/uL (ref 0.1–1.0)
Monocytes Relative: 9 %
Neutro Abs: 4.9 10*3/uL (ref 1.7–7.7)
Neutrophils Relative %: 46 %
Platelets: 310 10*3/uL (ref 150–400)
RBC: 4.81 MIL/uL (ref 4.22–5.81)
RDW: 13.1 % (ref 11.5–15.5)
WBC: 10.4 10*3/uL (ref 4.0–10.5)
nRBC: 0 % (ref 0.0–0.2)

## 2021-03-24 LAB — COMPREHENSIVE METABOLIC PANEL
ALT: 22 U/L (ref 0–44)
AST: 27 U/L (ref 15–41)
Albumin: 4.2 g/dL (ref 3.5–5.0)
Alkaline Phosphatase: 80 U/L (ref 38–126)
Anion gap: 6 (ref 5–15)
BUN: 11 mg/dL (ref 6–20)
CO2: 26 mmol/L (ref 22–32)
Calcium: 8.9 mg/dL (ref 8.9–10.3)
Chloride: 107 mmol/L (ref 98–111)
Creatinine, Ser: 0.86 mg/dL (ref 0.61–1.24)
GFR, Estimated: >60 mL/min (ref >60)
Glucose, Bld: 128 mg/dL — ABNORMAL HIGH (ref 70–99)
Potassium: 3.5 mmol/L (ref 3.5–5.1)
Sodium: 139 mmol/L (ref 135–145)
Total Bilirubin: 0.1 mg/dL — ABNORMAL LOW (ref 0.3–1.2)
Total Protein: 7.8 g/dL (ref 6.5–8.1)

## 2021-03-24 LAB — RAPID URINE DRUG SCREEN, HOSP PERFORMED
Amphetamines: NOT DETECTED
Barbiturates: NOT DETECTED
Benzodiazepines: POSITIVE — AB
Cocaine: NOT DETECTED
Opiates: NOT DETECTED
Tetrahydrocannabinol: POSITIVE — AB

## 2021-03-24 LAB — URINALYSIS, ROUTINE W REFLEX MICROSCOPIC
Bilirubin Urine: NEGATIVE
Glucose, UA: NEGATIVE mg/dL
Hgb urine dipstick: NEGATIVE
Ketones, ur: NEGATIVE mg/dL
Leukocytes,Ua: NEGATIVE
Nitrite: NEGATIVE
Protein, ur: NEGATIVE mg/dL
Specific Gravity, Urine: 1.018 (ref 1.005–1.030)
pH: 6 (ref 5.0–8.0)

## 2021-03-24 LAB — RESP PANEL BY RT-PCR (FLU A&B, COVID) ARPGX2
Influenza A by PCR: NEGATIVE
Influenza B by PCR: NEGATIVE
SARS Coronavirus 2 by RT PCR: NEGATIVE

## 2021-03-24 LAB — MAGNESIUM: Magnesium: 2 mg/dL (ref 1.7–2.4)

## 2021-03-24 LAB — CBG MONITORING, ED: Glucose-Capillary: 130 mg/dL — ABNORMAL HIGH (ref 70–99)

## 2021-03-24 MED ORDER — SODIUM CHLORIDE 0.9 % IV SOLN
300.0000 mg | Freq: Two times a day (BID) | INTRAVENOUS | Status: DC
  Administered 2021-03-24 – 2021-03-25 (×3): 300 mg via INTRAVENOUS
  Filled 2021-03-24 (×5): qty 30

## 2021-03-24 MED ORDER — ACETAMINOPHEN 650 MG RE SUPP: 650.0000 mg | RECTAL | Status: DC | PRN

## 2021-03-24 MED ORDER — ACETAMINOPHEN 325 MG PO TABS: 650.0000 mg | ORAL_TABLET | ORAL | Status: DC | PRN

## 2021-03-24 MED ORDER — SERTRALINE HCL 50 MG PO TABS
50.0000 mg | ORAL_TABLET | Freq: Every day | ORAL | Status: DC
  Administered 2021-03-25 – 2021-03-26 (×2): 50 mg via ORAL
  Filled 2021-03-24 (×3): qty 1

## 2021-03-24 MED ORDER — SODIUM CHLORIDE 0.9 % IV SOLN: INTRAVENOUS | Status: DC

## 2021-03-24 MED ORDER — ONDANSETRON HCL 4 MG/2ML IJ SOLN: 4.0000 mg | Freq: Four times a day (QID) | INTRAMUSCULAR | Status: DC | PRN

## 2021-03-24 MED ORDER — SODIUM CHLORIDE 0.9 % IV SOLN
150.0000 mg | Freq: Once | INTRAVENOUS | Status: AC
  Administered 2021-03-24: 150 mg via INTRAVENOUS
  Filled 2021-03-24: qty 15

## 2021-03-24 MED ORDER — CHLORHEXIDINE GLUCONATE 0.12% ORAL RINSE (MEDLINE KIT)
15.0000 mL | Freq: Two times a day (BID) | OROMUCOSAL | Status: DC
  Administered 2021-03-26: 15 mL via OROMUCOSAL

## 2021-03-24 MED ORDER — ENOXAPARIN SODIUM 40 MG/0.4ML IJ SOSY
40.0000 mg | PREFILLED_SYRINGE | INTRAMUSCULAR | Status: DC
  Administered 2021-03-24 – 2021-03-25 (×2): 40 mg via SUBCUTANEOUS
  Filled 2021-03-24 (×2): qty 0.4

## 2021-03-24 MED ORDER — CLONAZEPAM 0.25 MG PO TBDP: 0.5000 mg | ORAL_TABLET | Freq: Every day | ORAL | Status: DC | PRN

## 2021-03-24 MED ORDER — LORAZEPAM 2 MG/ML IJ SOLN
4.0000 mg | INTRAMUSCULAR | Status: DC | PRN
  Administered 2021-03-24: 4 mg via INTRAVENOUS
  Filled 2021-03-24: qty 2

## 2021-03-24 MED ORDER — LEVETIRACETAM IN NACL 1000 MG/100ML IV SOLN
1000.0000 mg | Freq: Once | INTRAVENOUS | Status: AC
  Administered 2021-03-24: 1000 mg via INTRAVENOUS
  Filled 2021-03-24: qty 100

## 2021-03-24 MED ORDER — SODIUM CHLORIDE 0.9 % IV SOLN
75.0000 mL/h | INTRAVENOUS | Status: DC
  Administered 2021-03-24 – 2021-03-26 (×4): 75 mL/h via INTRAVENOUS

## 2021-03-24 MED ORDER — VITAMIN B-6 100 MG PO TABS
100.0000 mg | ORAL_TABLET | Freq: Every day | ORAL | Status: DC
Start: 2021-03-24 — End: 2021-03-26
  Administered 2021-03-25 – 2021-03-26 (×2): 100 mg via ORAL
  Filled 2021-03-24 (×3): qty 1

## 2021-03-24 MED ORDER — LORAZEPAM 2 MG/ML IJ SOLN: 4.0000 mg | INTRAMUSCULAR | Status: DC | PRN

## 2021-03-24 MED ORDER — SODIUM CHLORIDE 0.9 % IV SOLN
2500.0000 mg | Freq: Two times a day (BID) | INTRAVENOUS | Status: DC
  Administered 2021-03-24 – 2021-03-25 (×3): 2500 mg via INTRAVENOUS
  Filled 2021-03-24 (×5): qty 25

## 2021-03-24 MED ORDER — CLOBAZAM 10 MG PO TABS
10.0000 mg | ORAL_TABLET | Freq: Two times a day (BID) | ORAL | Status: DC
  Administered 2021-03-25 – 2021-03-26 (×3): 10 mg via ORAL
  Filled 2021-03-24 (×5): qty 1

## 2021-03-24 MED ORDER — SODIUM CHLORIDE 0.9 % IV BOLUS
1000.0000 mL | Freq: Once | INTRAVENOUS | Status: AC
  Administered 2021-03-24: 1000 mL via INTRAVENOUS

## 2021-03-24 MED ORDER — ORAL CARE MOUTH RINSE
15.0000 mL | OROMUCOSAL | Status: DC
  Administered 2021-03-26: 15 mL via OROMUCOSAL

## 2021-03-24 MED ORDER — ZONISAMIDE 100 MG PO CAPS
200.0000 mg | ORAL_CAPSULE | Freq: Every day | ORAL | Status: DC
  Administered 2021-03-25 – 2021-03-26 (×2): 200 mg via ORAL
  Filled 2021-03-24 (×3): qty 2

## 2021-03-24 MED ORDER — ONDANSETRON HCL 4 MG PO TABS: 4.0000 mg | ORAL_TABLET | Freq: Four times a day (QID) | ORAL | Status: DC | PRN

## 2021-03-24 NOTE — ED Notes (Signed)
Wife Roarke Marciano [phone] would like an update asap

## 2021-03-24 NOTE — ED Triage Notes (Signed)
Pt BIBA from home for seizures. Hx of same, wife reports he usually has one every 6 months, last in October. Had one this am around 0745, home meds given and vagal nerve stimulator but pt did not return to baseline by 67m per usual. EMS reports pt had another seizure 57m pta lasting ~40m, another on arrival. Attempted stimulator to no avail. VSS. EDP at bedside. 20ga LF

## 2021-03-24 NOTE — ED Notes (Signed)
RN called and updated pt's wife, Caryl Pina.

## 2021-03-24 NOTE — H&P (Signed)
History and Physical    Adam [name] HOZ:[id number] DOB: 06-26-1980 DOA: 03/24/2021  PCP: Patient, No Pcp Per (Inactive) (Confirm with patient/family/NH records and if not entered, this has to be entered at [facility] point of entry) Patient coming from: Home  I have personally briefly reviewed patient's old medical records in [place]  Chief Complaint: Patient unable to talk  HPI: Adam [name] is a 41 y.o. 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  Patient unable to answer any questions, will history provided by patient's wife at bedside.  Wife denied patient has new medications.  As per wife, patient has been compliant with Keppra and Vimpat at home, and patient was seen at baseline last night.  This morning, wife found patient hard to arouse, and she immediately responded by giving him one dose of intra-nose antizseizure meds, and triggered the vagal nerve stimulatorx1 and called EMS. EMS came in about 20 min and found the patient still not recovering consciousness. Then it appeared that patient had tonic-clonic seizure in the ED was given Ativan x2, loading dose of Keppra and Vimpat.  At that time as well the patient, patient somnolent, wife reported patient is able to respond to voice and able to squeeze her fingers.   Review of Systems: Unable to perform, patient somnolent.  Past Medical History:  Diagnosis Date   Marijuana smoker, continuous 01/10/2018   Seizures ([address])     Past Surgical History:  Procedure Laterality Date   NASAL SINUS SURGERY  2002   VAGUS NERVE STIMULATOR INSERTION Left 2021     reports that he has never smoked. He has never used smokeless tobacco. He reports current alcohol use. He reports current drug use. Drug: Marijuana.  No Known Allergies  Family History  Problem Relation Age of Onset   Seizures Neg Hx       Prior to Admission medications   Medication Sig Start Date End Date Taking? Authorizing Provider  cloBAZam (ONFI) 20 MG tablet Take 10 mg by mouth 2 (two) times daily. 11/02/20  Yes [provider]  clonazePAM (KLONOPIN) 0.5 MG disintegrating tablet Take 0.5 mg by mouth daily as needed for seizure. 09/20/20  Yes [provider]  Lacosamide (VIMPAT) 100 MG TABS Take 3 tablets (300 mg total) by mouth 2 (two) times daily. 11/18/18 03/24/21 Yes Danford, Suann Larry, MD  levETIRAcetam (KEPPRA) 1000 MG tablet Take 2,500 mg by mouth 2 (two) times daily. 10/27/18  Yes [provider]  LORazepam (ATIVAN) 2 MG/ML injection Inject 1 mL (2 mg total) into the muscle once as needed for up to 1 dose for seizure. 09/18/20  Yes Arnaldo Natal, MD  pyridoxine (B-6) 100 MG tablet Take 100 mg by mouth daily.   Yes [provider]  sertraline (ZOLOFT) 50 MG tablet Take 50 mg by mouth daily. 11/15/20  Yes [provider]  zonisamide (ZONEGRAN) 100 MG capsule Take 200 mg by mouth daily. 03/06/21  Yes [provider]  cefTRIAXone (ROCEPHIN) IVPB Inject 2 g into the vein every 12 (twelve) hours. Indication:  Meningitis First Dose: No Last Day of Therapy:  12/12/2020 Labs - Once weekly:  CBC/D and BMP, Labs - Every other week:  ESR and CRP Method of administration: IV Push Method of administration may be changed at the discretion of home infusion pharmacist based upon assessment of the patient and/or caregiver's ability to self-administer  the medication ordered. Patient not taking: Reported on 03/24/2021 12/06/20   Domenic Polite, MD  vancomycin IVPB Inject 1,500 mg into the vein every 12 (twelve) hours. Indication:  Meningitis First Dose: No Last Day of Therapy:  12/12/2020 Labs - Sunday/Monday:  CBC/D, BMP, and vancomycin trough. Labs - Thursday:  BMP and vancomycin trough Labs - Every other week:  ESR and CRP Method of administration:Elastomeric Method of  administration may be changed at the discretion of the patient and/or caregiver's ability to self-administer the medication ordered. Patient not taking: Reported on 03/24/2021 12/06/20   Domenic Polite, MD    Physical Exam: Vitals:   03/24/21 1130 03/24/21 1145 03/24/21 1200 03/24/21 1230  BP: 112/70 103/60 103/69 121/76  Pulse: 66 72 72 74  Resp: 16 (!) 22 20 18   Temp:      TempSrc:      SpO2: 97% 99% 99% 100%    Constitutional: NAD, calm, comfortable Vitals:   03/24/21 1130 03/24/21 1145 03/24/21 1200 03/24/21 1230  BP: 112/70 103/60 103/69 121/76  Pulse: 66 72 72 74  Resp: 16 (!) 22 20 18   Temp:      TempSrc:      SpO2: 97% 99% 99% 100%   Eyes: PERRL, lids and conjunctivae normal ENMT: Mucous membranes are moist. Posterior pharynx clear of any exudate or lesions.Normal dentition.  Neck: normal, supple, no masses, no thyromegaly Respiratory: clear to auscultation bilaterally, no wheezing, no crackles. Normal respiratory effort. No accessory muscle use.  Cardiovascular: Regular rate and rhythm, no murmurs / rubs / gallops. No extremity edema. 2+ pedal pulses. No carotid bruits.  Abdomen: no tenderness, no masses palpated. No hepatosplenomegaly. Bowel sounds positive.  Musculoskeletal: no clubbing / cyanosis. No joint deformity upper and lower extremities. Good ROM, no contractures. Normal muscle tone.  Skin: no rashes, lesions, ulcers. No induration Neurologic: sedated Psychiatric: sedated    Labs on Admission: I have personally reviewed following labs and imaging studies  CBC: Recent Labs  Lab 03/24/21 1000  WBC 10.4  NEUTROABS 4.9  HGB 14.2  HCT 43.5  MCV 90.4  PLT 641   Basic Metabolic Panel: Recent Labs  Lab 03/24/21 1000  NA 139  K 3.5  CL 107  CO2 26  GLUCOSE 128*  BUN 11  CREATININE 0.86  CALCIUM 8.9  MG 2.0   GFR: CrCl cannot be calculated (Unknown ideal weight.). Liver Function Tests: Recent Labs  Lab 03/24/21 1000  AST 27  ALT 22   ALKPHOS 80  BILITOT 0.1*  PROT 7.8  ALBUMIN 4.2   No results for input(s): LIPASE, AMYLASE in the last 168 hours. No results for input(s): AMMONIA in the last 168 hours. Coagulation Profile: No results for input(s): INR, PROTIME in the last 168 hours. Cardiac Enzymes: No results for input(s): CKTOTAL, CKMB, CKMBINDEX, TROPONINI in the last 168 hours. BNP (last 3 results) No results for input(s): PROBNP in the last 8760 hours. HbA1C: No results for input(s): HGBA1C in the last 72 hours. CBG: Recent Labs  Lab 03/24/21 0935  GLUCAP 130*   Lipid Profile: No results for input(s): CHOL, HDL, LDLCALC, TRIG, CHOLHDL, LDLDIRECT in the last 72 hours. Thyroid Function Tests: No results for input(s): TSH, T4TOTAL, FREET4, T3FREE, THYROIDAB in the last 72 hours. Anemia Panel: No results for input(s): VITAMINB12, FOLATE, FERRITIN, TIBC, IRON, RETICCTPCT in the last 72 hours. Urine analysis:    Component Value Date/Time   COLORURINE STRAW (A) 11/16/2018 1113   APPEARANCEUR CLEAR 11/16/2018 1113  LABSPEC 1.014 11/16/2018 1113   PHURINE 6.0 11/16/2018 1113   GLUCOSEU 150 (A) 11/16/2018 1113   HGBUR SMALL (A) 11/16/2018 1113   BILIRUBINUR NEGATIVE 11/16/2018 [place] 11/16/2018 1113   PROTEINUR NEGATIVE 11/16/2018 1113   NITRITE NEGATIVE 11/16/2018 1113   LEUKOCYTESUR NEGATIVE 11/16/2018 1113    Radiological Exams on Admission: DG Chest Portable 1 View  Result Date: 03/24/2021 CLINICAL DATA:  Shortness of breath. EXAM: PORTABLE CHEST 1 VIEW COMPARISON:  11/17/2018 FINDINGS: The lungs are clear without focal pneumonia, edema, pneumothorax or pleural effusion. The cardiopericardial silhouette is within normal limits for size. Prominent gastric bubble. Battery pack for left-sided stimulator device noted. The visualized bony structures of the thorax show no acute abnormality. Telemetry leads overlie the chest. IMPRESSION: No active disease. Electronically Signed   By: Misty Stanley M.D.   On: 03/24/2021 10:56    EKG: Independently reviewed. Sinus, no acute ST-T changes.  Assessment/Plan Principal Problem:   Seizure [facility]) Active Problems:   Breakthrough seizure ([place])  (please populate well all problems here in Problem List. (For example, if patient is on BP meds at home and you resume or decide to hold them, it is a problem that needs to be her. Same for CAD, COPD, HLD and so on)  Breakthrough seizure, now in status epilepticus -According to family, patient has been compliant with both Keppra and Vimpat. Noticed that patient continues to use marijuana, which can potentially lower seizure threshold. -For now, patient will be admitted to PCU and neurology ordered 24-hour EEG/video surveillance. -Change Keppra and Vimpat to IV twice daily.  Check Keppra and Vimpat levels -Will D/W patient regarding safety of marijuana use. -Other Ddx, patient had meningitis along with seizure in Oct 2022. This time, there is no fever, no leukocytosis and no significant neck stiffness, clinically low suspicion for meningitis.   Marijuana use -As above.  DVT prophylaxis: LOvenox Code Status: Full code Family Communication: Wife at bedside Disposition Plan: Expect more than 2 midnight hospital stay for close EEG monitoring and treatment of seizure. Consults called: Dr. Leonel Ramsay Admission status: PCU   Lequita Halt MD [facility] Pager [phone]  03/24/2021, 12:50 PM

## 2021-03-24 NOTE — ED Notes (Signed)
RN noticed pt's door was closed, cardiac monitor alarm that leads were off. RN went to bedside and pt was standing next to bed, naked. Pt had pulled off cardiac monitoring, pulse ox and BP cuff. Pt was attempted to take out his IV. RN assisted pt back into bed, re-dressed IV and placed pt back on monitor, pt now ao to self only.

## 2021-03-24 NOTE — ED Provider Notes (Signed)
[place] DEPT Provider Note   CSN: [id number] Arrival date & time: 03/24/21  2263     History  Chief Complaint  Patient presents with   Seizures    Jaymason Ledesma is a 41 y.o. male.  Pt is 41 yo bm with a hx of seizures.  He normally takes keppra, vimpat, and onfi and has been compliant.  He was admitted in October for status epilepticus, but was found to have bacterial meningitis as well.  Pt's wife said she woke up to him having a seizure this morning around 0800.  She gave him an IN dose of meds and tried to zap his vagal nerve stimulator.  He still kept seizing, so she called EMS.  Pt was post-ictal when EMS arrived and did not regain consciousness.  He had another seizure when he arrived here.  Seizure stopped, but he was still pos-ictal.  No additional meds given at this point.  Pt is unable to give any history.      Home Medications Prior to Admission medications   Medication Sig Start Date End Date Taking? Authorizing Provider  cloBAZam (ONFI) 20 MG tablet Take 10 mg by mouth 2 (two) times daily. 11/02/20  Yes [provider]  clonazePAM (KLONOPIN) 0.5 MG disintegrating tablet Take 0.5 mg by mouth daily as needed for seizure. 09/20/20  Yes [provider]  Lacosamide (VIMPAT) 100 MG TABS Take 3 tablets (300 mg total) by mouth 2 (two) times daily. 11/18/18 03/24/21 Yes Danford, Suann Larry, MD  levETIRAcetam (KEPPRA) 1000 MG tablet Take 2,500 mg by mouth 2 (two) times daily. 10/27/18  Yes [provider]  LORazepam (ATIVAN) 2 MG/ML injection Inject 1 mL (2 mg total) into the muscle once as needed for up to 1 dose for seizure. 09/18/20  Yes Arnaldo Natal, MD  pyridoxine (B-6) 100 MG tablet Take 100 mg by mouth daily.   Yes [provider]  sertraline (ZOLOFT) 50 MG tablet Take 50 mg by mouth daily. 11/15/20  Yes [provider]  zonisamide (ZONEGRAN) 100 MG capsule Take 200 mg by mouth daily. 03/06/21  Yes  [provider]  cefTRIAXone (ROCEPHIN) IVPB Inject 2 g into the vein every 12 (twelve) hours. Indication:  Meningitis First Dose: No Last Day of Therapy:  12/12/2020 Labs - Once weekly:  CBC/D and BMP, Labs - Every other week:  ESR and CRP Method of administration: IV Push Method of administration may be changed at the discretion of home infusion pharmacist based upon assessment of the patient and/or caregiver's ability to self-administer the medication ordered. Patient not taking: Reported on 03/24/2021 12/06/20   Domenic Polite, MD  vancomycin IVPB Inject 1,500 mg into the vein every 12 (twelve) hours. Indication:  Meningitis First Dose: No Last Day of Therapy:  12/12/2020 Labs - Sunday/Monday:  CBC/D, BMP, and vancomycin trough. Labs - Thursday:  BMP and vancomycin trough Labs - Every other week:  ESR and CRP Method of administration:Elastomeric Method of administration may be changed at the discretion of the patient and/or caregiver's ability to self-administer the medication ordered. Patient not taking: Reported on 03/24/2021 12/06/20   Domenic Polite, MD      Allergies    Patient has no known allergies.    Review of Systems   Review of Systems  Neurological:  Positive for seizures.  All other systems reviewed and are negative.  Physical Exam Updated Vital Signs BP 110/80    Pulse 74    Temp Marland Kitchen)  96.8 F (36 C) (Axillary)    Resp 18    SpO2 100%  Physical Exam Vitals and nursing note reviewed.  Constitutional:      General: He is in acute distress.     Appearance: He is ill-appearing, toxic-appearing and diaphoretic.     Comments: Post-ictal  HENT:     Head: Normocephalic and atraumatic.     Right Ear: External ear normal.     Left Ear: External ear normal.     Nose: Nose normal.     Mouth/Throat:     Mouth: Mucous membranes are dry.  Eyes:     Extraocular Movements: Extraocular movements intact.     Conjunctiva/sclera: Conjunctivae normal.     Pupils:  Pupils are equal, round, and reactive to light.  Cardiovascular:     Rate and Rhythm: Normal rate and regular rhythm.     Pulses: Normal pulses.     Heart sounds: Normal heart sounds.  Pulmonary:     Effort: Pulmonary effort is normal.     Breath sounds: Normal breath sounds.  Abdominal:     General: Abdomen is flat. Bowel sounds are normal.     Palpations: Abdomen is soft.  Musculoskeletal:        General: Normal range of motion.     Cervical back: Normal range of motion and neck supple.  Skin:    Capillary Refill: Capillary refill takes less than 2 seconds.  Neurological:     Comments: Post-ictal    ED Results / Procedures / Treatments   Labs (all labs ordered are listed, but only abnormal results are displayed) Labs Reviewed  COMPREHENSIVE METABOLIC PANEL - Abnormal; Notable for the following components:      Result Value   Glucose, Bld 128 (*)    Total Bilirubin 0.1 (*)    All other components within normal limits  CBC WITH DIFFERENTIAL/PLATELET - Abnormal; Notable for the following components:   Lymphs Abs 4.3 (*)    All other components within normal limits  CBG MONITORING, ED - Abnormal; Notable for the following components:   Glucose-Capillary 130 (*)    All other components within normal limits  RESP PANEL BY RT-PCR (FLU A&B, COVID) ARPGX2  MAGNESIUM  URINALYSIS, ROUTINE W REFLEX MICROSCOPIC  RAPID URINE DRUG SCREEN, HOSP PERFORMED    EKG EKG Interpretation  Date/Time:  Sunday March 24 2021 09:31:58 EST Ventricular Rate:  61 PR Interval:  133 QRS Duration: 99 QT Interval:  409 QTC Calculation: 412 R Axis:   69 Text Interpretation: Sinus rhythm No significant change since last tracing Confirmed by Isla Pence [phone]) on 03/24/2021 9:40:10 AM  Radiology No results found.  Procedures Procedures    Medications Ordered in ED Medications  sodium chloride 0.9 % bolus 1,000 mL (1,000 mLs Intravenous New Bag/Given 03/24/21 0947)    And  0.9 %   sodium chloride infusion (has no administration in time range)  LORazepam (ATIVAN) injection 4 mg (4 mg Intravenous Given 03/24/21 0947)  lacosamide (VIMPAT) 150 mg in sodium chloride 0.9 % 25 mL IVPB (150 mg Intravenous New Bag/Given 03/24/21 1028)  levETIRAcetam (KEPPRA) IVPB 1000 mg/100 mL premix (0 mg Intravenous Stopped 03/24/21 1031)    ED Course/ Medical Decision Making/ A&P                           Medical Decision Making Amount and/or Complexity of Data Reviewed Labs: ordered. Radiology: ordered.  Risk Prescription drug management.  This patient presents to the ED for concern of status epilepticus, this involves an extensive number of treatment options, and is a complaint that carries with it a high risk of complications and morbidity.  The differential diagnosis includes seizure, metabolic abn   Co morbidities that complicate the patient evaluation  Seizure d/o   Additional history obtained:  Additional history obtained from epic chart review External records from outside source obtained and reviewed including wife   Lab Tests:  I Ordered, and personally interpreted labs.  The pertinent results include:  cbg nl, cbc nl, cmp nl, mg nl, covid/flu neg   Imaging Studies ordered:  I ordered imaging studies including cxr  I independently visualized and interpreted imaging which showed nothing acute I agree with the radiologist interpretation   Cardiac Monitoring:  The patient was maintained on a cardiac monitor.  I personally viewed and interpreted the cardiac monitored which showed an underlying rhythm of: nsr   Medicines ordered and prescription drug management:  I ordered medication including ativan, keppra, vimpat  for seizure  Reevaluation of the patient after these medicines showed that the patient improved I have reviewed the patients home medicines.  Test Considered:  Stat EEG   Critical Interventions:  Multiple IV medications to stop  seizure   Consultations Obtained:  I requested consultation with the neurology (Dr. Leonel Ramsay),  and discussed lab and imaging findings as well as pertinent plan - they recommend: transfer to [facility] ED for stat EEG.  He will see pt when pt gets there.   Problem List / ED Course:  Status epilepticus:  No current seizing, but pt remains post-ictal.  Plan for transfer to [facility] ED for stat EEG and neurology consult.  Dr. Melina Copa at [facility] ED accepting.  Pt's wife aware of plan.   Reevaluation:  After the interventions noted above, I reevaluated the patient and found that they have :stayed the same   Social Determinants of Health:  Good social support   Dispostion:  After consideration of the diagnostic results and the patients response to treatment, I feel that the patent would benefit from transfer to [facility] ED and admission.  Pt is stable for transfer.   CRITICAL CARE Performed by: Isla Pence   Total critical care time: 45 minutes  Critical care time was exclusive of separately billable procedures and treating other patients.  Critical care was necessary to treat or prevent imminent or life-threatening deterioration.  Critical care was time spent personally by me on the following activities: development of treatment plan with patient and/or surrogate as well as nursing, discussions with consultants, evaluation of patient's response to treatment, examination of patient, obtaining history from patient or surrogate, ordering and performing treatments and interventions, ordering and review of laboratory studies, ordering and review of radiographic studies, pulse oximetry and re-evaluation of patient's condition. .   Final Clinical Impression(s) / ED Diagnoses Final diagnoses:  Status epilepticus [facility])    Rx / [place] Orders ED Discharge Orders     None         Isla Pence, MD 03/24/21 1047

## 2021-03-24 NOTE — ED Provider Notes (Signed)
[facility] EMERGENCY DEPARTMENT Provider Note   CSN: [id number] Arrival date & time: 03/24/21  2751     History  Chief Complaint  Patient presents with   Seizures    Adam [name] is a 41 y.o. male.  HPI    41 year old male history of seizures presents today in transfer from [facility] for neurology evaluation and EEG.  Report reviewed from Dr. Gilford Raid, at [facility], and received verbal report from [place] team.  Patient reportedly has history of seizures.  He is on Vimpat, Keppra, and has vagal nerve stimulator.  Is reportedly taking his medications as prescribed.  He had breakthrough seizures today and has not really returned to baseline.  Has received 8 mg of Ativan, 1 g of Keppra and Vimpat is infusing.  Patient has generally decreased responsiveness at this time without any focal deficits noted.  Home Medications Prior to Admission medications   Medication Sig Start Date End Date Taking? Authorizing Provider  cloBAZam (ONFI) 20 MG tablet Take 10 mg by mouth 2 (two) times daily. 11/02/20  Yes [provider]  clonazePAM (KLONOPIN) 0.5 MG disintegrating tablet Take 0.5 mg by mouth daily as needed for seizure. 09/20/20  Yes [provider]  Lacosamide (VIMPAT) 100 MG TABS Take 3 tablets (300 mg total) by mouth 2 (two) times daily. 11/18/18 03/24/21 Yes Danford, Suann Larry, MD  levETIRAcetam (KEPPRA) 1000 MG tablet Take 2,500 mg by mouth 2 (two) times daily. 10/27/18  Yes [provider]  LORazepam (ATIVAN) 2 MG/ML injection Inject 1 mL (2 mg total) into the muscle once as needed for up to 1 dose for seizure. 09/18/20  Yes Arnaldo Natal, MD  pyridoxine (B-6) 100 MG tablet Take 100 mg by mouth daily.   Yes [provider]  sertraline (ZOLOFT) 50 MG tablet Take 50 mg by mouth daily. 11/15/20  Yes [provider]  zonisamide (ZONEGRAN) 100 MG capsule Take 200 mg by mouth daily. 03/06/21  Yes [provider]   cefTRIAXone (ROCEPHIN) IVPB Inject 2 g into the vein every 12 (twelve) hours. Indication:  Meningitis First Dose: No Last Day of Therapy:  12/12/2020 Labs - Once weekly:  CBC/D and BMP, Labs - Every other week:  ESR and CRP Method of administration: IV Push Method of administration may be changed at the discretion of home infusion pharmacist based upon assessment of the patient and/or caregiver's ability to self-administer the medication ordered. Patient not taking: Reported on 03/24/2021 12/06/20   Domenic Polite, MD  vancomycin IVPB Inject 1,500 mg into the vein every 12 (twelve) hours. Indication:  Meningitis First Dose: No Last Day of Therapy:  12/12/2020 Labs - Sunday/Monday:  CBC/D, BMP, and vancomycin trough. Labs - Thursday:  BMP and vancomycin trough Labs - Every other week:  ESR and CRP Method of administration:Elastomeric Method of administration may be changed at the discretion of the patient and/or caregiver's ability to self-administer the medication ordered. Patient not taking: Reported on 03/24/2021 12/06/20   Domenic Polite, MD      Allergies    Patient has no known allergies.    Review of Systems   Review of Systems  Unable to perform ROS: Mental status change   Physical Exam Updated Vital Signs BP 121/76    Pulse 74    Temp (!) 96.8 F (36 C) (Axillary)    Resp 18    SpO2 100%  Physical Exam Vitals and nursing note reviewed.  Constitutional:  General: He is not in acute distress.    Appearance: He is ill-appearing.     Comments: Patient appears to be generally normally developed He is curled on the side in the bed.  HENT:     Head: Normocephalic and atraumatic.     Right Ear: External ear normal.     Left Ear: External ear normal.  Eyes:     Pupils: Pupils are equal, round, and reactive to light.  Cardiovascular:     Rate and Rhythm: Normal rate and regular rhythm.     Pulses: Normal pulses.  Pulmonary:     Effort: Pulmonary effort is normal.      Comments: Some mild upper airway stridor noted with radiation to left upper lobe Abdominal:     General: Abdomen is flat.     Palpations: Abdomen is soft.  Musculoskeletal:     Cervical back: Normal range of motion.     Comments: Patient appears to move all 4 extremities no obvious external signs of trauma are noted  Skin:    General: Skin is warm and dry.     Capillary Refill: Capillary refill takes less than 2 seconds.  Neurological:     Comments: Patient is controlled on side.  He does respond to uncomfortable stimuli such as cold stethoscope and seems to localize.  There is no verbal response at this time.  Does not follow directions currently  Psychiatric:        Mood and Affect: Mood normal.    ED Results / Procedures / Treatments   Labs (all labs ordered are listed, but only abnormal results are displayed) Labs Reviewed  COMPREHENSIVE METABOLIC PANEL - Abnormal; Notable for the following components:      Result Value   Glucose, Bld 128 (*)    Total Bilirubin 0.1 (*)    All other components within normal limits  CBC WITH DIFFERENTIAL/PLATELET - Abnormal; Notable for the following components:   Lymphs Abs 4.3 (*)    All other components within normal limits  CBG MONITORING, ED - Abnormal; Notable for the following components:   Glucose-Capillary 130 (*)    All other components within normal limits  RESP PANEL BY RT-PCR (FLU A&B, COVID) ARPGX2  MAGNESIUM  URINALYSIS, ROUTINE W REFLEX MICROSCOPIC  RAPID URINE DRUG SCREEN, HOSP PERFORMED    EKG EKG Interpretation  Date/Time:  Sunday March 24 2021 09:31:58 EST Ventricular Rate:  61 PR Interval:  133 QRS Duration: 99 QT Interval:  409 QTC Calculation: 412 R Axis:   69 Text Interpretation: Sinus rhythm No significant change since last tracing Confirmed by Isla Pence [phone]) on 03/24/2021 9:40:10 AM  Radiology DG Chest Portable 1 View  Result Date: 03/24/2021 CLINICAL DATA:  Shortness of breath. EXAM: PORTABLE  CHEST 1 VIEW COMPARISON:  11/17/2018 FINDINGS: The lungs are clear without focal pneumonia, edema, pneumothorax or pleural effusion. The cardiopericardial silhouette is within normal limits for size. Prominent gastric bubble. Battery pack for left-sided stimulator device noted. The visualized bony structures of the thorax show no acute abnormality. Telemetry leads overlie the chest. IMPRESSION: No active disease. Electronically Signed   By: Misty Stanley M.D.   On: 03/24/2021 10:56    Procedures Procedures    Medications Ordered in ED Medications  sodium chloride 0.9 % bolus 1,000 mL (1,000 mLs Intravenous New Bag/Given 03/24/21 0947)    And  0.9 %  sodium chloride infusion (has no administration in time range)  LORazepam (ATIVAN) injection 4 mg (4 mg Intravenous  Given 03/24/21 0947)  levETIRAcetam (KEPPRA) IVPB 1000 mg/100 mL premix (0 mg Intravenous Stopped 03/24/21 1031)  lacosamide (VIMPAT) 150 mg in sodium chloride 0.9 % 25 mL IVPB (0 mg Intravenous Stopped 03/24/21 1222)    ED Course/ Medical Decision Making/ A&P Clinical Course as of 03/24/21 1245  Sun Mar 24, 2021  1110 CBC with Differential/Platelet(!) CT reviewed interpreted normal Exam reviewed interpreted normal [DR]  1110 Comprehensive metabolic panel(!) Chemistry panel reviewed with normal electrolytes, mildly elevated glucose, otherwise within normal limits  [DR]  1110 Resp Panel by RT-PCR (Flu A&B, Covid) Nasopharyngeal Swab COVID and flu negative [DR]  1110 Chest x-Roald Lukacs reviewed and interpretation by radiologist reviewed that appears to be normal [DR]  1112 Reviewed discharge summary from last admission,, at that time patient had breakthrough seizures and acute bacterial meningitis.  His LP was suggestive of bacterial meningitis with cloudy CSF, white blood cells 4743 with 96% neutrophils, increased protein, decreased glucose, at time of discharge CSF cultures remained negative.  Will review chart for growth from CSF. CSF  culture 11/28/2020 no growth patient was treated with acyclovir, vancomycin, and ceftriaxone. Additionally patient was noted to have had CVA with acute to subacute left frontal infarct prior to discharge at that time.  Unclear whether or not those are incidental findings noted to meningitis.  He had aspirin, 2D echo with normal EF and no cardiac source of embolism noted, carotid duplex and patent intra cranial vasculature noted.  [DR]    Clinical Course User Index [DR] Pattricia Boss, MD                           Medical Decision Making 41 year old male with known seizure disorder presents today with status epilepticus.  He was transferred from [place] to [facility] for neurology to evaluate and obtain EEG.  Patient was seen in consultation by Dr. Leonel Ramsay.  He is ordered overnight EEG. Reviewed history and reviewed labs. Dr. Leonel Ramsay does not feel that lumbar puncture is indicated at this time. Plan admission to medicine service for ongoing evaluation, treatment, and ongoing neurology evaluation.  Amount and/or Complexity of Data Reviewed Labs: ordered. Decision-making details documented in ED Course. Radiology: ordered. Discussion of management or test interpretation with external provider(s): Discussed patient management with Dr. Leonel Ramsay, on-call for neurology Discussed care with Dr. Roosevelt Locks, on-call for hospitalist.  He will see patient for admission.  Risk Prescription drug management.          Final Clinical Impression(s) / ED Diagnoses Final diagnoses:  Status epilepticus [facility])    Rx / DC Orders ED Discharge Orders     None         Pattricia Boss, MD 03/24/21 1245

## 2021-03-24 NOTE — Progress Notes (Signed)
Started cEEG study.  Tested patient event button.  °

## 2021-03-24 NOTE — ED Triage Notes (Addendum)
Pt transferred from [facility]. Hx of seizures. Pt was post ictal for 20 min and has had 4 witnessed seizures. RN is post ictal on arrival. Pt on 4L [place].

## 2021-03-24 NOTE — ED Notes (Signed)
EEG at bedside.

## 2021-03-24 NOTE — ED Notes (Addendum)
Report given to [facility] ED charge and notified of transfer.

## 2021-03-24 NOTE — Consult Note (Signed)
Neurology Consultation Reason for Consult: Seizures Referring Physician: Jeanell Sparrow, D  CC: Seizures  History is obtained from: Chart review  HPI: Adhiraj Yuill is a 41 y.o. male with a history of seizures who presents with recurrent seizures without return to baseline.  He takes Keppra, Vimpat and Onfi at baseline.  His wife woke up to him having a seizure this morning around 8 AM.  She gave him intranasal Versed and activated his vagal nerve stimulator.  He continued having a seizure and therefore EMS was activated.  He has been postictal since that time.  After arrival to [facility], he had a recurrent seizure but was not recovering consciousness and therefore has been transferred to [facility] for stat EEG.    ROS:  Unable to obtain due to altered mental status.   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 He has never used smokeless tobacco. He reports current alcohol use. He reports current drug use. Drug: Marijuana.   Exam: Current vital signs: BP 112/70    Pulse 66    Temp (!) 96.8 F (36 C) (Axillary)    Resp 16    SpO2 97%  Vital signs in last 24 hours: Temp:  [96.8 F (36 C)] 96.8 F (36 C) (02/12 0943) Pulse Rate:  [62-74] 66 (02/12 1130) Resp:  [12-19] 16 (02/12 1130) BP: (107-168)/(70-82) 112/70 (02/12 1130) SpO2:  [93 %-100 %] 97 % (02/12 1130)   Physical Exam  Constitutional: Appears well-developed and well-nourished.  Psych: Affect appropriate to situation Eyes: No scleral injection HENT: No OP obstruction MSK: no joint deformities.  Cardiovascular: Normal rate and regular rhythm.  Respiratory: Effort normal, non-labored breathing GI: Soft.  No distension. There is no tenderness.  Skin: WDI  Neuro: Mental Status: Patient is obtunded, does not engage examiner or follow  commands, but does have clearly puropseful movements bilatearlly.  Cranial Nerves: II: He does not bite to threat. Pupils are equal, round, and reactive to light.   III,IV, VI: Eyes are midline, but he does not fixate or track, resists doll's maneuver.  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  He does not have any active clinical signs of seizure, but given that he is continuing to be encephalopathic, I would favor observation and continuous EEG to rule out subclinical seizures.  Recommendations: 1) continue Keppra 2500 mg twice daily 2) continue Vimpat 300 mg twice daily 3) continue pyridoxine 100 mg daily 4) once able to take p.o., continue clobazam 10 mg twice daily(he received Ativan this morning which should substitute.) 5) overnight EEG  6) neurology will continue to follow   Roland Rack, MD [facility] [phone]  If 7pm- 7am, please page neurology on call as listed in [place].

## 2021-03-25 ENCOUNTER — Encounter (HOSPITAL_COMMUNITY): Payer: Self-pay | Admitting: Internal Medicine

## 2021-03-25 ENCOUNTER — Other Ambulatory Visit: Payer: Self-pay

## 2021-03-25 DIAGNOSIS — G40919 Epilepsy, unspecified, intractable, without status epilepticus: Secondary | ICD-10-CM

## 2021-03-25 NOTE — Progress Notes (Signed)
Progress Note   Patient: Adam [name] TZG:[id number] DOB: [birth year] DOA: 03/24/2021     1 DOS: the patient was seen and examined on 03/25/2021   Brief hospital course: Adam [name] is a 41 y.o. 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  Assessment and Plan:  Status epilepticus:  As per family pt is compliant to anti epileptic meds both keppra and vimpat.  Pt also continues to use marijuana, which can potentially lower seizure threshold.  Neurology on board and pt to be on 24 hour EEG / video surveillance.  Continue to monitor.     Subjective: no headache or new complaints.   Physical Exam: Vitals:   03/25/21 0500 03/25/21 0530 03/25/21 0700 03/25/21 0851  BP: 107/60 (!) 112/97 128/73 122/77  Pulse: 74 62 71 64  Resp: 19 (!) 23 (!) 23 18  Temp:    98.4 F (36.9 C)  TempSrc:    Oral  SpO2: 96% 96% 100% 100%   General exam: Appears calm and comfortable  Respiratory system: Clear to auscultation. Respiratory effort normal. Cardiovascular system: S1 & S2 heard, RRR. No JVD,  No pedal edema. Gastrointestinal system: Abdomen is nondistended, soft and nontender. Normal bowel sounds heard. Central nervous system: Alert and oriented. No focal neurological deficits. Extremities: Symmetric 5 x 5 power. Skin: No rashes, lesions or ulcers Psychiatry: Judgement and insight appear normal. Mood & affect appropriate.   Data Reviewed: Results for orders placed or performed during the hospital encounter of 03/24/21 (from the past 24 hour(s))  Resp Panel by RT-PCR (Flu A&B, Covid) Nasopharyngeal Swab     Status: None   Collection Time: 03/24/21  9:23 AM   Specimen: Nasopharyngeal Swab; Nasopharyngeal(NP) swabs in vial transport medium  Result Value Ref Range   SARS Coronavirus 2 by RT PCR NEGATIVE NEGATIVE   Influenza A by PCR NEGATIVE NEGATIVE    Influenza B by PCR NEGATIVE NEGATIVE  CBG monitoring, ED     Status: Abnormal   Collection Time: 03/24/21  9:35 AM  Result Value Ref Range   Glucose-Capillary 130 (H) 70 - 99 mg/dL  Comprehensive metabolic panel     Status: Abnormal   Collection Time: 03/24/21 10:00 AM  Result Value Ref Range   Sodium 139 135 - 145 mmol/L   Potassium 3.5 3.5 - 5.1 mmol/L   Chloride 107 98 - 111 mmol/L   CO2 26 22 - 32 mmol/L   Glucose, Bld 128 (H) 70 - 99 mg/dL   BUN 11 6 - 20 mg/dL   Creatinine, Ser 7.59 0.61 - 1.24 mg/dL   Calcium 8.9 8.9 - 16.3 mg/dL   Total Protein 7.8 6.5 - 8.1 g/dL   Albumin 4.2 3.5 - 5.0 g/dL   AST 27 15 - 41 U/L   ALT 22 0 - 44 U/L   Alkaline Phosphatase 80 38 - 126 U/L   Total Bilirubin 0.1 (L) 0.3 - 1.2 mg/dL   GFR, Estimated >84 >66 mL/min   Anion gap 6 5 - 15  CBC with Differential/Platelet     Status: Abnormal   Collection Time: 03/24/21 10:00 AM  Result Value Ref Range   WBC 10.4 4.0 - 10.5 K/uL   RBC 4.81 4.22 - 5.81 MIL/uL   Hemoglobin 14.2 13.0 - 17.0 g/dL   HCT 59.9 35.7 - 01.7 %   MCV 90.4 80.0 - 100.0 fL   MCH 29.5  26.0 - 34.0 pg   MCHC 32.6 30.0 - 36.0 g/dL   RDW 37.6 28.3 - 15.1 %   Platelets 310 150 - 400 K/uL   nRBC 0.0 0.0 - 0.2 %   Neutrophils Relative % 46 %   Neutro Abs 4.9 1.7 - 7.7 K/uL   Lymphocytes Relative 41 %   Lymphs Abs 4.3 (H) 0.7 - 4.0 K/uL   Monocytes Relative 9 %   Monocytes Absolute 0.9 0.1 - 1.0 K/uL   Eosinophils Relative 3 %   Eosinophils Absolute 0.3 0.0 - 0.5 K/uL   Basophils Relative 1 %   Basophils Absolute 0.1 0.0 - 0.1 K/uL   Immature Granulocytes 0 %   Abs Immature Granulocytes 0.03 0.00 - 0.07 K/uL  Magnesium     Status: None   Collection Time: 03/24/21 10:00 AM  Result Value Ref Range   Magnesium 2.0 1.7 - 2.4 mg/dL  Urinalysis, Routine w reflex microscopic Urine, Clean Catch     Status: None   Collection Time: 03/24/21  1:45 PM  Result Value Ref Range   Color, Urine YELLOW YELLOW   APPearance CLEAR CLEAR    Specific Gravity, Urine 1.018 1.005 - 1.030   pH 6.0 5.0 - 8.0   Glucose, UA NEGATIVE NEGATIVE mg/dL   Hgb urine dipstick NEGATIVE NEGATIVE   Bilirubin Urine NEGATIVE NEGATIVE   Ketones, ur NEGATIVE NEGATIVE mg/dL   Protein, ur NEGATIVE NEGATIVE mg/dL   Nitrite NEGATIVE NEGATIVE   Leukocytes,Ua NEGATIVE NEGATIVE  Urine rapid drug screen (hosp performed)     Status: Abnormal   Collection Time: 03/24/21  1:45 PM  Result Value Ref Range   Opiates NONE DETECTED NONE DETECTED   Cocaine NONE DETECTED NONE DETECTED   Benzodiazepines POSITIVE (A) NONE DETECTED   Amphetamines NONE DETECTED NONE DETECTED   Tetrahydrocannabinol POSITIVE (A) NONE DETECTED   Barbiturates NONE DETECTED NONE DETECTED     Family Communication: none at bedside.   Disposition: Status is: Inpatient Remains inpatient appropriate because: further work up for recurrent seizures.           Planned Discharge Destination: Home   Time spent: 38  minutes  Author: Kathlen Mody, MD 03/25/2021 9:00 AM  For on call review [email or web address].

## 2021-03-25 NOTE — Progress Notes (Signed)
°  Transition of Care [facility]) Screening Note   Patient Details  Name: Adam [name] Date of Birth: [birth year]   Transition of Care [facility]) CM/SW Contact:    Kermit Balo, RN Phone Number: 03/25/2021, 3:23 PM    Transition of Care Department [facility]) has reviewed patient. We will continue to monitor patient advancement through interdisciplinary progression rounds. If new patient transition needs arise, please place a TOC consult.

## 2021-03-25 NOTE — Progress Notes (Signed)
LTM EEG discontinued - no skin breakdown at unhook.   

## 2021-03-25 NOTE — Progress Notes (Signed)
Maint. Performed. No break down under  FP2 A2 F4

## 2021-03-25 NOTE — Procedures (Signed)
EEG Procedure CPT/Type of Study: 95720; 24hr EEG with video Referring Provider: Blake Divine Primary Neurological Diagnosis: status epilepticus  History: This is a 41 yr old patient, undergoing an EEG to evaluate for status epilepticus. Clinical State: obtunded  Technical Description:  The EEG was performed using standard setting per the guidelines of American Clinical Neurophysiology Society (ACNS).  A minimum of 21 electrodes were placed on scalp according to the International 10-20 or/and 10-10 Systems. Supplemental electrodes were placed as needed. Single EKG electrode was also used to detect cardiac arrhythmia. Patient's behavior was continuously recorded on video simultaneously with EEG. A minimum of 16 channels were used for data display. Each epoch of study was reviewed manually daily and as needed using standard referential and bipolar montages. Computerized quantitative EEG analysis (such as compressed spectral array analysis, trending, automated spike & seizure detection) were used as indicated.   Day 1: from 1318 03/24/21 to 0730 03/25/21  EEG Description: Overall Amplitude:Normal Predominant Frequency: The background activity showed theta, with about 4-6 Hz, that was frequent. Superimposed Frequencies: some beta activity bilaterally The background was symmetric  Background Abnormalities: Focal slowing: left temporal focal delta as below Rhythmic or periodic pattern: Lateralized rhythmic delta activity: left temporal LRDA in runs Epileptiform activity: no Electrographic seizures: no Events: no   Breach rhythm: no  Reactivity: Present  Stimulation procedures:  Hyperventilation: not done Photic stimulation: not done  Sleep Background: Stage II  EKG:no significant arrhythmia  Impression: This was an abnormal continuous video EEG due to left temporal focal slowing with LRDA, indicative of a focal cerebral lesion on that side. No definite seizures were seen.

## 2021-03-25 NOTE — ED Notes (Signed)
RN sent secure chat to Dr Karleen Hampshire re: diet order, awaiting response.

## 2021-03-25 NOTE — Plan of Care (Signed)
EEG has been hooked to pt. Pt has had breakfast. Two IV's in place in the RA and LA. No PICC or midline. No skin issues. Pt has been educated and oriented to room. Call bell in place.   Problem: Education: Goal: Knowledge of General Education information will improve Description: Including pain rating scale, medication(s)/side effects and non-pharmacologic comfort measures Outcome: Progressing   Problem: Health Behavior/Discharge Planning: Goal: Ability to manage health-related needs will improve Outcome: Progressing   Problem: Clinical Measurements: Goal: Ability to maintain clinical measurements within normal limits will improve Outcome: Progressing Goal: Will remain free from infection Outcome: Progressing Goal: Diagnostic test results will improve Outcome: Progressing Goal: Respiratory complications will improve Outcome: Progressing Goal: Cardiovascular complication will be avoided Outcome: Progressing   Problem: Activity: Goal: Risk for activity intolerance will decrease Outcome: Progressing   Problem: Coping: Goal: Level of anxiety will decrease Outcome: Progressing   Problem: Elimination: Goal: Will not experience complications related to bowel motility Outcome: Progressing Goal: Will not experience complications related to urinary retention Outcome: Progressing   Problem: Safety: Goal: Ability to remain free from injury will improve Outcome: Progressing   Problem: Skin Integrity: Goal: Risk for impaired skin integrity will decrease Outcome: Progressing

## 2021-03-25 NOTE — Progress Notes (Addendum)
Subjective: Interval History: Doing well overnight.  No seizures reported.   Objective: Vital signs in last 24 hours: Temp:  [97.8 F (36.6 C)-98.4 F (36.9 C)] 98.4 F (36.9 C) (02/13 1549) Pulse Rate:  [62-99] 67 (02/13 1549) Resp:  [12-28] 18 (02/13 1549) BP: (107-132)/(58-97) 118/78 (02/13 1549) SpO2:  [91 %-100 %] 99 % (02/13 1549) Weight:  [65.8 kg] 65.8 kg (02/13 1021)  Intake/Output from previous day: 02/12 0701 - 02/13 0700 In: 1293.7 [IV Piggyback:1293.7] Out: -  Intake/Output this shift: Total I/O In: -  Out: 225 [Urine:225] Nutritional status:  Diet Order             Diet regular Room service appropriate? Yes; Fluid consistency: Thin  Diet effective now                   General appears to be no acute distress. Neuro: Alert and awake oriented x 3. Follows all commands.  Cranial 2 through 12: Pupils equal and reactive extraocular motions are intact. Motor: Nonfocal Gait: Deferred  Lab Results: Recent Labs    03/24/21 1000  WBC 10.4  HGB 14.2  HCT 43.5  PLT 310  NA 139  K 3.5  CL 107  CO2 26  GLUCOSE 128*  BUN 11  CREATININE 0.86  CALCIUM 8.9   Lipid Panel No results for input(s): CHOL, TRIG, HDL, CHOLHDL, VLDL, LDLCALC in the last 72 hours.  Studies/Results: DG Chest Portable 1 View  Result Date: 03/24/2021 CLINICAL DATA:  Shortness of breath. EXAM: PORTABLE CHEST 1 VIEW COMPARISON:  11/17/2018 FINDINGS: The lungs are clear without focal pneumonia, edema, pneumothorax or pleural effusion. The cardiopericardial silhouette is within normal limits for size. Prominent gastric bubble. Battery pack for left-sided stimulator device noted. The visualized bony structures of the thorax show no acute abnormality. Telemetry leads overlie the chest. IMPRESSION: No active disease. Electronically Signed   By: Misty Stanley M.D.   On: 03/24/2021 10:56   Overnight EEG with video  Result Date: 03/25/2021 [name], MD     03/25/2021  8:30 AM EEG  Procedure CPT/Type of Study: 92330; 24hr EEG with video Referring Provider: Karleen Hampshire Primary Neurological Diagnosis: status epilepticus History: This is a 41 yr old patient, undergoing an EEG to evaluate for status epilepticus. Clinical State: obtunded Technical Description: The EEG was performed using standard setting per the guidelines of American Clinical Neurophysiology Society (ACNS). A minimum of 21 electrodes were placed on scalp according to the International 10-20 or/and 10-10 Systems. Supplemental electrodes were placed as needed. Single EKG electrode was also used to detect cardiac arrhythmia. Patient's behavior was continuously recorded on video simultaneously with EEG. A minimum of 16 channels were used for data display. Each epoch of study was reviewed manually daily and as needed using standard referential and bipolar montages. Computerized quantitative EEG analysis (such as compressed spectral array analysis, trending, automated spike & seizure detection) were used as indicated. Day 1: from 1318 03/24/21 to 0730 03/25/21 EEG Description: Overall Amplitude:Normal Predominant Frequency: The background activity showed theta, with about 4-6 Hz, that was frequent. Superimposed Frequencies: some beta activity bilaterally The background was symmetric Background Abnormalities: Focal slowing: left temporal focal delta as below Rhythmic or periodic pattern: Lateralized rhythmic delta activity: left temporal LRDA in runs Epileptiform activity: no Electrographic seizures: no Events: no Breach rhythm: no Reactivity: Present Stimulation procedures: Hyperventilation: not done Photic stimulation: not done Sleep Background: Stage II EKG:no significant arrhythmia Impression: This was an abnormal continuous video EEG due  to left temporal focal slowing with LRDA, indicative of a focal cerebral lesion on that side. No definite seizures were seen.    Medications: Scheduled:  chlorhexidine gluconate (MEDLINE KIT)  15 mL  Mouth Rinse BID   cloBAZam  10 mg Oral BID   enoxaparin (LOVENOX) injection  40 mg Subcutaneous Q24H   mouth rinse  15 mL Mouth Rinse 10 times per day   pyridoxine  100 mg Oral Daily   sertraline  50 mg Oral Daily   zonisamide  200 mg Oral Daily   Continuous:  sodium chloride 75 mL/hr (03/25/21 1436)   lacosamide (VIMPAT) IV 300 mg (03/25/21 1134)   levETIRAcetam 2,500 mg (03/25/21 1130)   MHW:[id number] **OR** acetaminophen, clonazePAM, LORazepam, LORazepam, ondansetron **OR** ondansetron (ZOFRAN) IV  Assessment/Plan: 41 year old gentleman history of seizures presenting with status epilepticus.  Overnight he had no seizures on LTM.  He continues Keppra and Vimpat 338m BID and clobazam 185mBID. Should be on all PO meds now.  2/12-2/13 LTM: Impression: This was an abnormal continuous video EEG due to left temporal focal slowing with LRDA, indicative of a focal cerebral lesion on that side. No definite seizures were seen.  Plan: DC LTM Neurology will sign off please call with questions   LOS: 1 day   [place] Total of 35 mins spent reviewing chart, discussion with patient and family on prognosis, Dx and plan. Discussed case with patient's nurse. Reviewed Imaging personally.

## 2021-03-25 NOTE — ED Notes (Signed)
Called 3w to get purple man activated, Person who answered phone stated they needed to talk to [facility] because they didn't think they were accepting Pt.

## 2021-03-26 DIAGNOSIS — G40901 Epilepsy, unspecified, not intractable, with status epilepticus: Principal | ICD-10-CM

## 2021-03-26 LAB — LEVETIRACETAM LEVEL: Levetiracetam Lvl: 16.6 ug/mL (ref 10.0–40.0)

## 2021-03-26 MED ORDER — SODIUM CHLORIDE 0.9 % IV SOLN
2500.0000 mg | Freq: Two times a day (BID) | INTRAVENOUS | Status: AC
  Administered 2021-03-26: 2500 mg via INTRAVENOUS

## 2021-03-26 MED ORDER — LACOSAMIDE 100 MG PO TABS: 300.0000 mg | ORAL_TABLET | Freq: Two times a day (BID) | ORAL | 0 refills | Status: AC

## 2021-03-26 MED ORDER — LACOSAMIDE 50 MG PO TABS
300.0000 mg | ORAL_TABLET | Freq: Two times a day (BID) | ORAL | Status: DC
  Administered 2021-03-26: 300 mg via ORAL
  Filled 2021-03-26: qty 2

## 2021-03-26 MED ORDER — LEVETIRACETAM 750 MG PO TABS: 2500.0000 mg | ORAL_TABLET | Freq: Two times a day (BID) | ORAL | Status: DC

## 2021-03-26 NOTE — TOC Transition Note (Addendum)
Transition of Care (TOC) - CM/SW Discharge Note ° ° °Patient Details  °Name: Adam [name] °MRN: [id number] °Date of Birth: 04/17/1980 ° °Transition of Care (TOC) CM/SW Contact:  ° F , RN °Phone Number: °03/26/2021, 10:29 AM ° ° °Clinical Narrative:    °Patient is discharging home with self care.  °CM met with the patient about a PCP. He prefers to stay seeing his neurologist for his primary care. No further needs per TOC. ° ° °Final next level of care: Home/Self Care °Barriers to Discharge: No Barriers Identified ° ° °Patient Goals and CMS Choice °  °  °  ° °Discharge Placement °  °           °  °  °  °  ° °Discharge Plan and Services °  °  °           °  °  °  °  °  °  °  °  °  °  ° °Social Determinants of Health (SDOH) Interventions °  ° ° °Readmission Risk Interventions °No flowsheet data found. ° ° ° ° °

## 2021-03-26 NOTE — Plan of Care (Signed)
  Problem: Education: Goal: Knowledge of General Education information will improve Description: Including pain rating scale, medication(s)/side effects and non-pharmacologic comfort measures Outcome: Adequate for Discharge   Problem: Health Behavior/Discharge Planning: Goal: Ability to manage health-related needs will improve Outcome: Adequate for Discharge   Problem: Clinical Measurements: Goal: Ability to maintain clinical measurements within normal limits will improve Outcome: Adequate for Discharge Goal: Will remain free from infection Outcome: Adequate for Discharge Goal: Diagnostic test results will improve Outcome: Adequate for Discharge Goal: Respiratory complications will improve Outcome: Adequate for Discharge Goal: Cardiovascular complication will be avoided Outcome: Adequate for Discharge   Problem: Activity: Goal: Risk for activity intolerance will decrease Outcome: Adequate for Discharge   Problem: Coping: Goal: Level of anxiety will decrease Outcome: Adequate for Discharge   Problem: Elimination: Goal: Will not experience complications related to bowel motility Outcome: Adequate for Discharge Goal: Will not experience complications related to urinary retention Outcome: Adequate for Discharge   Problem: Safety: Goal: Ability to remain free from injury will improve Outcome: Adequate for Discharge   Problem: Skin Integrity: Goal: Risk for impaired skin integrity will decrease Outcome: Adequate for Discharge   

## 2021-03-29 LAB — LACOSAMIDE: Lacosamide: 6.4 ug/mL (ref 5.0–10.0)

## 2021-03-31 NOTE — Discharge Summary (Signed)
Physician Discharge Summary   Patient: Adam [name] MRN: [id number] DOB: 12/25/1980  Admit date:     03/24/2021  Discharge date: 03/26/2021  Discharge Physician: Hosie Poisson   PCP: Patient, No Pcp Per (Inactive)   Recommendations at discharge:  Please follow up with PCP in one week Please follow up with cbc and BMP in one week.  Please follow up with neurology as recommended.   Discharge Diagnoses: Principal Problem:   Seizure [name]) Active Problems:   Breakthrough seizure [name])     Hospital Course: Adam [name] is a 41 y.o. 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  Assessment and Plan: Status epilepticus:  As per family pt is compliant to anti epileptic meds both keppra and vimpat.  Pt also continues to use marijuana, which can potentially lower seizure threshold.  Neurology on board and pt to be on 24 hour EEG / video surveillance.  LTM showed  This was an abnormal continuous video EEG due to left temporal focal slowing with LRDA, indicative of a focal cerebral lesion on that side. No definite seizures were seen. Neurology signed off. Recommended Keppra and Vimpat 300mg  BID and clobazam 10mg  BID on discharge.        Consultants: neurology  Procedures performed: LTM  Disposition: Home Diet recommendation:  Discharge Diet Orders (From admission, onward)     Start     Ordered   03/26/21 0000  Diet - low sodium heart healthy        03/26/21 0848           Regular diet  DISCHARGE MEDICATION: Allergies as of 03/26/2021   No Known Allergies      Medication List     STOP taking these medications    cefTRIAXone  IVPB Commonly known as: ROCEPHIN   vancomycin  IVPB       TAKE these medications    cloBAZam 20 MG tablet Commonly known as: ONFI Take 10 mg by mouth 2 (two) times daily.   clonazePAM 0.5 MG  disintegrating tablet Commonly known as: KLONOPIN Take 0.5 mg by mouth daily as needed for seizure.   Lacosamide 100 MG Tabs Commonly known as: Vimpat Take 3 tablets (300 mg total) by mouth 2 (two) times daily.   levETIRAcetam 1000 MG tablet Commonly known as: KEPPRA Take 2,500 mg by mouth 2 (two) times daily.   LORazepam 2 MG/ML injection Commonly known as: ATIVAN Inject 1 mL (2 mg total) into the muscle once as needed for up to 1 dose for seizure.   pyridoxine 100 MG tablet Commonly known as: B-6 Take 100 mg by mouth daily.   sertraline 50 MG tablet Commonly known as: ZOLOFT Take 50 mg by mouth daily.   zonisamide 100 MG capsule Commonly known as: ZONEGRAN Take 200 mg by mouth daily.         Discharge Exam: Filed Weights   03/25/21 1021  Weight: 65.8 kg   General exam: Appears calm and comfortable  Respiratory system: Clear to auscultation. Respiratory effort normal. Cardiovascular system: S1 & S2 heard, RRR. No JVD, murmurs, rubs, gallops or clicks. No pedal edema. Gastrointestinal system: Abdomen is nondistended, soft and nontender. No organomegaly or masses felt. Normal bowel sounds heard. Central nervous system: Alert and oriented. No focal neurological deficits. Extremities: Symmetric 5 x 5 power. Skin: No rashes, lesions or ulcers Psychiatry: Judgement and insight appear normal. Mood &  affect appropriate.    Condition at discharge: good  The results of significant diagnostics from this hospitalization (including imaging, microbiology, ancillary and laboratory) are listed below for reference.   Imaging Studies: DG Chest Portable 1 View  Result Date: 03/24/2021 CLINICAL DATA:  Shortness of breath. EXAM: PORTABLE CHEST 1 VIEW COMPARISON:  11/17/2018 FINDINGS: The lungs are clear without focal pneumonia, edema, pneumothorax or pleural effusion. The cardiopericardial silhouette is within normal limits for size. Prominent gastric bubble. Battery pack for  left-sided stimulator device noted. The visualized bony structures of the thorax show no acute abnormality. Telemetry leads overlie the chest. IMPRESSION: No active disease. Electronically Signed   By: Misty Stanley M.D.   On: 03/24/2021 10:56   Overnight EEG with video  Result Date: 03/25/2021 [name], MD     03/25/2021  8:30 AM EEG Procedure CPT/Type of Study: [id number]; 24hr EEG with video Referring Provider: Karleen Hampshire Primary Neurological Diagnosis: status epilepticus History: This is a 41 yr old patient, undergoing an EEG to evaluate for status epilepticus. Clinical State: obtunded Technical Description: The EEG was performed using standard setting per the guidelines of American Clinical Neurophysiology Society (ACNS). A minimum of 21 electrodes were placed on scalp according to the International 10-20 or/and 10-10 Systems. Supplemental electrodes were placed as needed. Single EKG electrode was also used to detect cardiac arrhythmia. Patient's behavior was continuously recorded on video simultaneously with EEG. A minimum of 16 channels were used for data display. Each epoch of study was reviewed manually daily and as needed using standard referential and bipolar montages. Computerized quantitative EEG analysis (such as compressed spectral array analysis, trending, automated spike & seizure detection) were used as indicated. Day 1: from 1318 03/24/21 to 0730 03/25/21 EEG Description: Overall Amplitude:Normal Predominant Frequency: The background activity showed theta, with about 4-6 Hz, that was frequent. Superimposed Frequencies: some beta activity bilaterally The background was symmetric Background Abnormalities: Focal slowing: left temporal focal delta as below Rhythmic or periodic pattern: Lateralized rhythmic delta activity: left temporal LRDA in runs Epileptiform activity: no Electrographic seizures: no Events: no Breach rhythm: no Reactivity: Present Stimulation procedures: Hyperventilation: not done Photic  stimulation: not done Sleep Background: Stage II EKG:no significant arrhythmia Impression: This was an abnormal continuous video EEG due to left temporal focal slowing with LRDA, indicative of a focal cerebral lesion on that side. No definite seizures were seen.    Microbiology: Results for orders placed or performed during the hospital encounter of 03/24/21  Resp Panel by RT-PCR (Flu A&B, Covid) Nasopharyngeal Swab     Status: None   Collection Time: 03/24/21  9:23 AM   Specimen: Nasopharyngeal Swab; Nasopharyngeal(NP) swabs in vial transport medium  Result Value Ref Range Status   SARS Coronavirus 2 by RT PCR NEGATIVE NEGATIVE Final    Comment: (NOTE) SARS-CoV-2 target nucleic acids are NOT DETECTED.  The SARS-CoV-2 RNA is generally detectable in upper respiratory specimens during the acute phase of infection. The lowest concentration of SARS-CoV-2 viral copies this assay can detect is 138 copies/mL. A negative result does not preclude SARS-Cov-2 infection and should not be used as the sole basis for treatment or other patient management decisions. A negative result may occur with  improper specimen collection/handling, submission of specimen other than nasopharyngeal swab, presence of viral mutation(s) within the areas targeted by this assay, and inadequate number of viral copies(<138 copies/mL). A negative result must be combined with clinical observations, patient history, and epidemiological information. The expected result is Negative.  Fact  Sheet for Patients:  EntrepreneurPulse.com.au  Fact Sheet for Healthcare Providers:  IncredibleEmployment.be  This test is no t yet approved or cleared by the [place] FDA and  has been authorized for detection and/or diagnosis of SARS-CoV-2 by FDA under an Emergency Use Authorization (EUA). This EUA will remain  in effect (meaning this test can be used) for the duration of the COVID-19 declaration  under Section 564(b)(1) of the Act, 21 U.S.C.section 360bbb-3(b)(1), unless the authorization is terminated  or revoked sooner.       Influenza A by PCR NEGATIVE NEGATIVE Final   Influenza B by PCR NEGATIVE NEGATIVE Final    Comment: (NOTE) The Xpert Xpress SARS-CoV-2/FLU/RSV plus assay is intended as an aid in the diagnosis of influenza from Nasopharyngeal swab specimens and should not be used as a sole basis for treatment. Nasal washings and aspirates are unacceptable for Xpert Xpress SARS-CoV-2/FLU/RSV testing.  Fact Sheet for Patients: EntrepreneurPulse.com.au  Fact Sheet for Healthcare Providers: IncredibleEmployment.be  This test is not yet approved or cleared by the [place] FDA and has been authorized for detection and/or diagnosis of SARS-CoV-2 by FDA under an Emergency Use Authorization (EUA). This EUA will remain in effect (meaning this test can be used) for the duration of the COVID-19 declaration under Section 564(b)(1) of the Act, 21 U.S.C. section 360bbb-3(b)(1), unless the authorization is terminated or revoked.  Performed at [facility], [place] [address] [place], [place] [address]     Labs: CBC: No results for input(s): WBC, NEUTROABS, HGB, HCT, MCV, PLT in the last 168 hours. Basic Metabolic Panel: No results for input(s): NA, K, CL, CO2, GLUCOSE, BUN, CREATININE, CALCIUM, MG, PHOS in the last 168 hours. Liver Function Tests: No results for input(s): AST, ALT, ALKPHOS, BILITOT, PROT, ALBUMIN in the last 168 hours. CBG: No results for input(s): GLUCAP in the last 168 hours.  Discharge time spent: greater than 30 minutes.  Signed: Hosie Poisson, MD [facility] 03/31/2021

## 2021-06-17 ENCOUNTER — Emergency Department (HOSPITAL_BASED_OUTPATIENT_CLINIC_OR_DEPARTMENT_OTHER)
Admission: EM | Admit: 2021-06-17 | Discharge: 2021-06-17 | Disposition: A | Discharge status: Home / Self Care | Payer: Medicare HMO | Attending: Emergency Medicine | Admitting: Emergency Medicine

## 2021-06-17 ENCOUNTER — Encounter (HOSPITAL_BASED_OUTPATIENT_CLINIC_OR_DEPARTMENT_OTHER): Payer: Self-pay | Admitting: Obstetrics and Gynecology

## 2021-06-17 ENCOUNTER — Other Ambulatory Visit: Payer: Self-pay

## 2021-06-17 DIAGNOSIS — Z79899 Other long term (current) drug therapy: Secondary | ICD-10-CM | POA: Insufficient documentation

## 2021-06-17 DIAGNOSIS — R21 Rash and other nonspecific skin eruption: Secondary | ICD-10-CM | POA: Insufficient documentation

## 2021-06-17 MED ORDER — TRIAMCINOLONE ACETONIDE 0.1 % EX LOTN: 1.0000 "application " | TOPICAL_LOTION | Freq: Three times a day (TID) | CUTANEOUS | 0 refills | Status: AC | PRN

## 2021-06-17 NOTE — ED Notes (Signed)
RN provided AVS using Teachback Method. Patient verbalizes understanding of Discharge Instructions. Opportunity for Questioning and Answers were provided by RN. Patient Discharged from ED ambulatory to Home. ° °

## 2021-06-17 NOTE — ED Triage Notes (Signed)
Patient presents to the ER for rash on his torso and upper body. Patient endorses itching. Patient reports it started with open lesions that they believe are ringworm on the back. They put apple cider vinegar on them.  ?Patient reports a hx of meningitis, stroke, and seizures and reports he was sent here to make sure there was no domino effect that would cause sepsis or something else.  ?

## 2021-06-17 NOTE — ED Notes (Signed)
PA at the Bedside. ?

## 2021-06-17 NOTE — ED Provider Notes (Signed)
Adam [name]   CSN: [id number] Arrival date & time: 06/17/21  1503     History  Chief Complaint  Patient presents with   Rash    Adam [name] is a 41 y.o. male.  The history is provided by the patient and the spouse. No language interpreter was used.  Rash Location:  Torso Quality: dryness, itchiness, redness and scaling   Severity:  Moderate Onset quality:  Sudden Duration:  24 hours Timing:  Intermittent Progression:  Spreading Chronicity:  New Context: not animal contact, not chemical exposure, not diapers, not eggs, not exposure to similar rash, not food, not hot tub use, not insect bite/sting, not medications, not new detergent/soap, not nuts, not plant contact, not pollen, not pregnancy, not sick contacts and not sun exposure   Relieved by:  Nothing Worsened by:  Nothing Ineffective treatments: apple cider vinegar applied topically. Associated symptoms: no abdominal pain, no diarrhea, no fatigue, no fever, no headaches, no hoarse voice, no induration, no joint pain, no myalgias, no nausea, no periorbital edema, no shortness of breath, no sore throat, no throat swelling, no tongue swelling, no URI, not vomiting and not wheezing            Home Medications Prior to Admission medications   Medication Sig Start Date End Date Taking? Authorizing Provider  triamcinolone lotion (KENALOG) 0.1 % Apply 1 application. topically 3 (three) times daily as needed (itching.). Do not apply to face. 06/17/21  Yes Sae Handrich, PA-C  cloBAZam (ONFI) 20 MG tablet Take 10 mg by mouth 2 (two) times daily. 11/02/20   [provider]  clonazePAM (KLONOPIN) 0.5 MG disintegrating tablet Take 0.5 mg by mouth daily as needed for seizure. 09/20/20   [provider]  Lacosamide (VIMPAT) 100 MG TABS Take 3 tablets (300 mg total) by mouth 2 (two) times daily. 03/26/21 04/25/21  Hosie Poisson, MD  levETIRAcetam (KEPPRA) 1000 MG tablet Take  2,500 mg by mouth 2 (two) times daily. 10/27/18   [provider]  LORazepam (ATIVAN) 2 MG/ML injection Inject 1 mL (2 mg total) into the muscle once as needed for up to 1 dose for seizure. 09/18/20   Arnaldo Natal, MD  pyridoxine (B-6) 100 MG tablet Take 100 mg by mouth daily.    [provider]  sertraline (ZOLOFT) 50 MG tablet Take 50 mg by mouth daily. 11/15/20   [provider]  zonisamide (ZONEGRAN) 100 MG capsule Take 200 mg by mouth daily. 03/06/21   [provider]      Allergies    Patient has no known allergies.    Review of Systems   Review of Systems  Constitutional:  Negative for fatigue and fever.  HENT:  Negative for hoarse voice and sore throat.   Respiratory:  Negative for shortness of breath and wheezing.   Gastrointestinal:  Negative for abdominal pain, diarrhea, nausea and vomiting.  Musculoskeletal:  Negative for arthralgias and myalgias.  Skin:  Positive for rash.  Neurological:  Negative for headaches.   Physical Exam Updated Vital Signs BP (!) 125/91 (BP Location: Right Arm)   Pulse 77   Temp 98.2 F (36.8 C) (Oral)   Resp 15   SpO2 100%  Physical Exam Vitals and nursing [name] reviewed.  Constitutional:      General: He is not in acute distress.    Appearance: He is well-developed. He is not diaphoretic.  HENT:     Head: Normocephalic and atraumatic.  Eyes:  General: No scleral icterus.    Conjunctiva/sclera: Conjunctivae normal.  Cardiovascular:     Rate and Rhythm: Normal rate and regular rhythm.     Heart sounds: Normal heart sounds.  Pulmonary:     Effort: Pulmonary effort is normal. No respiratory distress.     Breath sounds: Normal breath sounds.  Abdominal:     Palpations: Abdomen is soft.     Tenderness: There is no abdominal tenderness.  Musculoskeletal:     Cervical back: Normal range of motion and neck supple.  Skin:    General: Skin is warm and dry.     Comments: Diffuse maculopapular rash with  erythema, and fine scale, areas of scabbing.on the torso, and arms.   Neurological:     Mental Status: He is alert.  Psychiatric:        Behavior: Behavior normal.    ED Results / Procedures / Treatments   Labs (all labs ordered are listed, but only abnormal results are displayed) Labs Reviewed - No data to display  EKG None  Radiology No results found.  Procedures Procedures    Medications Ordered in ED Medications - No data to display  ED Course/ Medical Decision Making/ A&P                           Medical Decision Making  Patient with nonspecific eruption. May represent pityriasis. No signs of infection. No mucosal surface involvement. Discharge with symptomatic treatment. Follow up with PCP in 2-3 days.        Final Clinical Impression(s) / ED Diagnoses Final diagnoses:  Rash and nonspecific skin eruption    Rx / DC Orders ED Discharge Orders          Ordered    triamcinolone lotion (KENALOG) 0.1 %  3 times daily PRN        06/17/21 1541              Margarita Mail, PA-C 06/17/21 1542    Luna Fuse, MD 06/28/21 2228

## 2021-06-17 NOTE — Discharge Instructions (Signed)
Contact a health care provider if:  You sweat at night.  You lose weight.  You urinate more than normal.  You urinate less than normal, or you notice that your urine is a darker color than usual.  You feel weak.  You vomit.  Your skin or the whites of your eyes look yellow (jaundice).  Your skin:  Tingles.  Is numb.  Your rash:  Does not go away after several days.  Gets worse.  You are:  Unusually thirsty.  More tired than normal.  You have:  New symptoms.  Pain in your abdomen.  A fever.  Diarrhea.  Get help right away if you:  Have a fever and your symptoms suddenly get worse.  Develop confusion.  Have a severe headache or a stiff neck.  Have severe joint pains or stiffness.  Have a seizure.  Develop a rash that covers all or most of your body. The rash may or may not be painful.  Develop blisters that:  Are on top of the rash.  Grow larger or grow together.  Are painful.  Are inside your nose or mouth.  Develop a rash that:  Looks like purple pinprick-sized spots all over your body.  Has a "bull's eye" or looks like a target.  Is not related to sun exposure, is red and painful, and causes your skin to peel.

## 2022-07-15 ENCOUNTER — Other Ambulatory Visit: Payer: Self-pay

## 2022-07-15 ENCOUNTER — Emergency Department (HOSPITAL_COMMUNITY)
Admission: EM | Admit: 2022-07-15 | Discharge: 2022-07-16 | Disposition: A | Discharge status: Home / Self Care | Payer: Medicare Other | Attending: Emergency Medicine | Admitting: Emergency Medicine

## 2022-07-15 DIAGNOSIS — R569 Unspecified convulsions: Secondary | ICD-10-CM | POA: Diagnosis present

## 2022-07-15 DIAGNOSIS — G40901 Epilepsy, unspecified, not intractable, with status epilepticus: Secondary | ICD-10-CM | POA: Diagnosis not present

## 2022-07-15 DIAGNOSIS — G40919 Epilepsy, unspecified, intractable, without status epilepticus: Secondary | ICD-10-CM

## 2022-07-15 LAB — CBC WITH DIFFERENTIAL/PLATELET
Abs Immature Granulocytes: 0.02 10*3/uL (ref 0.00–0.07)
Basophils Absolute: 0.1 10*3/uL (ref 0.0–0.1)
Basophils Relative: 1 %
Eosinophils Absolute: 0.1 10*3/uL (ref 0.0–0.5)
Eosinophils Relative: 2 %
HCT: 47.3 % (ref 39.0–52.0)
Hemoglobin: 15.4 g/dL (ref 13.0–17.0)
Immature Granulocytes: 0 %
Lymphocytes Relative: 28 %
Lymphs Abs: 1.7 10*3/uL (ref 0.7–4.0)
MCH: 30.3 pg (ref 26.0–34.0)
MCHC: 32.6 g/dL (ref 30.0–36.0)
MCV: 92.9 fL (ref 80.0–100.0)
Monocytes Absolute: 0.5 10*3/uL (ref 0.1–1.0)
Monocytes Relative: 8 %
Neutro Abs: 3.9 10*3/uL (ref 1.7–7.7)
Neutrophils Relative %: 61 %
Platelets: 252 10*3/uL (ref 150–400)
RBC: 5.09 MIL/uL (ref 4.22–5.81)
RDW: 12.7 % (ref 11.5–15.5)
WBC: 6.3 10*3/uL (ref 4.0–10.5)
nRBC: 0 % (ref 0.0–0.2)

## 2022-07-15 LAB — COMPREHENSIVE METABOLIC PANEL
ALT: 16 U/L (ref 0–44)
AST: 21 U/L (ref 15–41)
Albumin: 4.6 g/dL (ref 3.5–5.0)
Alkaline Phosphatase: 70 U/L (ref 38–126)
Anion gap: 13 (ref 5–15)
BUN: 17 mg/dL (ref 6–20)
CO2: 21 mmol/L — ABNORMAL LOW (ref 22–32)
Calcium: 9.1 mg/dL (ref 8.9–10.3)
Chloride: 104 mmol/L (ref 98–111)
Creatinine, Ser: 0.7 mg/dL (ref 0.61–1.24)
GFR, Estimated: >60 mL/min (ref >60)
Glucose, Bld: 84 mg/dL (ref 70–99)
Potassium: 3.5 mmol/L (ref 3.5–5.1)
Sodium: 138 mmol/L (ref 135–145)
Total Bilirubin: 0.4 mg/dL (ref 0.3–1.2)
Total Protein: 8.7 g/dL — ABNORMAL HIGH (ref 6.5–8.1)

## 2022-07-15 LAB — ETHANOL: Alcohol, Ethyl (B): <10 mg/dL (ref <10)

## 2022-07-15 LAB — CBG MONITORING, ED: Glucose-Capillary: 86 mg/dL (ref 70–99)

## 2022-07-15 MED ORDER — LORAZEPAM 2 MG/ML IJ SOLN: 4.0000 mg | INTRAMUSCULAR | Status: DC | PRN

## 2022-07-15 NOTE — ED Triage Notes (Signed)
Pt arrives from home via [facility] c/o seizure. Hx of seizures. Takes daily keppra and lacosamide as prescribed. Wife nayzilam IN when pt was seizing at home. Seizure was grand mal, 2 minutes in length. Typically has more subsequent seizures after one this long. Pt was post ictal for EMS, A&O upon arrival to ED.  EMS vitals: CBG 113 129/80 HR 80, NSR SpO2 95%.

## 2022-07-15 NOTE — Discharge Instructions (Signed)
Thank you for letting us take care of you today.  Your labs today were normal. Please follow up on your Keppra level using MyChart. Follow up with your local neurologist as soon as possible. Continue your medications until you can follow up in clinic and see if they need adjusted. I provided 2 primary care clinics you may contact to establish primary care. I recommend doing this within a week if possible.  For any new or worsening symptoms such as chest pain, shortness of breath, severe headache, vomiting, head injury, recurrent seizures, confusion, or other new, concerning symptoms, return to nearest ED for re-evaluation.
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
## 2022-07-17 LAB — LEVETIRACETAM LEVEL: Levetiracetam Lvl: 29.9 ug/mL (ref 10.0–40.0)

## 2022-11-05 ENCOUNTER — Emergency Department (HOSPITAL_COMMUNITY): Payer: No Typology Code available for payment source

## 2022-11-05 ENCOUNTER — Emergency Department (HOSPITAL_COMMUNITY)
Admission: EM | Admit: 2022-11-05 | Discharge: 2022-11-05 | Disposition: A | Discharge status: Home / Self Care | Payer: No Typology Code available for payment source

## 2022-11-05 ENCOUNTER — Other Ambulatory Visit: Payer: Self-pay

## 2022-11-05 DIAGNOSIS — Y92410 Unspecified street and highway as the place of occurrence of the external cause: Secondary | ICD-10-CM | POA: Diagnosis not present

## 2022-11-05 DIAGNOSIS — S060XAA Concussion with loss of consciousness status unknown, initial encounter: Secondary | ICD-10-CM | POA: Insufficient documentation

## 2022-11-05 DIAGNOSIS — S0990XA Unspecified injury of head, initial encounter: Secondary | ICD-10-CM | POA: Diagnosis present

## 2022-11-05 DIAGNOSIS — R569 Unspecified convulsions: Secondary | ICD-10-CM | POA: Diagnosis not present

## 2022-11-05 LAB — BASIC METABOLIC PANEL
Anion gap: 8 (ref 5–15)
BUN: 12 mg/dL (ref 6–20)
CO2: 26 mmol/L (ref 22–32)
Calcium: 8.7 mg/dL — ABNORMAL LOW (ref 8.9–10.3)
Chloride: 105 mmol/L (ref 98–111)
Creatinine, Ser: 0.88 mg/dL (ref 0.61–1.24)
GFR, Estimated: >60 mL/min (ref >60)
Glucose, Bld: 127 mg/dL — ABNORMAL HIGH (ref 70–99)
Potassium: 3.8 mmol/L (ref 3.5–5.1)
Sodium: 139 mmol/L (ref 135–145)

## 2022-11-05 LAB — CBG MONITORING, ED: Glucose-Capillary: 117 mg/dL — ABNORMAL HIGH (ref 70–99)

## 2022-11-05 MED ORDER — LORAZEPAM 1 MG PO TABS
1.0000 mg | ORAL_TABLET | Freq: Once | ORAL | Status: AC
  Administered 2022-11-05: 1 mg via ORAL
  Filled 2022-11-05: qty 1
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
## 2022-11-05 NOTE — Discharge Instructions (Addendum)
Please continue your seizure medications and call your neurologist tomorrow.  Let them know that the Keppra and lacosamide levels were drawn but were not back tonight.  Return to the emergency department for worsening symptoms.
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
## 2022-11-11 LAB — LACOSAMIDE: Lacosamide: 7.2 ug/mL (ref 5.0–10.0)

## 2022-11-11 LAB — LEVETIRACETAM LEVEL: Levetiracetam Lvl: 23.8 ug/mL (ref 10.0–40.0)

## 2023-03-05 ENCOUNTER — Emergency Department (HOSPITAL_COMMUNITY)
Admission: EM | Admit: 2023-03-05 | Discharge: 2023-03-05 | Disposition: A | Discharge status: Home / Self Care | Payer: Medicare HMO | Attending: Emergency Medicine | Admitting: Emergency Medicine

## 2023-03-05 DIAGNOSIS — R9431 Abnormal electrocardiogram [ECG] [EKG]: Secondary | ICD-10-CM | POA: Diagnosis not present

## 2023-03-05 DIAGNOSIS — G40909 Epilepsy, unspecified, not intractable, without status epilepticus: Secondary | ICD-10-CM | POA: Diagnosis not present

## 2023-03-05 DIAGNOSIS — R569 Unspecified convulsions: Secondary | ICD-10-CM | POA: Insufficient documentation

## 2023-03-05 DIAGNOSIS — G40901 Epilepsy, unspecified, not intractable, with status epilepticus: Secondary | ICD-10-CM | POA: Diagnosis not present

## 2023-03-05 LAB — CBG MONITORING, ED: Glucose-Capillary: 91 mg/dL (ref 70–99)

## 2023-03-05 NOTE — ED Triage Notes (Signed)
Witnessed seizure episode at [place] while at work in the breakroom. Sitting on a chair when seizure episode occurred. Pt was lowered by coworkers on the ground. No falls. Takes seizure meds as prescribed.   EMS VS: CBG 154 100% in RA 126/76 HR 90

## 2023-03-05 NOTE — Discharge Instructions (Signed)
1.  Message or call your neurologist tomorrow to see if they recommend any dose changes to your medications. 2.  No driving, no doing any activities that could result in injury if you have a repeat seizure.  Return to the emergency department if you have repeat seizure or any other concerning symptoms.

## 2023-03-05 NOTE — ED Provider Notes (Signed)
[place] EMERGENCY DEPARTMENT AT [facility] Provider Note   CSN: [id number] Arrival date & time: 03/05/23  1911     History  Chief Complaint  Patient presents with   Seizures    Adam [name] is a 43 y.o. male.  HPI Patient reports he has long history of seizure disorder.  He has diagnosis of epilepsy.  He reports he felt has been compliant with medications.  He had been texting with his wife while he was on break at work.  He suddenly had seizure like activity.  Coworkers were able to sit with her like this he does not have recurrent ones.  I spoke with the patient and his wife on the phone.  They both give this history.  He reports even though he is compliant with medications every once in a while there is a breakthrough seizure.    Home Medications Prior to Admission medications   Medication Sig Start Date End Date Taking? Authorizing Provider  cloBAZam (ONFI) 20 MG tablet Take 10 mg by mouth 2 (two) times daily. 11/02/20   [provider]  clonazePAM (KLONOPIN) 0.5 MG disintegrating tablet Take 0.5 mg by mouth daily as needed for seizure. 09/20/20   [provider]  Lacosamide (VIMPAT) 100 MG TABS Take 3 tablets (300 mg total) by mouth 2 (two) times daily. 03/26/21 04/25/21  Kathlen Mody, MD  levETIRAcetam (KEPPRA) 1000 MG tablet Take 2,500 mg by mouth 2 (two) times daily. 10/27/18   [provider]  LORazepam (ATIVAN) 2 MG/ML injection Inject 1 mL (2 mg total) into the muscle once as needed for up to 1 dose for seizure. 09/18/20   Koleen Distance, MD  pyridoxine (B-6) 100 MG tablet Take 100 mg by mouth daily.    [provider]  sertraline (ZOLOFT) 50 MG tablet Take 50 mg by mouth daily. 11/15/20   [provider]  triamcinolone lotion (KENALOG) 0.1 % Apply 1 application. topically 3 (three) times daily as needed (itching.). Do not apply to face. 06/17/21   Arthor Captain, PA-C  zonisamide (ZONEGRAN) 100 MG capsule Take 200 mg by  mouth daily. 03/06/21   [provider]      Allergies    Patient has no known allergies.    Review of Systems   Review of Systems  Physical Exam Updated Vital Signs BP 111/73 (BP Location: Right Arm)   Pulse 80   Temp 98.2 F (36.8 C) (Oral)   Resp 15   SpO2 98%  Physical Exam Constitutional:      Comments: Alert nontoxic well in appearance.  No distress.  HENT:     Head: Normocephalic and atraumatic.     Mouth/Throat:     Pharynx: Oropharynx is clear.  Eyes:     Extraocular Movements: Extraocular movements intact.  Cardiovascular:     Rate and Rhythm: Normal rate and regular rhythm.  Pulmonary:     Effort: Pulmonary effort is normal.     Breath sounds: Normal breath sounds.  Abdominal:     General: There is no distension.     Palpations: Abdomen is soft.     Tenderness: There is no abdominal tenderness.  Musculoskeletal:        General: No swelling or tenderness. Normal range of motion.     Right lower leg: No edema.     Left lower leg: No edema.  Skin:    General: Skin is warm and dry.  Neurological:     General: No  focal deficit present.     Mental Status: He is oriented to person, place, and time.     Motor: No weakness.     Coordination: Coordination normal.  Psychiatric:        Mood and Affect: Mood normal.     ED Results / Procedures / Treatments   Labs (all labs ordered are listed, but only abnormal results are displayed) Labs Reviewed  CBC  BASIC METABOLIC PANEL  CBG MONITORING, ED    EKG EKG Interpretation Date/Time:  Thursday March 05 2023 19:21:32 EST Ventricular Rate:  69 PR Interval:  130 QRS Duration:  88 QT Interval:  374 QTC Calculation: 400 R Axis:   50  Text Interpretation: Normal sinus rhythm Normal ECG When compared with ECG of 24-Mar-2021 09:31, PREVIOUS ECG IS PRESENT early repolarization seen on previous tracing Confirmed by Arby Barrette [phone]) on 03/05/2023 7:47:49 PM  Radiology No results  found.  Procedures Procedures    Medications Ordered in ED Medications - No data to display  ED Course/ Medical Decision Making/ A&P                                 Medical Decision Making Amount and/or Complexity of Data Reviewed Labs: ordered.   Patient has known history of seizure disorder.  He is compliant with medications.  He takes Vimpat and Keppra.  Patient is clinically well in appearance.  He felt that this is a typical seizure and given the limited amount of associated symptoms and limited time with seizure activity, he has low suspicion that there will be recurrent seizure.  I reviewed this with the patient and his wife.  She advises she will message the neurologist tomorrow to see if they recommend any immediate medication changes.  She advises they have an appointment coming up within the next couple of weeks.  He has all needed medications and has a refill that he is picking up tomorrow.        Final Clinical Impression(s) / ED Diagnoses Final diagnoses:  Seizure disorder [facility])    Rx / DC Orders ED Discharge Orders     None         Arby Barrette, MD 03/05/23 2001

## 2023-03-23 DIAGNOSIS — R32 Unspecified urinary incontinence: Secondary | ICD-10-CM | POA: Diagnosis not present

## 2023-03-23 DIAGNOSIS — Z9689 Presence of other specified functional implants: Secondary | ICD-10-CM | POA: Diagnosis not present

## 2023-03-23 DIAGNOSIS — G478 Other sleep disorders: Secondary | ICD-10-CM | POA: Diagnosis not present

## 2023-03-23 DIAGNOSIS — R0681 Apnea, not elsewhere classified: Secondary | ICD-10-CM | POA: Diagnosis not present

## 2023-03-23 DIAGNOSIS — R0683 Snoring: Secondary | ICD-10-CM | POA: Diagnosis not present

## 2023-03-23 DIAGNOSIS — Z8669 Personal history of other diseases of the nervous system and sense organs: Secondary | ICD-10-CM | POA: Diagnosis not present

## 2023-04-03 DIAGNOSIS — R0902 Hypoxemia: Secondary | ICD-10-CM | POA: Diagnosis not present

## 2023-04-05 ENCOUNTER — Emergency Department (HOSPITAL_COMMUNITY): Payer: Medicare HMO

## 2023-04-05 ENCOUNTER — Other Ambulatory Visit: Payer: Self-pay

## 2023-04-05 ENCOUNTER — Encounter (HOSPITAL_COMMUNITY): Payer: Self-pay

## 2023-04-05 ENCOUNTER — Emergency Department (HOSPITAL_COMMUNITY)
Admission: EM | Admit: 2023-04-05 | Discharge: 2023-04-05 | Disposition: A | Discharge status: Home / Self Care | Payer: Medicare HMO | Attending: Emergency Medicine | Admitting: Emergency Medicine

## 2023-04-05 DIAGNOSIS — R9431 Abnormal electrocardiogram [ECG] [EKG]: Secondary | ICD-10-CM | POA: Diagnosis not present

## 2023-04-05 DIAGNOSIS — R569 Unspecified convulsions: Secondary | ICD-10-CM | POA: Diagnosis not present

## 2023-04-05 DIAGNOSIS — W19XXXA Unspecified fall, initial encounter: Secondary | ICD-10-CM | POA: Diagnosis not present

## 2023-04-05 LAB — CBG MONITORING, ED: Glucose-Capillary: 101 mg/dL — ABNORMAL HIGH (ref 70–99)

## 2023-04-05 MED ORDER — LEVETIRACETAM IN NACL 1000 MG/100ML IV SOLN: 1000.0000 mg | Freq: Once | INTRAVENOUS | Status: DC

## 2023-04-05 NOTE — Discharge Instructions (Addendum)
 You did not want blood work or IV placement here for Keppra.  I would make sure to follow-up with the neurologist.  Return for new or worsening symptoms

## 2023-04-05 NOTE — ED Provider Notes (Signed)
 [place] EMERGENCY DEPARTMENT AT [facility] Provider Note   CSN: [id number] Arrival date & time: 04/05/23  1922    History  Chief Complaint  Patient presents with   Seizures    Adam [name] is a 43 y.o. male here for here for evaluation of seizure-like activity.  History of epilepsy, follows with awake.  Witnessed by his wife.  Apparently had 2 focal seizures.  He is unsure entirely what happened.  There is a minute apart.  He had a fall to the ground and suffered some abrasions to his forehead.  States he takes Keppra, Vimpat.  Has not missed any doses.  Patient does report that even though he has compliance with his medications that he will have intermittent breakthrough seizure.  Has been otherwise well.  No URI symptoms, nausea, vomiting, diarrhea.  No sick contacts. No HA. No tongue injury, incontinence, postictal period per patient. Tetanus up to date.  HPI     Home Medications Prior to Admission medications   Medication Sig Start Date End Date Taking? Authorizing Provider  cloBAZam (ONFI) 20 MG tablet Take 10 mg by mouth 2 (two) times daily. 11/02/20   [provider]  clonazePAM (KLONOPIN) 0.5 MG disintegrating tablet Take 0.5 mg by mouth daily as needed for seizure. 09/20/20   [provider]  Lacosamide (VIMPAT) 100 MG TABS Take 3 tablets (300 mg total) by mouth 2 (two) times daily. 03/26/21 04/25/21  Kathlen Mody, MD  levETIRAcetam (KEPPRA) 1000 MG tablet Take 2,500 mg by mouth 2 (two) times daily. 10/27/18   [provider]  LORazepam (ATIVAN) 2 MG/ML injection Inject 1 mL (2 mg total) into the muscle once as needed for up to 1 dose for seizure. 09/18/20   Koleen Distance, MD  pyridoxine (B-6) 100 MG tablet Take 100 mg by mouth daily.    [provider]  sertraline (ZOLOFT) 50 MG tablet Take 50 mg by mouth daily. 11/15/20   [provider]  triamcinolone lotion (KENALOG) 0.1 % Apply 1 application. topically 3 (three) times  daily as needed (itching.). Do not apply to face. 06/17/21   Arthor Captain, PA-C  zonisamide (ZONEGRAN) 100 MG capsule Take 200 mg by mouth daily. 03/06/21   [provider]      Allergies    Patient has no known allergies.    Review of Systems   Review of Systems  Constitutional: Negative.   HENT: Negative.    Respiratory: Negative.    Cardiovascular: Negative.   Gastrointestinal: Negative.   Genitourinary: Negative.   Musculoskeletal: Negative.   Skin:  Positive for wound.  Neurological:  Positive for seizures. Negative for dizziness, tremors, syncope, facial asymmetry, speech difficulty, weakness, light-headedness, numbness and headaches.  All other systems reviewed and are negative.   Physical Exam Updated Vital Signs BP 124/73 (BP Location: Left Arm)   Pulse 67   Temp 98.5 F (36.9 C)   Resp 18   Ht 5\' 9"  (1.753 m)   Wt 63.5 kg   SpO2 100%   BMI 20.67 kg/m  Physical Exam Physical Exam  Constitutional: Pt is oriented to person, place, and time. Pt appears well-developed and well-nourished. No distress.  HENT:  Head: Normocephalic and atraumatic.  Mouth/Throat: Oropharynx is clear and moist.  Eyes: Conjunctivae and EOM are normal. Pupils are equal, round, and reactive to light. No scleral icterus.  No horizontal, vertical or rotational nystagmus  Neck: Normal range of motion. Neck supple.  Full active and passive  ROM without pain No midline or paraspinal tenderness No nuchal rigidity or meningeal signs  Cardiovascular: Normal rate, regular rhythm and intact distal pulses.   Pulmonary/Chest: Effort normal and breath sounds normal. No respiratory distress. Pt has no wheezes. No rales.  Abdominal: Soft. Bowel sounds are normal. There is no tenderness. There is no rebound and no guarding.  Musculoskeletal: Normal range of motion.  Lymphadenopathy:    No cervical adenopathy.  Neurological: Pt. is alert and oriented to person, place, and time. He has normal  reflexes. No cranial nerve deficit.  Exhibits normal muscle tone. Coordination normal.  Mental Status:  Alert, oriented, thought content appropriate. Speech fluent without evidence of aphasia. Able to follow 2 step commands without difficulty.  Cranial Nerves:  2-12 grossly intact Motor:  Equal strength Sensory: intact sensation Cerebellar: normal fF2N Gait: normal gait and balance CV: distal pulses palpable throughout   Skin: Skin is warm and dry. No rash noted. Pt is not diaphoretic.  Psychiatric: Pt has a normal mood and affect. Behavior is normal. Judgment and thought content normal.  Nursing note and vitals reviewed.  ED Results / Procedures / Treatments   Labs (all labs ordered are listed, but only abnormal results are displayed) Labs Reviewed  CBG MONITORING, ED - Abnormal; Notable for the following components:      Result Value   Glucose-Capillary 101 (*)    All other components within normal limits  CBC WITH DIFFERENTIAL/PLATELET  BASIC METABOLIC PANEL  URINALYSIS, W/ REFLEX TO CULTURE (INFECTION SUSPECTED)  LEVETIRACETAM LEVEL    EKG None  Radiology CT HEAD WO CONTRAST ( ) Result Date: 04/05/2023 CLINICAL DATA:  Seizure, forehead injury EXAM: CT HEAD WITHOUT CONTRAST CT MAXILLOFACIAL WITHOUT CONTRAST TECHNIQUE: Multidetector CT imaging of the head and maxillofacial structures were performed using the standard protocol without intravenous contrast. Multiplanar CT image reconstructions of the maxillofacial structures were also generated. RADIATION DOSE REDUCTION: This exam was performed according to the departmental dose-optimization program which includes automated exposure control, adjustment of the mA and/or kV according to patient size and/or use of iterative reconstruction technique. COMPARISON:  Head CT dated 11/05/2022 FINDINGS: CT HEAD FINDINGS Brain: No evidence of acute infarction, hemorrhage, hydrocephalus, extra-axial collection or mass lesion/mass effect.  Vascular: No hyperdense vessel or unexpected calcification. Skull: Postsurgical changes related to prior right frontotemporal burr hole. No fracture or focal lesion. Other: None. CT MAXILLOFACIAL FINDINGS Osseous: No evidence of maxillofacial fracture. Nasal bones are intact. Mandible is intact. Bilateral mandibular condyles are well-seated in the TMJs. Orbits: The bilateral orbits, including the globes and retroconal soft tissues, within normal limits. Sinuses: Status post FESS. Partial opacification of the bilateral maxillary sinuses. Mastoid air cells are clear. Soft tissues: Negative. IMPRESSION: No acute intracranial abnormality. No evidence of maxillofacial fracture. Electronically Signed   By: Charline Bills M.D.   On: 04/05/2023 21:31   CT Maxillofacial Wo Contrast Result Date: 04/05/2023 CLINICAL DATA:  Seizure, forehead injury EXAM: CT HEAD WITHOUT CONTRAST CT MAXILLOFACIAL WITHOUT CONTRAST TECHNIQUE: Multidetector CT imaging of the head and maxillofacial structures were performed using the standard protocol without intravenous contrast. Multiplanar CT image reconstructions of the maxillofacial structures were also generated. RADIATION DOSE REDUCTION: This exam was performed according to the departmental dose-optimization program which includes automated exposure control, adjustment of the mA and/or kV according to patient size and/or use of iterative reconstruction technique. COMPARISON:  Head CT dated 11/05/2022 FINDINGS: CT HEAD FINDINGS Brain: No evidence of acute infarction, hemorrhage, hydrocephalus, extra-axial collection or mass lesion/mass effect.  Vascular: No hyperdense vessel or unexpected calcification. Skull: Postsurgical changes related to prior right frontotemporal burr hole. No fracture or focal lesion. Other: None. CT MAXILLOFACIAL FINDINGS Osseous: No evidence of maxillofacial fracture. Nasal bones are intact. Mandible is intact. Bilateral mandibular condyles are well-seated in the  TMJs. Orbits: The bilateral orbits, including the globes and retroconal soft tissues, within normal limits. Sinuses: Status post FESS. Partial opacification of the bilateral maxillary sinuses. Mastoid air cells are clear. Soft tissues: Negative. IMPRESSION: No acute intracranial abnormality. No evidence of maxillofacial fracture. Electronically Signed   By: Charline Bills M.D.   On: 04/05/2023 21:31    Procedures Procedures    Medications Ordered in ED Medications  levETIRAcetam (KEPPRA) IVPB 1000 mg/100 mL premix (has no administration in time range)    ED Course/ Medical Decision Making/ A&P   43 year old with known history of epilepsy here for evaluation of witnessed seizure-like activity.  Apparently 2 episodes of focal seizures at home.  He did have a head injury after one of the episodes where he fell to the ground.  He has abrasions to his forehead.  No lacerations to suture.  He admits to compliance with his medication however does occasionally have breakthrough seizures despite compliance.  He denies any recent illnesses.  No head trauma prior to seizures.  He denies any tongue or mouth injury, incontinence or postictal.  According to patient.  Will plan on Keppra load, check labs, CT head and face.  Labs and imaging personally viewed and interpreted:   Was going to order labs as well as load of Keppra.  Patient and family do not want any labs or IVs placed.  We discussed risk versus benefit.  They voiced understanding.  Patient will be leaving AGAINST MEDICAL ADVICE  We discussed the nature and purpose, risks and benefits, as well as, the alternatives of treatment. Time was given to allow the opportunity to ask questions and consider their options, and after the discussion, the patient decided to refuse the offerred treatment. The patient was informed that refusal could lead to, but was not limited to, death, permanent disability, or severe pain. If present, I asked the  relatives or significant others to dissuade them without success. Prior to refusing, I determined that the patient had the capacity to make their decision and understood the consequences of that decision. After refusal, I made every reasonable opportunity to treat them to the best of my ability.  The patient was notified that they may return to the emergency department at any time for further treatment.                                   Medical Decision Making Amount and/or Complexity of Data Reviewed Independent Historian: spouse and EMS External Data Reviewed: labs, radiology and notes. Labs: ordered. Decision-making details documented in ED Course. Radiology: ordered and independent interpretation performed. Decision-making details documented in ED Course.  Risk Prescription drug management.          Final Clinical Impression(s) / ED Diagnoses Final diagnoses:  Seizure-like activity [facility])    Rx / DC Orders ED Discharge Orders     None         Kreston Ahrendt A, PA-C 04/05/23 2202    Rexford Maus, DO 04/05/23 2352

## 2023-04-05 NOTE — ED Triage Notes (Signed)
 Pt BIB [facility] from home c/o a seizure witness by his wife. Per wife they were focus seizures with 2 happening back to back with a minute apart. Pt has some abrasions on his head from the seizure. Pt does take Keppra with no missed doses.

## 2023-04-05 NOTE — ED Notes (Signed)
 Family declines IV and all labs at this time

## 2023-04-05 NOTE — Progress Notes (Signed)
 Responded to consult for IV. Pt/visitor stated pt is usually discharged after similar circumstances and wants to clarify if IV is needed. Discussed with RN who also spoke with pt and MD. Agreed on plan is to hold on IV placement; per RN, MD will follow up with discharge plans.

## 2023-04-06 DIAGNOSIS — R0902 Hypoxemia: Secondary | ICD-10-CM | POA: Diagnosis not present

## 2023-07-03 DIAGNOSIS — Z9682 Presence of neurostimulator: Secondary | ICD-10-CM | POA: Diagnosis not present

## 2023-07-03 DIAGNOSIS — G40119 Localization-related (focal) (partial) symptomatic epilepsy and epileptic syndromes with simple partial seizures, intractable, without status epilepticus: Secondary | ICD-10-CM | POA: Diagnosis not present

## 2023-08-27 DIAGNOSIS — R400 Somnolence: Secondary | ICD-10-CM | POA: Diagnosis not present

## 2023-08-27 DIAGNOSIS — R9431 Abnormal electrocardiogram [ECG] [EKG]: Secondary | ICD-10-CM | POA: Diagnosis not present

## 2023-08-27 DIAGNOSIS — R4182 Altered mental status, unspecified: Secondary | ICD-10-CM | POA: Diagnosis not present

## 2023-08-27 DIAGNOSIS — G40109 Localization-related (focal) (partial) symptomatic epilepsy and epileptic syndromes with simple partial seizures, not intractable, without status epilepticus: Secondary | ICD-10-CM | POA: Diagnosis not present

## 2023-08-27 DIAGNOSIS — G40802 Other epilepsy, not intractable, without status epilepticus: Secondary | ICD-10-CM | POA: Diagnosis not present

## 2023-08-27 DIAGNOSIS — Z8661 Personal history of infections of the central nervous system: Secondary | ICD-10-CM | POA: Diagnosis not present

## 2023-08-27 DIAGNOSIS — R000 Tachycardia, unspecified: Secondary | ICD-10-CM | POA: Diagnosis not present

## 2023-08-27 DIAGNOSIS — G40909 Epilepsy, unspecified, not intractable, without status epilepticus: Secondary | ICD-10-CM | POA: Diagnosis not present

## 2023-08-27 DIAGNOSIS — Z87891 Personal history of nicotine dependence: Secondary | ICD-10-CM | POA: Diagnosis not present

## 2023-08-27 DIAGNOSIS — G40919 Epilepsy, unspecified, intractable, without status epilepticus: Secondary | ICD-10-CM | POA: Diagnosis not present

## 2023-08-27 DIAGNOSIS — R569 Unspecified convulsions: Secondary | ICD-10-CM | POA: Diagnosis not present

## 2023-08-27 DIAGNOSIS — R404 Transient alteration of awareness: Secondary | ICD-10-CM | POA: Diagnosis not present

## 2023-09-03 DIAGNOSIS — F331 Major depressive disorder, recurrent, moderate: Secondary | ICD-10-CM | POA: Diagnosis not present

## 2023-09-03 DIAGNOSIS — F445 Conversion disorder with seizures or convulsions: Secondary | ICD-10-CM | POA: Diagnosis not present

## 2023-09-03 DIAGNOSIS — F411 Generalized anxiety disorder: Secondary | ICD-10-CM | POA: Diagnosis not present

## 2023-09-07 DIAGNOSIS — F445 Conversion disorder with seizures or convulsions: Secondary | ICD-10-CM | POA: Diagnosis not present

## 2023-09-07 DIAGNOSIS — F411 Generalized anxiety disorder: Secondary | ICD-10-CM | POA: Diagnosis not present

## 2023-09-07 DIAGNOSIS — F331 Major depressive disorder, recurrent, moderate: Secondary | ICD-10-CM | POA: Diagnosis not present

## 2023-09-14 DIAGNOSIS — F411 Generalized anxiety disorder: Secondary | ICD-10-CM | POA: Diagnosis not present

## 2023-09-14 DIAGNOSIS — F331 Major depressive disorder, recurrent, moderate: Secondary | ICD-10-CM | POA: Diagnosis not present

## 2023-09-14 DIAGNOSIS — F445 Conversion disorder with seizures or convulsions: Secondary | ICD-10-CM | POA: Diagnosis not present

## 2023-10-22 DIAGNOSIS — F445 Conversion disorder with seizures or convulsions: Secondary | ICD-10-CM | POA: Diagnosis not present

## 2023-10-22 DIAGNOSIS — F331 Major depressive disorder, recurrent, moderate: Secondary | ICD-10-CM | POA: Diagnosis not present

## 2023-10-22 DIAGNOSIS — F411 Generalized anxiety disorder: Secondary | ICD-10-CM | POA: Diagnosis not present

## 2023-11-05 DIAGNOSIS — F411 Generalized anxiety disorder: Secondary | ICD-10-CM | POA: Diagnosis not present

## 2023-11-05 DIAGNOSIS — F445 Conversion disorder with seizures or convulsions: Secondary | ICD-10-CM | POA: Diagnosis not present

## 2023-11-05 DIAGNOSIS — F331 Major depressive disorder, recurrent, moderate: Secondary | ICD-10-CM | POA: Diagnosis not present

## 2023-11-18 DIAGNOSIS — F445 Conversion disorder with seizures or convulsions: Secondary | ICD-10-CM | POA: Diagnosis not present

## 2023-11-18 DIAGNOSIS — F411 Generalized anxiety disorder: Secondary | ICD-10-CM | POA: Diagnosis not present

## 2023-11-18 DIAGNOSIS — F331 Major depressive disorder, recurrent, moderate: Secondary | ICD-10-CM | POA: Diagnosis not present

## 2023-11-20 DIAGNOSIS — G40911 Epilepsy, unspecified, intractable, with status epilepticus: Secondary | ICD-10-CM | POA: Diagnosis not present

## 2023-11-25 DIAGNOSIS — G40909 Epilepsy, unspecified, not intractable, without status epilepticus: Secondary | ICD-10-CM | POA: Diagnosis not present

## 2023-11-25 DIAGNOSIS — Z79899 Other long term (current) drug therapy: Secondary | ICD-10-CM | POA: Diagnosis not present

## 2023-11-25 DIAGNOSIS — R410 Disorientation, unspecified: Secondary | ICD-10-CM | POA: Diagnosis not present

## 2023-11-25 DIAGNOSIS — R569 Unspecified convulsions: Secondary | ICD-10-CM | POA: Diagnosis not present

## 2023-12-10 DIAGNOSIS — F331 Major depressive disorder, recurrent, moderate: Secondary | ICD-10-CM | POA: Diagnosis not present

## 2023-12-10 DIAGNOSIS — F445 Conversion disorder with seizures or convulsions: Secondary | ICD-10-CM | POA: Diagnosis not present

## 2023-12-10 DIAGNOSIS — F411 Generalized anxiety disorder: Secondary | ICD-10-CM | POA: Diagnosis not present

## 2023-12-25 DIAGNOSIS — F445 Conversion disorder with seizures or convulsions: Secondary | ICD-10-CM | POA: Diagnosis not present

## 2023-12-25 DIAGNOSIS — F411 Generalized anxiety disorder: Secondary | ICD-10-CM | POA: Diagnosis not present

## 2023-12-25 DIAGNOSIS — F331 Major depressive disorder, recurrent, moderate: Secondary | ICD-10-CM | POA: Diagnosis not present
